# Patient Record
Sex: Female | Born: 1995 | Race: White | Hispanic: No | Marital: Married | Smoking: Former smoker
Health system: Southern US, Community
[De-identification: ages and names within clinical notes are randomized; demographics above are authoritative.]

## PROBLEM LIST (undated history)

## (undated) ENCOUNTER — Inpatient Hospital Stay (HOSPITAL_COMMUNITY): Payer: Self-pay

## (undated) DIAGNOSIS — D696 Thrombocytopenia, unspecified: Secondary | ICD-10-CM

## (undated) DIAGNOSIS — O99119 Other diseases of the blood and blood-forming organs and certain disorders involving the immune mechanism complicating pregnancy, unspecified trimester: Secondary | ICD-10-CM

## (undated) DIAGNOSIS — F419 Anxiety disorder, unspecified: Secondary | ICD-10-CM

## (undated) HISTORY — DX: Other diseases of the blood and blood-forming organs and certain disorders involving the immune mechanism complicating pregnancy, unspecified trimester: D69.6

## (undated) HISTORY — PX: NO PAST SURGERIES: SHX2092

## (undated) HISTORY — DX: Thrombocytopenia, unspecified: O99.119

## (undated) HISTORY — DX: Anxiety disorder, unspecified: F41.9

---
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
## 2013-05-25 ENCOUNTER — Encounter (HOSPITAL_COMMUNITY): Payer: Self-pay | Admitting: Emergency Medicine

## 2013-05-25 ENCOUNTER — Emergency Department (HOSPITAL_COMMUNITY)
Admission: EM | Admit: 2013-05-25 | Discharge: 2013-05-25 | Disposition: A | Discharge status: Home / Self Care | Payer: Medicaid Other | Attending: Emergency Medicine | Admitting: Emergency Medicine

## 2013-05-25 DIAGNOSIS — Z3202 Encounter for pregnancy test, result negative: Secondary | ICD-10-CM | POA: Insufficient documentation

## 2013-05-25 DIAGNOSIS — Z880 Allergy status to penicillin: Secondary | ICD-10-CM | POA: Insufficient documentation

## 2013-05-25 DIAGNOSIS — Z8659 Personal history of other mental and behavioral disorders: Secondary | ICD-10-CM | POA: Insufficient documentation

## 2013-05-25 DIAGNOSIS — F17200 Nicotine dependence, unspecified, uncomplicated: Secondary | ICD-10-CM | POA: Insufficient documentation

## 2013-05-25 DIAGNOSIS — R300 Dysuria: Secondary | ICD-10-CM | POA: Insufficient documentation

## 2013-05-25 DIAGNOSIS — N898 Other specified noninflammatory disorders of vagina: Secondary | ICD-10-CM | POA: Insufficient documentation

## 2013-05-25 DIAGNOSIS — T384X5A Adverse effect of oral contraceptives, initial encounter: Secondary | ICD-10-CM | POA: Insufficient documentation

## 2013-05-25 LAB — POCT PREGNANCY, URINE: Preg Test, Ur: NEGATIVE

## 2013-05-25 LAB — URINALYSIS, ROUTINE W REFLEX MICROSCOPIC
Bilirubin Urine: NEGATIVE
Glucose, UA: NEGATIVE mg/dL
Hgb urine dipstick: NEGATIVE
Ketones, ur: 15 mg/dL — AB
Leukocytes, UA: NEGATIVE
Nitrite: NEGATIVE
Protein, ur: NEGATIVE mg/dL
pH: 5.5 (ref 5.0–8.0)

## 2013-05-25 MED ORDER — IBUPROFEN 800 MG PO TABS
800.0000 mg | ORAL_TABLET | Freq: Once | ORAL | Status: AC
  Administered 2013-05-25: 800 mg via ORAL
  Filled 2013-05-25: qty 1

## 2013-05-25 MED ORDER — CIPROFLOXACIN HCL 500 MG PO TABS: 500.0000 mg | ORAL_TABLET | Freq: Two times a day (BID) | ORAL | Status: DC

## 2013-05-25 NOTE — ED Notes (Addendum)
abd pain, dysuria,vomiting., no fever or chills,  LMP now.  4 mos post partum.  Vag delivery.  Has had vag bleeding since received  Depo shot in Oct.

## 2013-05-25 NOTE — ED Provider Notes (Signed)
CSN: [id number]     Arrival date & time 05/25/13  1050 History  This chart was scribed for Benny Lennert, MD by Bennett Scrape, ED Scribe. This patient was seen in room APA01/APA01 and the patient's care was started at 12:04 PM.   Chief Complaint  Patient presents with  . Abdominal Pain    Patient is a 17 y.o. female presenting with abdominal pain. The history is provided by the patient. No language interpreter was used.  Abdominal Pain Pain location:  Suprapubic Pain radiates to:  Does not radiate Onset quality:  Gradual Duration: this morning. Timing:  Constant Chronicity:  New Associated symptoms: dysuria and vaginal bleeding   Associated symptoms: no chest pain, no chills, no cough, no diarrhea, no fatigue, no fever and no hematuria     HPI Comments: Marissa Friedman is a 17 y.o. female who presents to the Emergency Department complaining of constant suprapubic abdominal pain with associated dysuria and difficulty urinating that started this morning. She reports prior episodes of the same diagnosed as UTIs. She denies any fevers or chills. She recently had a vaginal birth 4 months ago. She denies any post delivery complications. She is currently on the depo injections but is having vaginal bleeding from it since last month.   Past Medical History  Diagnosis Date  . Anxiety    History reviewed. No pertinent past surgical history. Family History  Problem Relation Age of Onset  . Diabetes Other   . Hypertension Other   . Mental illness Other   . Stroke Other   . Coronary artery disease Other    History  Substance Use Topics  . Smoking status: Current Every Day Smoker -- 0.50 packs/day    Types: Cigarettes  . Smokeless tobacco: Not on file     Comment: Decreased to 3 cigarettes a day.  . Alcohol Use: No     Comment: occasional. None since +preg test.   OB History   Grav Para Term Preterm Abortions TAB SAB Ect Mult Living   1              Review of Systems   Constitutional: Negative for fever, chills, appetite change and fatigue.  HENT: Negative for congestion, ear discharge and sinus pressure.   Eyes: Negative for discharge.  Respiratory: Negative for cough.   Cardiovascular: Negative for chest pain.  Gastrointestinal: Positive for abdominal pain. Negative for diarrhea.  Genitourinary: Positive for dysuria, vaginal bleeding and difficulty urinating. Negative for frequency and hematuria.  Musculoskeletal: Negative for back pain.  Skin: Negative for rash.  Neurological: Negative for seizures and headaches.  Psychiatric/Behavioral: Negative for hallucinations.    Allergies  Penicillins and Wellbutrin  Home Medications   Current Outpatient Rx  Name  Route  Sig  Dispense  Refill  . Prenatal Vit-Fe Fumarate-FA (PRENATAL VITAMINS) 28-0.8 MG TABS   Oral   Take 1 tablet by mouth daily.   30 tablet   3   . EXPIRED: promethazine (PHENERGAN) 25 MG tablet   Oral   Take 1 tablet (25 mg total) by mouth every 6 (six) hours as needed for nausea.   12 tablet   0    Triage Vitals: BP 101/58  Pulse 73  Temp(Src) 98 F (36.7 C) (Oral)  Resp 20  Ht 5\' 4"  (1.626 m)  Wt 130 lb (58.968 kg)  BMI 22.30 kg/m2  SpO2 93%  LMP 04/24/2013  Breastfeeding? Unknown  Physical Exam  Nursing note and vitals reviewed. Constitutional: She  is oriented to person, place, and time. She appears well-developed and well-nourished.  HENT:  Head: Normocephalic and atraumatic.  Eyes: Conjunctivae and EOM are normal. No scleral icterus.  Neck: Neck supple. No thyromegaly present.  Cardiovascular: Normal rate and regular rhythm.  Exam reveals no gallop and no friction rub.   No murmur heard. Pulmonary/Chest: Effort normal and breath sounds normal. No stridor. She has no wheezes. She has no rales. She exhibits no tenderness.  Abdominal: She exhibits no distension. There is tenderness (mild suprapubic tenderness ). There is no rebound.  Musculoskeletal: Normal  range of motion. She exhibits no edema.  Lymphadenopathy:    She has no cervical adenopathy.  Neurological: She is alert and oriented to person, place, and time. She exhibits normal muscle tone. Coordination normal.  Skin: Skin is warm and dry. No rash noted. No erythema.  Psychiatric: She has a normal mood and affect. Her behavior is normal.    ED Course  Procedures (including critical care time)  DIAGNOSTIC STUDIES: Oxygen Saturation is 93% on RA, adequate by my interpretation.    COORDINATION OF CARE: 12:05 PM-Discussed treatment plan which includes UA with pt at bedside and pt agreed to plan.   Labs Review Labs Reviewed - No data to display Imaging Review No results found.  EKG Interpretation   None       MDM  Dysuria,   Will tx with cipro and have her follow up with gyn      The chart was scribed for me under my direct supervision.  I personally performed the history, physical, and medical decision making and all procedures in the evaluation of this patient.Benny Lennert, MD 05/25/13 [phone]

## 2013-05-25 NOTE — ED Notes (Signed)
Reports onset of suprapubic pain, burning with urination, onset this morning; also reports n/v onset this morning - states, "I was throwing up acid".  States suprapubic pain is constant, and rates 9/10 presently. Also reports vaginal bleeding since October of this year after receiving Depo shot.

## 2013-05-27 LAB — URINE CULTURE: Colony Count: 10000

## 2013-05-28 ENCOUNTER — Telehealth (HOSPITAL_COMMUNITY): Payer: Self-pay | Admitting: Emergency Medicine

## 2013-05-28 NOTE — ED Notes (Signed)
Post ED Visit - Positive Culture Follow-up  Culture report reviewed by antimicrobial stewardship pharmacist: []  Wes Dulaney, Pharm.D., BCPS []  Celedonio Miyamoto, Pharm.D., BCPS [x]  Georgina Pillion, Pharm.D., BCPS []  [place], [place].D., BCPS, AAHIVP []  Estella Husk, Pharm.D., BCPS, AAHIVP  Positive urine culture Treated with Cipro, organism sensitive to the same and no further patient follow-up is required at this time.  Kylie A Holland 05/28/2013, 11:39 AM

## 2013-09-14 ENCOUNTER — Emergency Department (HOSPITAL_COMMUNITY)
Admission: EM | Admit: 2013-09-14 | Discharge: 2013-09-14 | Disposition: A | Discharge status: Home / Self Care | Payer: Medicaid Other | Attending: Emergency Medicine | Admitting: Emergency Medicine

## 2013-09-14 ENCOUNTER — Encounter (HOSPITAL_COMMUNITY): Payer: Self-pay | Admitting: Emergency Medicine

## 2013-09-14 DIAGNOSIS — Z8659 Personal history of other mental and behavioral disorders: Secondary | ICD-10-CM | POA: Insufficient documentation

## 2013-09-14 DIAGNOSIS — Z3202 Encounter for pregnancy test, result negative: Secondary | ICD-10-CM | POA: Insufficient documentation

## 2013-09-14 DIAGNOSIS — Z880 Allergy status to penicillin: Secondary | ICD-10-CM | POA: Insufficient documentation

## 2013-09-14 DIAGNOSIS — F17200 Nicotine dependence, unspecified, uncomplicated: Secondary | ICD-10-CM | POA: Insufficient documentation

## 2013-09-14 DIAGNOSIS — N390 Urinary tract infection, site not specified: Secondary | ICD-10-CM | POA: Insufficient documentation

## 2013-09-14 LAB — URINE MICROSCOPIC-ADD ON

## 2013-09-14 LAB — URINALYSIS, ROUTINE W REFLEX MICROSCOPIC
Bilirubin Urine: NEGATIVE
Glucose, UA: NEGATIVE mg/dL
Hgb urine dipstick: NEGATIVE
Ketones, ur: 15 mg/dL — AB
Nitrite: POSITIVE — AB
Specific Gravity, Urine: >1.03 — ABNORMAL HIGH (ref 1.005–1.030)
Urobilinogen, UA: 2 mg/dL — ABNORMAL HIGH (ref 0.0–1.0)
pH: 5.5 (ref 5.0–8.0)

## 2013-09-14 LAB — PREGNANCY, URINE: Preg Test, Ur: NEGATIVE

## 2013-09-14 MED ORDER — IBUPROFEN 800 MG PO TABS
ORAL_TABLET | ORAL | Status: AC
  Filled 2013-09-14: qty 1

## 2013-09-14 MED ORDER — CIPROFLOXACIN HCL 250 MG PO TABS
ORAL_TABLET | ORAL | Status: AC
  Administered 2013-09-14: 500 mg
  Filled 2013-09-14: qty 2

## 2013-09-14 MED ORDER — CIPROFLOXACIN HCL 500 MG PO TABS: 500.0000 mg | ORAL_TABLET | Freq: Two times a day (BID) | ORAL | Status: DC

## 2013-09-14 MED ORDER — IBUPROFEN 800 MG PO TABS
800.0000 mg | ORAL_TABLET | Freq: Once | ORAL | Status: AC
  Administered 2013-09-14: 800 mg via ORAL

## 2013-09-14 NOTE — ED Provider Notes (Signed)
CSN: [id number]     Arrival date & time 09/14/13  2044 History   First MD Initiated Contact with Patient 09/14/13 2114     Chief Complaint  Patient presents with  . Generalized Body Aches     (Consider location/radiation/quality/duration/timing/severity/associated sxs/prior Treatment) Patient is a 18 y.o. female presenting with dysuria. The history is provided by the patient. No language interpreter was used.  Dysuria Pain quality:  Aching Pain severity:  Moderate Progression:  Waxing and waning Associated symptoms: no fever, no nausea and no vomiting   Associated symptoms comment:  Suprapubic pain Risk factors: recurrent urinary tract infections    Patient is a 18 year old female who presents with lower abdominal pain. She reports that she hurts all across her lower abdomen but mostly over the area of her bladder. She reports that she has had recurrent urinary tract infections before and she is having some pain into her left back area. She denies fever, chills, nausea or vomiting. She reports that she had similar symptoms before and whatever they gave her the last time worked.   Past Medical History  Diagnosis Date  . Anxiety    History reviewed. No pertinent past surgical history. Family History  Problem Relation Age of Onset  . Diabetes Other   . Hypertension Other   . Mental illness Other   . Stroke Other   . Coronary artery disease Other    History  Substance Use Topics  . Smoking status: Current Every Day Smoker -- 0.50 packs/day    Types: Cigarettes  . Smokeless tobacco: Not on file     Comment: Decreased to 3 cigarettes a day.  . Alcohol Use: No     Comment: occasional. None since +preg test.   OB History   Grav Para Term Preterm Abortions TAB SAB Ect Mult Living   1              Review of Systems  Constitutional: Negative for fever.  Gastrointestinal: Negative for nausea and vomiting.  Genitourinary: Positive for dysuria.  Musculoskeletal: Positive for  back pain.  Skin: Negative for rash.  All other systems reviewed and are negative.      Allergies  Penicillins and Wellbutrin  Home Medications   Current Outpatient Rx  Name  Route  Sig  Dispense  Refill  . ciprofloxacin (CIPRO) 500 MG tablet   Oral   Take 1 tablet (500 mg total) by mouth 2 (two) times daily.   14 tablet   0    BP 117/64  Temp(Src) 99.8 F (37.7 C) (Oral)  Resp 20  Ht 5\' 4"  (1.626 m)  Wt 140 lb (63.504 kg)  BMI 24.02 kg/m2  SpO2 97%  LMP 08/24/2013 Physical Exam  Nursing note and vitals reviewed. Constitutional: She is oriented to person, place, and time. She appears well-developed and well-nourished. No distress.  HENT:  Head: Normocephalic and atraumatic.  Eyes: Conjunctivae and EOM are normal.  Neck: Normal range of motion. Neck supple. No JVD present. No tracheal deviation present. No thyromegaly present.  Cardiovascular: Normal rate, regular rhythm and normal heart sounds.   Pulmonary/Chest: Effort normal and breath sounds normal. No respiratory distress. She has no wheezes.  Abdominal: Soft. Bowel sounds are normal. There is tenderness in the suprapubic area. There is no rebound, no guarding and no CVA tenderness.  Musculoskeletal: Normal range of motion.  Lymphadenopathy:    She has no cervical adenopathy.  Neurological: She is alert and oriented to person, place, and time. No  cranial nerve deficit. Coordination normal.  Skin: Skin is warm and dry. No rash noted. No erythema.  Psychiatric: She has a normal mood and affect. Her behavior is normal. Judgment and thought content normal.    ED Course  Procedures (including critical care time) Labs Review Labs Reviewed  URINALYSIS, ROUTINE W REFLEX MICROSCOPIC - Abnormal; Notable for the following:    Specific Gravity, Urine >1.030 (*)    Ketones, ur 15 (*)    Protein, ur TRACE (*)    Urobilinogen, UA 2.0 (*)    Nitrite POSITIVE (*)    Leukocytes, UA TRACE (*)    All other components  within normal limits  URINE MICROSCOPIC-ADD ON - Abnormal; Notable for the following:    Squamous Epithelial / LPF FEW (*)    Bacteria, UA FEW (*)    All other components within normal limits  PREGNANCY, URINE   Imaging Review No results found.   EKG Interpretation None      MDM   Final diagnoses:  UTI (lower urinary tract infection)    Denies fever, chills, nausea or vomiting. Feeling some relief with ibuprofen. Reports that this feels like a UTI. Urinalysis; Nitrite + with leukocytes. Will treat for UTI. Discussed plan of care with pt and she agrees. Prescription for Ciprofloxacin given. Return precautions given.      Irish EldersKelly Cortni Tays, NP 09/18/13 [phone]

## 2013-09-14 NOTE — Discharge Instructions (Signed)
Urinary Tract Infection Urinary tract infections (UTIs) can develop anywhere along your urinary tract. Your urinary tract is your body's drainage system for removing wastes and extra water. Your urinary tract includes two kidneys, two ureters, a bladder, and a urethra. Your kidneys are a pair of bean-shaped organs. Each kidney is about the size of your fist. They are located below your ribs, one on each side of your spine. CAUSES Infections are caused by microbes, which are microscopic organisms, including fungi, viruses, and bacteria. These organisms are so small that they can only be seen through a microscope. Bacteria are the microbes that most commonly cause UTIs. SYMPTOMS  Symptoms of UTIs may vary by age and gender of the patient and by the location of the infection. Symptoms in young women typically include a frequent and intense urge to urinate and a painful, burning feeling in the bladder or urethra during urination. Older women and men are more likely to be tired, shaky, and weak and have muscle aches and abdominal pain. A fever may mean the infection is in your kidneys. Other symptoms of a kidney infection include pain in your back or sides below the ribs, nausea, and vomiting. DIAGNOSIS To diagnose a UTI, your caregiver will ask you about your symptoms. Your caregiver also will ask to provide a urine sample. The urine sample will be tested for bacteria and white blood cells. White blood cells are made by your body to help fight infection. TREATMENT  Typically, UTIs can be treated with medication. Because most UTIs are caused by a bacterial infection, they usually can be treated with the use of antibiotics. The choice of antibiotic and length of treatment depend on your symptoms and the type of bacteria causing your infection. HOME CARE INSTRUCTIONS  If you were prescribed antibiotics, take them exactly as your caregiver instructs you. Finish the medication even if you feel better after you  have only taken some of the medication.  Drink enough water and fluids to keep your urine clear or pale yellow.  Avoid caffeine, tea, and carbonated beverages. They tend to irritate your bladder.  Empty your bladder often. Avoid holding urine for long periods of time.  Empty your bladder before and after sexual intercourse.  After a bowel movement, women should cleanse from front to back. Use each tissue only once. SEEK MEDICAL CARE IF:   You have back pain.  You develop a fever.  Your symptoms do not begin to resolve within 3 days. SEEK IMMEDIATE MEDICAL CARE IF:   You have severe back pain or lower abdominal pain.  You develop chills.  You have nausea or vomiting.  You have continued burning or discomfort with urination. MAKE SURE YOU:   Understand these instructions.  Will watch your condition.  Will get help right away if you are not doing well or get worse. Document Released: 03/28/2005 Document Revised: 12/18/2011 Document Reviewed: 07/27/2011 [facility] Patient Information 2014 [place], [place].   Increase oral fluid intake Drink cranberry juice Take antibiotic as prescribed May take ibuprofen or Tylenol for discomfort Return if fever, nausea, vomiting or worsening back pain

## 2013-09-14 NOTE — ED Notes (Signed)
Getting light headed. Hurting in my waist and in my left side, hurting all over, head hurts too. It comes and goes per pt. Thought it may be a uti but I am hurting all over now.

## 2013-09-19 NOTE — ED Provider Notes (Signed)
Medical screening examination/treatment/procedure(s) were performed by non-physician practitioner and as supervising physician I was immediately available for consultation/collaboration.   EKG Interpretation None       Donnetta HutchingBrian Jorey Dollard, MD 09/19/13 [phone]

## 2014-05-03 ENCOUNTER — Encounter (HOSPITAL_COMMUNITY): Payer: Self-pay | Admitting: Emergency Medicine

## 2014-05-15 ENCOUNTER — Emergency Department (HOSPITAL_COMMUNITY)
Admission: EM | Admit: 2014-05-15 | Discharge: 2014-05-15 | Disposition: A | Discharge status: Home / Self Care | Payer: Medicaid Other | Attending: Emergency Medicine | Admitting: Emergency Medicine

## 2014-05-15 ENCOUNTER — Emergency Department (HOSPITAL_COMMUNITY): Payer: Medicaid Other

## 2014-05-15 DIAGNOSIS — Y9289 Other specified places as the place of occurrence of the external cause: Secondary | ICD-10-CM | POA: Insufficient documentation

## 2014-05-15 DIAGNOSIS — Y9389 Activity, other specified: Secondary | ICD-10-CM | POA: Insufficient documentation

## 2014-05-15 DIAGNOSIS — Y998 Other external cause status: Secondary | ICD-10-CM | POA: Diagnosis not present

## 2014-05-15 DIAGNOSIS — Z720 Tobacco use: Secondary | ICD-10-CM | POA: Insufficient documentation

## 2014-05-15 DIAGNOSIS — F419 Anxiety disorder, unspecified: Secondary | ICD-10-CM | POA: Diagnosis not present

## 2014-05-15 DIAGNOSIS — W458XXA Other foreign body or object entering through skin, initial encounter: Secondary | ICD-10-CM | POA: Insufficient documentation

## 2014-05-15 DIAGNOSIS — Z880 Allergy status to penicillin: Secondary | ICD-10-CM | POA: Diagnosis not present

## 2014-05-15 DIAGNOSIS — S90851S Superficial foreign body, right foot, sequela: Secondary | ICD-10-CM

## 2014-05-15 DIAGNOSIS — Z792 Long term (current) use of antibiotics: Secondary | ICD-10-CM | POA: Insufficient documentation

## 2014-05-15 DIAGNOSIS — S90851A Superficial foreign body, right foot, initial encounter: Secondary | ICD-10-CM | POA: Diagnosis present

## 2014-05-15 DIAGNOSIS — M795 Residual foreign body in soft tissue: Secondary | ICD-10-CM

## 2014-05-15 DIAGNOSIS — Z23 Encounter for immunization: Secondary | ICD-10-CM | POA: Diagnosis not present

## 2014-05-15 MED ORDER — LIDOCAINE HCL (PF) 1 % IJ SOLN: 10.0000 mL | Freq: Once | INTRAMUSCULAR | Status: DC

## 2014-05-15 MED ORDER — SULFAMETHOXAZOLE-TRIMETHOPRIM 800-160 MG PO TABS: 1.0000 | ORAL_TABLET | Freq: Two times a day (BID) | ORAL | Status: AC

## 2014-05-15 MED ORDER — DOXYCYCLINE HYCLATE 100 MG PO TABS
100.0000 mg | ORAL_TABLET | Freq: Once | ORAL | Status: AC
  Administered 2014-05-15: 100 mg via ORAL
  Filled 2014-05-15: qty 1

## 2014-05-15 MED ORDER — TETANUS-DIPHTH-ACELL PERTUSSIS 5-2.5-18.5 LF-MCG/0.5 IM SUSP
0.5000 mL | Freq: Once | INTRAMUSCULAR | Status: AC
  Administered 2014-05-15: 0.5 mL via INTRAMUSCULAR
  Filled 2014-05-15: qty 0.5

## 2014-05-15 MED ORDER — HYDROCODONE-ACETAMINOPHEN 5-325 MG PO TABS
2.0000 | ORAL_TABLET | Freq: Once | ORAL | Status: AC
  Administered 2014-05-15: 2 via ORAL
  Filled 2014-05-15: qty 2

## 2014-05-15 MED ORDER — PROMETHAZINE HCL 12.5 MG PO TABS
12.5000 mg | ORAL_TABLET | Freq: Once | ORAL | Status: AC
  Administered 2014-05-15: 12.5 mg via ORAL
  Filled 2014-05-15: qty 1

## 2014-05-15 MED ORDER — LIDOCAINE HCL (PF) 2 % IJ SOLN
INTRAMUSCULAR | Status: AC
  Administered 2014-05-15: 22:00:00
  Filled 2014-05-15: qty 10

## 2014-05-15 NOTE — ED Provider Notes (Signed)
CSN: [id number]     Arrival date & time 05/15/14  2051 History   First MD Initiated Contact with Patient 05/15/14 2059     Chief Complaint  Patient presents with  . Foreign Body     (Consider location/radiation/quality/duration/timing/severity/associated sxs/prior Treatment) HPI Comments: Patient is a 18 year old female who presents to the emergency department with a foreign body in the right foot.   Patient is a 18 y.o. female presenting with foreign body. The history is provided by the patient.  Foreign Body Location:  Skin (bottom of the right foot) Suspected object:  Metal Pain quality:  Sharp, shooting and throbbing Pain severity:  Severe Duration:  1 hour Timing:  Constant Progression:  Worsening Chronicity:  New Worsened by:  Nothing tried Ineffective treatments:  None tried Associated symptoms: no nausea   Risk factors: no prior similar events and no prior surgery to area     Past Medical History  Diagnosis Date  . Anxiety    No past surgical history on file. Family History  Problem Relation Age of Onset  . Diabetes Other   . Hypertension Other   . Mental illness Other   . Stroke Other   . Coronary artery disease Other    History  Substance Use Topics  . Smoking status: Current Every Day Smoker -- 0.50 packs/day    Types: Cigarettes  . Smokeless tobacco: Not on file     Comment: Decreased to 3 cigarettes a day.  . Alcohol Use: No     Comment: occasional. None since +preg test.   OB History    Gravida Para Term Preterm AB TAB SAB Ectopic Multiple Living   1              Review of Systems  Constitutional: Negative for activity change.       All ROS Neg except as noted in HPI  Eyes: Negative for photophobia and discharge.  Respiratory: Negative for shortness of breath and wheezing.   Cardiovascular: Negative for chest pain and palpitations.  Gastrointestinal: Negative for nausea and blood in stool.  Genitourinary: Negative for dysuria, frequency  and hematuria.  Musculoskeletal: Negative for back pain, arthralgias and neck pain.  Skin: Negative.   Neurological: Negative for dizziness, seizures, weakness and numbness.  Psychiatric/Behavioral: Negative for hallucinations and confusion. The patient is nervous/anxious.   All other systems reviewed and are negative.     Allergies  Penicillins and Wellbutrin  Home Medications   Prior to Admission medications   Medication Sig Start Date End Date Taking? Authorizing Provider  ciprofloxacin (CIPRO) 500 MG tablet Take 1 tablet (500 mg total) by mouth 2 (two) times daily. 09/14/13   Irish EldersKelly Walker, NP   BP 118/73 mmHg  Pulse 101  Temp(Src) 98.1 F (36.7 C) (Oral)  Resp 16  Ht 5\' 4"  (1.626 m)  Wt 155 lb (70.308 kg)  BMI 26.59 kg/m2  SpO2 99%  LMP 05/14/2014 Physical Exam  Constitutional: She is oriented to person, place, and time. She appears well-developed and well-nourished.  Non-toxic appearance.  HENT:  Head: Normocephalic.  Right Ear: Tympanic membrane and external ear normal.  Left Ear: Tympanic membrane and external ear normal.  Eyes: EOM and lids are normal. Pupils are equal, round, and reactive to light.  Neck: Normal range of motion. Neck supple. Carotid bruit is not present.  Cardiovascular: Normal rate, regular rhythm, normal heart sounds, intact distal pulses and normal pulses.   Pulmonary/Chest: Breath sounds normal. No respiratory distress.  Abdominal: Soft.  Bowel sounds are normal. There is no tenderness. There is no guarding.  Musculoskeletal: Normal range of motion.       Right foot: There is tenderness. There is normal range of motion, no bony tenderness, no swelling, normal capillary refill, no crepitus and no deformity.  Foreign body (nail) between the 2nd and 3rd MP joint at the plantar surface.  Lymphadenopathy:       Head (right side): No submandibular adenopathy present.       Head (left side): No submandibular adenopathy present.    She has no cervical  adenopathy.  Neurological: She is alert and oriented to person, place, and time. She has normal strength. No cranial nerve deficit or sensory deficit.  Skin: Skin is warm and dry.  Psychiatric: She has a normal mood and affect. Her speech is normal.  Nursing note and vitals reviewed.   ED Course  FOREIGN BODY REMOVAL Date/Time: 05/15/2014 9:48 PM Performed by: Kathie DikeBRYANT, Vola Beneke M Authorized by: Kathie DikeBRYANT, Maryem Shuffler M Consent: Verbal consent obtained. Risks and benefits: risks, benefits and alternatives were discussed Consent given by: patient Patient understanding: patient states understanding of the procedure being performed Required items: required blood products, implants, devices, and special equipment available Patient identity confirmed: arm band Time out: Immediately prior to procedure a "time out" was called to verify the correct patient, procedure, equipment, support staff and site/side marked as required. Body area: skin General location: lower extremity Location details: right foot Anesthesia: local infiltration Local anesthetic: lidocaine 2% without epinephrine Patient sedated: no Patient restrained: no Patient cooperative: yes Localization method: visualized Removal mechanism: forceps and hemostat Dressing: antibiotic ointment and dressing applied Tendon involvement: none Depth: subcutaneous Complexity: simple 1 objects recovered. Objects recovered: nail Post-procedure assessment: foreign body removed Patient tolerance: Patient tolerated the procedure well with no immediate complications Comments: Sterile dressing applied by me.   (including critical care time) Labs Review Labs Reviewed - No data to display  Imaging Review No results found.   EKG Interpretation None      MDM  Pt acquired a foreign body  Of the dorsum of the right foot. FB removed without problem. No bone or tendon involvement. Tetanus updated, and pt started on antibiotics. Pt to see PCP  or return to the ED if any changes or problem.   Final diagnoses:  Foreign body in foot, right, sequela    *I have reviewed nursing notes, vital signs, and all appropriate lab and imaging results for this patient.Kathie Dike*    Emmani Lesueur M Keland Peyton, PA-C 05/17/14 2203  Doug SouSam Jacubowitz, MD 05/20/14 [phone]

## 2014-05-15 NOTE — Discharge Instructions (Signed)
Please soak your foot in warm Salt Water for the Next 5 Days. Please use Bactrim 2 times daily after a meal. Please change Band-Aid daily to the puncture site. Please see your primary physician or return to the emergency department if any pus like drainage, red streaks going up the foot, or high fever.

## 2014-05-15 NOTE — ED Notes (Signed)
Patient stepped on "a tack" while in process of moving. Patient does not know how long the tack is .

## 2014-07-07 ENCOUNTER — Emergency Department (HOSPITAL_COMMUNITY)
Admission: EM | Admit: 2014-07-07 | Discharge: 2014-07-07 | Disposition: A | Discharge status: Home / Self Care | Payer: Medicaid Other | Attending: Emergency Medicine | Admitting: Emergency Medicine

## 2014-07-07 ENCOUNTER — Encounter (HOSPITAL_COMMUNITY): Payer: Self-pay

## 2014-07-07 DIAGNOSIS — Z720 Tobacco use: Secondary | ICD-10-CM | POA: Diagnosis not present

## 2014-07-07 DIAGNOSIS — Z880 Allergy status to penicillin: Secondary | ICD-10-CM | POA: Diagnosis not present

## 2014-07-07 DIAGNOSIS — Z8659 Personal history of other mental and behavioral disorders: Secondary | ICD-10-CM | POA: Diagnosis not present

## 2014-07-07 DIAGNOSIS — R112 Nausea with vomiting, unspecified: Secondary | ICD-10-CM | POA: Insufficient documentation

## 2014-07-07 DIAGNOSIS — Z331 Pregnant state, incidental: Secondary | ICD-10-CM | POA: Insufficient documentation

## 2014-07-07 DIAGNOSIS — R1084 Generalized abdominal pain: Secondary | ICD-10-CM | POA: Insufficient documentation

## 2014-07-07 DIAGNOSIS — Z3490 Encounter for supervision of normal pregnancy, unspecified, unspecified trimester: Secondary | ICD-10-CM

## 2014-07-07 LAB — URINALYSIS, ROUTINE W REFLEX MICROSCOPIC
Bilirubin Urine: NEGATIVE
GLUCOSE, UA: NEGATIVE mg/dL
HGB URINE DIPSTICK: NEGATIVE
KETONES UR: NEGATIVE mg/dL
Nitrite: NEGATIVE
PROTEIN: NEGATIVE mg/dL
SPECIFIC GRAVITY, URINE: 1.025 (ref 1.005–1.030)
Urobilinogen, UA: 1 mg/dL (ref 0.0–1.0)
pH: 6 (ref 5.0–8.0)

## 2014-07-07 LAB — URINE MICROSCOPIC-ADD ON

## 2014-07-07 LAB — COMPREHENSIVE METABOLIC PANEL
ALK PHOS: 78 U/L (ref 39–117)
ALT: 16 U/L (ref 0–35)
AST: 20 U/L (ref 0–37)
Albumin: 4 g/dL (ref 3.5–5.2)
Anion gap: 6 (ref 5–15)
BUN: 7 mg/dL (ref 6–23)
CALCIUM: 8.9 mg/dL (ref 8.4–10.5)
CO2: 24 mmol/L (ref 19–32)
CREATININE: 0.55 mg/dL (ref 0.50–1.10)
Chloride: 106 mEq/L (ref 96–112)
GFR calc Af Amer: >90 mL/min (ref >90)
GFR calc non Af Amer: >90 mL/min (ref >90)
Glucose, Bld: 105 mg/dL — ABNORMAL HIGH (ref 70–99)
POTASSIUM: 3.3 mmol/L — AB (ref 3.5–5.1)
Sodium: 136 mmol/L (ref 135–145)
TOTAL PROTEIN: 6.7 g/dL (ref 6.0–8.3)
Total Bilirubin: 0.4 mg/dL (ref 0.3–1.2)

## 2014-07-07 LAB — CBC WITH DIFFERENTIAL/PLATELET
BASOS ABS: 0 10*3/uL (ref 0.0–0.1)
BASOS PCT: 0 % (ref 0–1)
Eosinophils Absolute: 0.2 10*3/uL (ref 0.0–0.7)
Eosinophils Relative: 4 % (ref 0–5)
HCT: 39.6 % (ref 36.0–46.0)
Hemoglobin: 13.7 g/dL (ref 12.0–15.0)
Lymphocytes Relative: 28 % (ref 12–46)
Lymphs Abs: 1.6 10*3/uL (ref 0.7–4.0)
MCH: 30.4 pg (ref 26.0–34.0)
MCHC: 34.6 g/dL (ref 30.0–36.0)
MCV: 88 fL (ref 78.0–100.0)
MONO ABS: 0.5 10*3/uL (ref 0.1–1.0)
Monocytes Relative: 9 % (ref 3–12)
Neutro Abs: 3.3 10*3/uL (ref 1.7–7.7)
Neutrophils Relative %: 59 % (ref 43–77)
Platelets: 154 10*3/uL (ref 150–400)
RBC: 4.5 MIL/uL (ref 3.87–5.11)
RDW: 13.3 % (ref 11.5–15.5)
WBC: 5.6 10*3/uL (ref 4.0–10.5)

## 2014-07-07 LAB — PREGNANCY, URINE: Preg Test, Ur: POSITIVE — AB

## 2014-07-07 MED ORDER — ONDANSETRON HCL 8 MG PO TABS: 8.0000 mg | ORAL_TABLET | Freq: Three times a day (TID) | ORAL | Status: DC | PRN

## 2014-07-07 MED ORDER — ONDANSETRON HCL 4 MG/2ML IJ SOLN
4.0000 mg | Freq: Once | INTRAMUSCULAR | Status: AC
  Administered 2014-07-07: 4 mg via INTRAVENOUS
  Filled 2014-07-07: qty 2

## 2014-07-07 MED ORDER — SODIUM CHLORIDE 0.9 % IV BOLUS (SEPSIS)
1000.0000 mL | Freq: Once | INTRAVENOUS | Status: AC
  Administered 2014-07-07: 1000 mL via INTRAVENOUS

## 2014-07-07 NOTE — Discharge Instructions (Signed)
First Trimester of Pregnancy °The first trimester of pregnancy is from week 1 until the end of week 12 (months 1 through 3). A week after a sperm fertilizes an egg, the egg will implant on the wall of the uterus. This embryo will begin to develop into a baby. Genes from you and your partner are forming the baby. The female genes determine whether the baby is a boy or a girl. At 6-8 weeks, the eyes and face are formed, and the heartbeat can be seen on ultrasound. At the end of 12 weeks, all the baby's organs are formed.  °Now that you are pregnant, you will want to do everything you can to have a healthy baby. Two of the most important things are to get good prenatal care and to follow your health care provider's instructions. Prenatal care is all the medical care you receive before the baby's birth. This care will help prevent, find, and treat any problems during the pregnancy and childbirth. °BODY CHANGES °Your body goes through many changes during pregnancy. The changes vary from woman to woman.  °· You may gain or lose a couple of pounds at first. °· You may feel sick to your stomach (nauseous) and throw up (vomit). If the vomiting is uncontrollable, call your health care provider. °· You may tire easily. °· You may develop headaches that can be relieved by medicines approved by your health care provider. °· You may urinate more often. Painful urination may mean you have a bladder infection. °· You may develop heartburn as a result of your pregnancy. °· You may develop constipation because certain hormones are causing the muscles that push waste through your intestines to slow down. °· You may develop hemorrhoids or swollen, bulging veins (varicose veins). °· Your breasts may begin to grow larger and become tender. Your nipples may stick out more, and the tissue that surrounds them (areola) may become darker. °· Your gums may bleed and may be sensitive to brushing and flossing. °· Dark spots or blotches (chloasma,  mask of pregnancy) may develop on your face. This will likely fade after the baby is born. °· Your menstrual periods will stop. °· You may have a loss of appetite. °· You may develop cravings for certain kinds of food. °· You may have changes in your emotions from day to day, such as being excited to be pregnant or being concerned that something may go wrong with the pregnancy and baby. °· You may have more vivid and strange dreams. °· You may have changes in your hair. These can include thickening of your hair, rapid growth, and changes in texture. Some women also have hair loss during or after pregnancy, or hair that feels dry or thin. Your hair will most likely return to normal after your baby is born. °WHAT TO EXPECT AT YOUR PRENATAL VISITS °During a routine prenatal visit: °· You will be weighed to make sure you and the baby are growing normally. °· Your blood pressure will be taken. °· Your abdomen will be measured to track your baby's growth. °· The fetal heartbeat will be listened to starting around week 10 or 12 of your pregnancy. °· Test results from any previous visits will be discussed. °Your health care provider may ask you: °· How you are feeling. °· If you are feeling the baby move. °· If you have had any abnormal symptoms, such as leaking fluid, bleeding, severe headaches, or abdominal cramping. °· If you have any questions. °Other tests   that may be performed during your first trimester include: °· Blood tests to find your blood type and to check for the presence of any previous infections. They will also be used to check for low iron levels (anemia) and Rh antibodies. Later in the pregnancy, blood tests for diabetes will be done along with other tests if problems develop. °· Urine tests to check for infections, diabetes, or protein in the urine. °· An ultrasound to confirm the proper growth and development of the baby. °· An amniocentesis to check for possible genetic problems. °· Fetal screens for  spina bifida and Down syndrome. °· You may need other tests to make sure you and the baby are doing well. °HOME CARE INSTRUCTIONS  °Medicines °· Follow your health care provider's instructions regarding medicine use. Specific medicines may be either safe or unsafe to take during pregnancy. °· Take your prenatal vitamins as directed. °· If you develop constipation, try taking a stool softener if your health care provider approves. °Diet °· Eat regular, well-balanced meals. Choose a variety of foods, such as meat or vegetable-based protein, fish, milk and low-fat dairy products, vegetables, fruits, and whole grain breads and cereals. Your health care provider will help you determine the amount of weight gain that is right for you. °· Avoid raw meat and uncooked cheese. These carry germs that can cause birth defects in the baby. °· Eating four or five small meals rather than three large meals a day may help relieve nausea and vomiting. If you start to feel nauseous, eating a few soda crackers can be helpful. Drinking liquids between meals instead of during meals also seems to help nausea and vomiting. °· If you develop constipation, eat more high-fiber foods, such as fresh vegetables or fruit and whole grains. Drink enough fluids to keep your urine clear or pale yellow. °Activity and Exercise °· Exercise only as directed by your health care provider. Exercising will help you: °¨ Control your weight. °¨ Stay in shape. °¨ Be prepared for labor and delivery. °· Experiencing pain or cramping in the lower abdomen or low back is a good sign that you should stop exercising. Check with your health care provider before continuing normal exercises. °· Try to avoid standing for long periods of time. Move your legs often if you must stand in one place for a long time. °· Avoid heavy lifting. °· Wear low-heeled shoes, and practice good posture. °· You may continue to have sex unless your health care provider directs you  otherwise. °Relief of Pain or Discomfort °· Wear a good support bra for breast tenderness.   °· Take warm sitz baths to soothe any pain or discomfort caused by hemorrhoids. Use hemorrhoid cream if your health care provider approves.   °· Rest with your legs elevated if you have leg cramps or low back pain. °· If you develop varicose veins in your legs, wear support hose. Elevate your feet for 15 minutes, 3-4 times a day. Limit salt in your diet. °Prenatal Care °· Schedule your prenatal visits by the twelfth week of pregnancy. They are usually scheduled monthly at first, then more often in the last 2 months before delivery. °· Write down your questions. Take them to your prenatal visits. °· Keep all your prenatal visits as directed by your health care provider. °Safety °· Wear your seat belt at all times when driving. °· Make a list of emergency phone numbers, including numbers for family, friends, the hospital, and police and fire departments. °General Tips °·   Ask your health care provider for a referral to a local prenatal education class. Begin classes no later than at the beginning of month 6 of your pregnancy.  Ask for help if you have counseling or nutritional needs during pregnancy. Your health care provider can offer advice or refer you to specialists for help with various needs.  Do not use hot tubs, steam rooms, or saunas.  Do not douche or use tampons or scented sanitary pads.  Do not cross your legs for long periods of time.  Avoid cat litter boxes and soil used by cats. These carry germs that can cause birth defects in the baby and possibly loss of the fetus by miscarriage or stillbirth.  Avoid all smoking, herbs, alcohol, and medicines not prescribed by your health care provider. Chemicals in these affect the formation and growth of the baby.  Schedule a dentist appointment. At home, brush your teeth with a soft toothbrush and be gentle when you floss. SEEK MEDICAL CARE IF:   You have  dizziness.  You have mild pelvic cramps, pelvic pressure, or nagging pain in the abdominal area.  You have persistent nausea, vomiting, or diarrhea.  You have a bad smelling vaginal discharge.  You have pain with urination.  You notice increased swelling in your face, hands, legs, or ankles. SEEK IMMEDIATE MEDICAL CARE IF:   You have a fever.  You are leaking fluid from your vagina.  You have spotting or bleeding from your vagina.  You have severe abdominal cramping or pain.  You have rapid weight gain or loss.  You vomit blood or material that looks like coffee grounds.  You are exposed to [place] measles and have never had them.  You are exposed to fifth disease or chickenpox.  You develop a severe headache.  You have shortness of breath.  You have any kind of trauma, such as from a fall or a car accident. Document Released: 06/12/2001 Document Revised: 11/02/2013 Document Reviewed: 04/28/2013 [facility] Patient Information 2015 [place], [place]. This information is not intended to replace advice given to you by your health care provider. Make sure you discuss any questions you have with your health care provider.  Prenatal Care  WHAT IS PRENATAL CARE?  Prenatal care means health care during your pregnancy, before your baby is born. It is very important to take care of yourself and your baby during your pregnancy by:   Getting early prenatal care. If you know you are pregnant, or think you might be pregnant, call your health care provider as soon as possible. Schedule a visit for a prenatal exam.  Getting regular prenatal care. Follow your health care provider's schedule for blood and other necessary tests. Do not miss appointments.  Doing everything you can to keep yourself and your baby healthy during your pregnancy.  Getting complete care. Prenatal care should include evaluation of the medical, dietary, educational, psychological, and social needs of you and your  significant other. The medical and genetic history of your family and the family of your baby's father should be discussed with your health care provider.  Discussing with your health care provider:  Prescription, over-the-counter, and herbal medicines that you take.  Any history of substance abuse, alcohol use, smoking, and illegal drug use.  Any history of domestic abuse and violence.  Immunizations you have received.  Your nutrition and diet.  The amount of exercise you do.  Any environmental and occupational hazards to which you are exposed.  History of sexually transmitted  infections for both you and your partner.  Previous pregnancies you have had. WHY IS PRENATAL CARE SO IMPORTANT?  By regularly seeing your health care provider, you help ensure that problems can be identified early so that they can be treated as soon as possible. Other problems might be prevented. Many studies have shown that early and regular prenatal care is important for the health of mothers and their babies.  HOW CAN I TAKE CARE OF MYSELF WHILE I AM PREGNANT?  Here are ways to take care of yourself and your baby:   Start or continue taking your multivitamin with 400 micrograms (mcg) of folic acid every day.  Get early and regular prenatal care. It is very important to see a health care provider during your pregnancy. Your health care provider will check at each visit to make sure that you and your baby are healthy. If there are any problems, action can be taken right away to help you and your baby.  Eat a healthy diet that includes:  Fruits.  Vegetables.  Foods low in saturated fat.  Whole grains.  Calcium-rich foods, such as milk, yogurt, and hard cheeses.  Drink 6-8 glasses of liquids a day.  Unless your health care provider tells you not to, try to be physically active for 30 minutes, most days of the week. If you are pressed for time, you can get your activity in through 10-minute segments,  three times a day.  Do not smoke, drink alcohol, or use drugs. These can cause long-term damage to your baby. Talk with your health care provider about steps to take to stop smoking. Talk with a member of your faith community, a counselor, a trusted friend, or your health care provider if you are concerned about your alcohol or drug use.  Ask your health care provider before taking any medicine, even over-the-counter medicines. Some medicines are not safe to take during pregnancy.  Get plenty of rest and sleep.  Avoid hot tubs and saunas during pregnancy.  Do not have X-rays taken unless absolutely necessary and with the recommendation of your health care provider. A lead shield can be placed on your abdomen to protect your baby when X-rays are taken in other parts of your body.  Do not empty the cat litter when you are pregnant. It may contain a parasite that causes an infection called toxoplasmosis, which can cause birth defects. Also, use gloves when working in garden areas used by cats.  Do not eat uncooked or undercooked meats or fish.  Do not eat soft, mold-ripened cheeses (Brie, Camembert, and chevre) or soft, blue-veined cheese (Danish blue and Roquefort).  Stay away from toxic chemicals like:  Insecticides.  Solvents (some cleaners or paint thinners).  Lead.  Mercury.  Sexual intercourse may continue until the end of the pregnancy, unless you have a medical problem or there is a problem with the pregnancy and your health care provider tells you not to.  Do not wear high-heel shoes, especially during the second half of the pregnancy. You can lose your balance and fall.  Do not take long trips, unless absolutely necessary. Be sure to see your health care provider before going on the trip.  Do not sit in one position for more than 2 hours when on a trip.  Take a copy of your medical records when going on a trip. Know where a hospital is located in the city you are visiting,  in case of an emergency.  Most dangerous household  products will have pregnancy warnings on their labels. Ask your health care provider about products if you are unsure.  Limit or eliminate your caffeine intake from coffee, tea, sodas, medicines, and chocolate.  Many women continue working through pregnancy. Staying active might help you stay healthier. If you have a question about the safety or the hours you work at your particular job, talk with your health care provider.  Get informed:  Read books.  Watch videos.  Go to childbirth classes for you and your significant other.  Talk with experienced moms.  Ask your health care provider about childbirth education classes for you and your partner. Classes can help you and your partner prepare for the birth of your baby.  Ask about a baby doctor (pediatrician) and methods and pain medicine for labor, delivery, and possible cesarean delivery. HOW OFTEN SHOULD I SEE MY HEALTH CARE PROVIDER DURING PREGNANCY?  Your health care provider will give you a schedule for your prenatal visits. You will have visits more often as you get closer to the end of your pregnancy. An average pregnancy lasts about 40 weeks.  A typical schedule includes visiting your health care provider:   About once each month during your first 6 months of pregnancy.  Every 2 weeks during the next 2 months.  Weekly in the last month, until the delivery date. Your health care provider will probably want to see you more often if:  You are older than 35 years.  Your pregnancy is high risk because you have certain health problems or problems with the pregnancy, such as:  Diabetes.  High blood pressure.  The baby is not growing on schedule, according to the dates of the pregnancy. Your health care provider will do special tests to make sure you and your baby are not having any serious problems. WHAT HAPPENS DURING PRENATAL VISITS?   At your first prenatal visit, your  health care provider will do a physical exam and talk to you about your health history and the health history of your partner and your family. Your health care provider will be able to tell you what date to expect your baby to be born on.  Your first physical exam will include checks of your blood pressure, measurements of your height and weight, and an exam of your pelvic organs. Your health care provider will do a Pap test if you have not had one recently and will do cultures of your cervix to make sure there is no infection.  At each prenatal visit, there will be tests of your blood, urine, blood pressure, weight, and the progress of the baby will be checked.  At your later prenatal visits, your health care provider will check how you are doing and how your baby is developing. You may have a number of tests done as your pregnancy progresses.  Ultrasound exams are often used to check on your baby's growth and health.  You may have more urine and blood tests, as well as special tests, if needed. These may include amniocentesis to examine fluid in the pregnancy sac, stress tests to check how the baby responds to contractions, or a biophysical profile to measure your baby's well-being. Your health care provider will explain the tests and why they are necessary.  You should be tested for high blood sugar (gestational diabetes) between the 24th and 28th weeks of your pregnancy.  You should discuss with your health care provider your plans to breastfeed or bottle-feed your baby.  Each  visit is also a chance for you to learn about staying healthy during pregnancy and to ask questions. Document Released: 06/21/2003 Document Revised: 06/23/2013 Document Reviewed: 09/02/2013 [facility] Patient Information 2015 [place]. This information is not intended to replace advice given to you by your health care provider. Make sure you discuss any questions you have with your health care provider.

## 2014-07-07 NOTE — ED Notes (Signed)
Patient states abdominal pain with nausea and vomiting. Emesis X2 in 24hours. A&OX4.

## 2014-07-07 NOTE — ED Provider Notes (Signed)
CSN: [id number]     Arrival date & time 07/07/14  2100 History  This chart was scribed for Flint Melter, MD by Bronson Curb, ED Scribe. This patient was seen in room APA08/APA08 and the patient's care was started at 10:00 PM.  Chief Complaint  Patient presents with  . Abdominal Pain    The history is provided by the patient. No language interpreter was used.     HPI Comments: Marissa Friedman is a 19 y.o. female who presents to the Emergency Department complaining of constant generalized abdominal pain that began yesterday. Patient notes associated nausea and 2 episodes of vomiting in the last 24 hours. Patient has taken ibuprofen for pain relief. She denies diarrhea, fever, or urinary symptoms. LNMP 3 weeks ago.  Past Medical History  Diagnosis Date  . Anxiety    History reviewed. No pertinent past surgical history. Family History  Problem Relation Age of Onset  . Diabetes Other   . Hypertension Other   . Mental illness Other   . Stroke Other   . Coronary artery disease Other    History  Substance Use Topics  . Smoking status: Current Every Day Smoker -- 0.50 packs/day    Types: Cigarettes  . Smokeless tobacco: Not on file     Comment: Decreased to 3 cigarettes a day.  . Alcohol Use: No     Comment: occasional. None since +preg test.   OB History    Gravida Para Term Preterm AB TAB SAB Ectopic Multiple Living   1              Review of Systems  Constitutional: Negative for fever.  Gastrointestinal: Positive for nausea, vomiting and abdominal pain. Negative for diarrhea.  Genitourinary: Negative for difficulty urinating.  All other systems reviewed and are negative.     Allergies  Penicillins and Wellbutrin  Home Medications   Prior to Admission medications   Medication Sig Start Date End Date Taking? Authorizing Provider  ibuprofen (ADVIL,MOTRIN) 200 MG tablet Take 400 mg by mouth every 6 (six) hours as needed for headache, mild pain or moderate pain.    Yes Historical Provider, MD  ciprofloxacin (CIPRO) 500 MG tablet Take 1 tablet (500 mg total) by mouth 2 (two) times daily. Patient not taking: Reported on 07/07/2014 09/14/13   Irish Elders, NP  ondansetron (ZOFRAN) 8 MG tablet Take 1 tablet (8 mg total) by mouth every 8 (eight) hours as needed for nausea or vomiting. 07/07/14   Flint Melter, MD   Triage Vitals: BP 129/79 mmHg  Pulse 107  Temp(Src) 98.2 F (36.8 C) (Oral)  Resp 24  Ht  (1.626 m)  Wt 145 lb (65.772 kg)  BMI 24.88 kg/m2  SpO2 100%  LMP 06/06/2014  Physical Exam  Constitutional: She is oriented to person, place, and time. She appears well-developed and well-nourished.  HENT:  Head: Normocephalic and atraumatic.  Eyes: Conjunctivae and EOM are normal. Pupils are equal, round, and reactive to light.  Neck: Normal range of motion and phonation normal. Neck supple.  Cardiovascular: Normal rate and regular rhythm.   Pulmonary/Chest: Effort normal and breath sounds normal. She exhibits no tenderness.  Abdominal: Soft. She exhibits no distension. There is no tenderness. There is no guarding.  Musculoskeletal: Normal range of motion.  Neurological: She is alert and oriented to person, place, and time. She exhibits normal muscle tone.  Skin: Skin is warm and dry.  Psychiatric: She has a normal mood and affect. Her  behavior is normal. Judgment and thought content normal.  Nursing note and vitals reviewed.   ED Course  Procedures (including critical care time)  DIAGNOSTIC STUDIES: Oxygen Saturation is 100% on room air, normal by my interpretation.    COORDINATION OF CARE: At 2203 Discussed treatment plan with patient which includes IV fluids and antiemetic. Patient agrees.   At 2242 Informed patient of positive pregnancy test. She denies any vaginal bleeding, but is established with an OB/GYN. Will prescribe medication for nausea  Labs Review Labs Reviewed  COMPREHENSIVE METABOLIC PANEL - Abnormal; Notable for  the following:    Potassium 3.3 (*)    Glucose, Bld 105 (*)    All other components within normal limits  PREGNANCY, URINE - Abnormal; Notable for the following:    Preg Test, Ur POSITIVE (*)    All other components within normal limits  URINALYSIS, ROUTINE W REFLEX MICROSCOPIC - Abnormal; Notable for the following:    Leukocytes, UA SMALL (*)    All other components within normal limits  URINE MICROSCOPIC-ADD ON - Abnormal; Notable for the following:    Squamous Epithelial / LPF MANY (*)    Bacteria, UA FEW (*)    All other components within normal limits  CBC WITH DIFFERENTIAL    Imaging Review No results found.   EKG Interpretation None      MDM   Final diagnoses:  Pregnancy    Uncomplicated 1st trimester pregnancy. No abdominal pain. Doubt ectopic, STD or SBI. Likely morning sickness.  Nursing Notes Reviewed/ Care Coordinated Applicable Imaging Reviewed Interpretation of Laboratory Data incorporated into ED treatment  The patient appears reasonably screened and/or stabilized for discharge and I doubt any other medical condition or other [facility] requiring further screening, evaluation, or treatment in the ED at this time prior to discharge.  Plan: Home Medications- none; Home Treatments- rest, fluids; return here if the recommended treatment, does not improve the symptoms; Recommended follow up- OB f/u asap   I personally performed the services described in this documentation, which was scribed in my presence. The recorded information has been reviewed and is accurate.      Flint MelterElliott L Caylea Foronda, MD 07/08/14 [phone]

## 2014-07-12 ENCOUNTER — Encounter (HOSPITAL_COMMUNITY): Payer: Self-pay | Admitting: *Deleted

## 2014-07-12 ENCOUNTER — Inpatient Hospital Stay (HOSPITAL_COMMUNITY)
Admission: AD | Admit: 2014-07-12 | Discharge: 2014-07-12 | Disposition: A | Discharge status: Home / Self Care | Payer: Medicaid Other | Source: Ambulatory Visit | Attending: Family Medicine | Admitting: Family Medicine

## 2014-07-12 ENCOUNTER — Inpatient Hospital Stay (HOSPITAL_COMMUNITY): Payer: Medicaid Other

## 2014-07-12 DIAGNOSIS — O99331 Smoking (tobacco) complicating pregnancy, first trimester: Secondary | ICD-10-CM | POA: Insufficient documentation

## 2014-07-12 DIAGNOSIS — R1030 Lower abdominal pain, unspecified: Secondary | ICD-10-CM | POA: Diagnosis not present

## 2014-07-12 DIAGNOSIS — F17210 Nicotine dependence, cigarettes, uncomplicated: Secondary | ICD-10-CM | POA: Insufficient documentation

## 2014-07-12 DIAGNOSIS — Z3A01 Less than 8 weeks gestation of pregnancy: Secondary | ICD-10-CM | POA: Diagnosis not present

## 2014-07-12 DIAGNOSIS — O26899 Other specified pregnancy related conditions, unspecified trimester: Secondary | ICD-10-CM

## 2014-07-12 DIAGNOSIS — R197 Diarrhea, unspecified: Secondary | ICD-10-CM | POA: Diagnosis not present

## 2014-07-12 DIAGNOSIS — O210 Mild hyperemesis gravidarum: Secondary | ICD-10-CM | POA: Diagnosis present

## 2014-07-12 DIAGNOSIS — O9989 Other specified diseases and conditions complicating pregnancy, childbirth and the puerperium: Secondary | ICD-10-CM | POA: Diagnosis not present

## 2014-07-12 DIAGNOSIS — R109 Unspecified abdominal pain: Secondary | ICD-10-CM

## 2014-07-12 LAB — TYPE AND SCREEN
ABO/RH(D): O POS
Antibody Screen: NEGATIVE

## 2014-07-12 LAB — CBC
HCT: 43 % (ref 36.0–46.0)
HEMOGLOBIN: 14.9 g/dL (ref 12.0–15.0)
MCH: 30.8 pg (ref 26.0–34.0)
MCHC: 34.7 g/dL (ref 30.0–36.0)
MCV: 88.8 fL (ref 78.0–100.0)
Platelets: 161 10*3/uL (ref 150–400)
RBC: 4.84 MIL/uL (ref 3.87–5.11)
RDW: 13.8 % (ref 11.5–15.5)
WBC: 6.9 10*3/uL (ref 4.0–10.5)

## 2014-07-12 LAB — URINALYSIS, ROUTINE W REFLEX MICROSCOPIC
Glucose, UA: NEGATIVE mg/dL
Hgb urine dipstick: NEGATIVE
Ketones, ur: 15 mg/dL — AB
Leukocytes, UA: NEGATIVE
NITRITE: NEGATIVE
PROTEIN: NEGATIVE mg/dL
UROBILINOGEN UA: 0.2 mg/dL (ref 0.0–1.0)
pH: 5.5 (ref 5.0–8.0)

## 2014-07-12 LAB — WET PREP, GENITAL
TRICH WET PREP: NONE SEEN
Yeast Wet Prep HPF POC: NONE SEEN

## 2014-07-12 LAB — COMPREHENSIVE METABOLIC PANEL
ALT: 18 U/L (ref 0–35)
AST: 21 U/L (ref 0–37)
Albumin: 4.4 g/dL (ref 3.5–5.2)
Alkaline Phosphatase: 78 U/L (ref 39–117)
Anion gap: 8 (ref 5–15)
BUN: 11 mg/dL (ref 6–23)
CHLORIDE: 107 meq/L (ref 96–112)
CO2: 24 mmol/L (ref 19–32)
Calcium: 9.1 mg/dL (ref 8.4–10.5)
Creatinine, Ser: 0.67 mg/dL (ref 0.50–1.10)
GFR calc Af Amer: >90 mL/min (ref >90)
GFR calc non Af Amer: >90 mL/min (ref >90)
GLUCOSE: 117 mg/dL — AB (ref 70–99)
POTASSIUM: 4 mmol/L (ref 3.5–5.1)
Sodium: 139 mmol/L (ref 135–145)
Total Bilirubin: 0.5 mg/dL (ref 0.3–1.2)
Total Protein: 7.6 g/dL (ref 6.0–8.3)

## 2014-07-12 LAB — ABO/RH: ABO/RH(D): O POS

## 2014-07-12 LAB — HCG, QUANTITATIVE, PREGNANCY: hCG, Beta Chain, Quant, S: 2280 m[IU]/mL — ABNORMAL HIGH (ref <5)

## 2014-07-12 MED ORDER — PROMETHAZINE HCL 25 MG PO TABS: 25.0000 mg | ORAL_TABLET | Freq: Four times a day (QID) | ORAL | Status: DC | PRN

## 2014-07-12 MED ORDER — PROMETHAZINE HCL 25 MG PO TABS
25.0000 mg | ORAL_TABLET | Freq: Once | ORAL | Status: AC
  Administered 2014-07-12: 25 mg via ORAL
  Filled 2014-07-12: qty 1

## 2014-07-12 NOTE — MAU Provider Note (Signed)
History     CSN: [id number]  Arrival date and time: 07/12/14 1109   First Provider Initiated Contact with Patient 07/12/14 1405      Chief Complaint  Patient presents with  . Nausea  . Emesis  . Diarrhea   HPI This is a 19 y.o. female at 77w3dwho presents with c/o nausea, vomiting, diarrhea and lower abdominal pain. Husband had stomach flu a few days ago.   Denies bleeding   Was seen on 1/6 in Ed and discharged. Has not received prenatal care yet.   RN Note: Pt presents to MAU with complaints of nausea, vomiting, diarrhea and lower abdominal pain    Seen 07/07/14 in ED:                                        Final diagnoses:  Pregnancy    Uncomplicated 1st trimester pregnancy. No abdominal pain. Doubt ectopic, STD or SBI. Likely morning sickness.  Nursing Notes Reviewed/ Care Coordinated Applicable Imaging Reviewed Interpretation of Laboratory Data incorporated into ED treatment  The patient appears reasonably screened and/or stabilized for discharge and I doubt any other medical condition or other [facility]iring further screening, evaluation, or treatment in the ED at this time prior to discharge.  Plan: Home Medications- none; Home Treatments- rest, fluids; return here if the recommended treatment, does not improve the symptoms; Recommended follow up- OB f/u asap   I personally performed the services described in this documentation, which was scribed in my presence. The recorded information has been reviewed and is accurate.     ERicharda Blade MD 07/08/14 1433           OB History    Gravida Para Term Preterm AB TAB SAB Ectopic Multiple Living   2               Past Medical History  Diagnosis Date  . Anxiety     History reviewed. No pertinent past surgical history.  Family History  Problem Relation Age of Onset  . Diabetes Other   . Hypertension Other   . Mental illness Other   . Stroke Other   . Coronary artery disease Other      History  Substance Use Topics  . Smoking status: Current Every Day Smoker -- 0.50 packs/day    Types: Cigarettes  . Smokeless tobacco: Not on file     Comment: Decreased to 3 cigarettes a day.  . Alcohol Use: No     Comment: occasional. None since +preg test.    Allergies:  Allergies  Allergen Reactions  . Penicillins     Childhood reaction  . Wellbutrin [Bupropion]     Pt unsure of reaction.    Prescriptions prior to admission  Medication Sig Dispense Refill Last Dose  . ciprofloxacin (CIPRO) 500 MG tablet Take 1 tablet (500 mg total) by mouth 2 (two) times daily. (Patient not taking: Reported on 07/07/2014) 14 tablet 0 Not Taking at Unknown time  . ibuprofen (ADVIL,MOTRIN) 200 MG tablet Take 400 mg by mouth every 6 (six) hours as needed for headache, mild pain or moderate pain.   Not Taking at Unknown time  . ondansetron (ZOFRAN) 8 MG tablet Take 1 tablet (8 mg total) by mouth every 8 (eight) hours as needed for nausea or vomiting. (Patient not taking: Reported on 07/12/2014) 20 tablet 0 Not Taking at Unknown time  Review of Systems  Constitutional: Positive for malaise/fatigue. Negative for fever and chills.  Gastrointestinal: Positive for nausea, vomiting, abdominal pain and diarrhea. Negative for constipation.  Genitourinary: Negative for dysuria.  Neurological: Negative for dizziness.   Physical Exam   Blood pressure 112/67, pulse 101, temperature 98.7 F (37.1 C), resp. rate 18, last menstrual period 06/06/2014, unknown if currently breastfeeding.  Physical Exam  Constitutional: She is oriented to person, place, and time. She appears well-developed. No distress.  HENT:  Head: Normocephalic.  Cardiovascular: Normal rate and regular rhythm.  Exam reveals no gallop and no friction rub.   No murmur heard. Respiratory: Effort normal and breath sounds normal. No respiratory distress.  GI: Soft. She exhibits no distension and no mass. There is tenderness (generalized  mild tenderness). There is no rebound and no guarding.  Genitourinary:  Uterus small and nontender Adnexae nontender Cultures obtained Cervix closed  Musculoskeletal: Normal range of motion.  Neurological: She is alert and oriented to person, place, and time.  Skin: Skin is warm and dry.  Psychiatric: She has a normal mood and affect.    MAU Course  Procedures  MDM Results for orders placed or performed during the hospital encounter of 07/12/14 (from the past 72 hour(s))  Urinalysis, Routine w reflex microscopic     Status: Abnormal   Collection Time: 07/12/14 11:16 AM  Result Value Ref Range   Color, Urine YELLOW YELLOW   APPearance HAZY (A) CLEAR   Specific Gravity, Urine >1.030 (H) 1.005 - 1.030   pH 5.5 5.0 - 8.0   Glucose, UA NEGATIVE NEGATIVE mg/dL   Hgb urine dipstick NEGATIVE NEGATIVE   Bilirubin Urine SMALL (A) NEGATIVE   Ketones, ur 15 (A) NEGATIVE mg/dL   Protein, ur NEGATIVE NEGATIVE mg/dL   Urobilinogen, UA 0.2 0.0 - 1.0 mg/dL   Nitrite NEGATIVE NEGATIVE   Leukocytes, UA NEGATIVE NEGATIVE    Comment: MICROSCOPIC NOT DONE ON URINES WITH NEGATIVE PROTEIN, BLOOD, LEUKOCYTES, NITRITE, OR GLUCOSE <1000 mg/dL.  hCG, quantitative, pregnancy     Status: Abnormal   Collection Time: 07/12/14 11:34 AM  Result Value Ref Range   hCG, Beta Chain, Quant, S 2280 (H) <5 mIU/mL    Comment:          GEST. AGE      CONC.  (mIU/mL)   <=1 WEEK        5 - 50     2 WEEKS       50 - 500     3 WEEKS       100 - 10,000     4 WEEKS     1,000 - 30,000     5 WEEKS     3,500 - 115,000   6-8 WEEKS     12,000 - 270,000    12 WEEKS     15,000 - 220,000        FEMALE AND NON-PREGNANT FEMALE:     LESS THAN 5 mIU/mL   CBC     Status: None   Collection Time: 07/12/14 11:34 AM  Result Value Ref Range   WBC 6.9 4.0 - 10.5 K/uL   RBC 4.84 3.87 - 5.11 MIL/uL   Hemoglobin 14.9 12.0 - 15.0 g/dL   HCT 43.0 36.0 - 46.0 %   MCV 88.8 78.0 - 100.0 fL   MCH 30.8 26.0 - 34.0 pg   MCHC 34.7 30.0 -  36.0 g/dL   RDW 13.8 11.5 - 15.5 %   Platelets 161  150 - 400 K/uL  Type and screen     Status: None   Collection Time: 07/12/14 11:34 AM  Result Value Ref Range   ABO/RH(D) O POS    Antibody Screen NEG    Sample Expiration 07/15/2014   Comprehensive metabolic panel     Status: Abnormal   Collection Time: 07/12/14 11:34 AM  Result Value Ref Range   Sodium 139 135 - 145 mmol/L    Comment: Please note change in reference range.   Potassium 4.0 3.5 - 5.1 mmol/L    Comment: Please note change in reference range.   Chloride 107 96 - 112 mEq/L   CO2 24 19 - 32 mmol/L   Glucose, Bld 117 (H) 70 - 99 mg/dL   BUN 11 6 - 23 mg/dL   Creatinine, Ser 0.67 0.50 - 1.10 mg/dL   Calcium 9.1 8.4 - 10.5 mg/dL   Total Protein 7.6 6.0 - 8.3 g/dL   Albumin 4.4 3.5 - 5.2 g/dL   AST 21 0 - 37 U/L   ALT 18 0 - 35 U/L   Alkaline Phosphatase 78 39 - 117 U/L   Total Bilirubin 0.5 0.3 - 1.2 mg/dL   GFR calc non Af Amer >90 >90 mL/min   GFR calc Af Amer >90 >90 mL/min    Comment: (NOTE) The eGFR has been calculated using the CKD EPI equation. This calculation has not been validated in all clinical situations. eGFR's persistently <90 mL/min signify possible Chronic Kidney Disease.    Anion gap 8 5 - 15  ABO/Rh     Status: None   Collection Time: 07/12/14 11:40 AM  Result Value Ref Range   ABO/RH(D) O POS   GC/Chlamydia Probe Amp (multiple spec sources)     Status: None   Collection Time: 07/12/14  2:28 PM  Result Value Ref Range   CT Probe RNA NEGATIVE NEGATIVE   GC Probe RNA NEGATIVE NEGATIVE    Comment: (NOTE)                                                                                       **Normal Reference Range: Negative**      Assay performed using the Gen-Probe APTIMA COMBO2 (R) Assay. Acceptable specimen types for this assay include APTIMA Swabs (Unisex, endocervical, urethral, or vaginal), first void urine, and ThinPrep liquid based cytology samples. Performed at [facility] prep, genital     Status: Abnormal   Collection Time: 07/12/14  2:28 PM  Result Value Ref Range   Yeast Wet Prep HPF POC NONE SEEN NONE SEEN   Trich, Wet Prep NONE SEEN NONE SEEN   Clue Cells Wet Prep HPF POC FEW (A) NONE SEEN   WBC, Wet Prep HPF POC FEW (A) NONE SEEN    Comment: FEW BACTERIA SEEN   US Ob Comp Less 14 Wks  07/12/2014   CLINICAL DATA:  Pain. Quantitative beta HCG is 2,208 knee. By LMP the patient is 5 weeks 1 day. EDC is 03/12/2014 by LMP.  EXAM: OBSTETRIC <14 WK [place] AND TRANSVAGINAL OB US  TECHNIQUE: Both transabdominal and transvaginal ultrasound examinations were performed  for complete evaluation of the gestation as well as the maternal uterus, adnexal regions, and pelvic cul-de-sac. Transvaginal technique was performed to assess early pregnancy.  COMPARISON:  None.  FINDINGS: Intrauterine gestational sac: Likely present  Yolk sac:  Not seen  Embryo:  Not seen  Cardiac Activity: Not seen  MSD:  3.5  mm   4 w   6  d  Maternal uterus/adnexae: Moderate subchorionic hemorrhage or fluid identified. A right corpus luteum cyst is present. Left ovary has a normal appearance.  IMPRESSION: Probable early intrauterine gestational sac, but no yolk sac, fetal pole, or cardiac activity yet visualized. Recommend follow-up quantitative B-HCG levels and follow-up US in 14 days to confirm and assess viability. This recommendation follows SRU consensus guidelines: Diagnostic Criteria for Nonviable Pregnancy Early in the First Trimester. [place] Med 2013; 606:0045-99.   Electronically Signed   By: Shon Hale M.D.   On: 07/12/2014 13:25   US Ob Transvaginal  07/12/2014   CLINICAL DATA:  Pain. Quantitative beta HCG is 2,208 knee. By LMP the patient is 5 weeks 1 day. EDC is 03/12/2014 by LMP.  EXAM: OBSTETRIC <14 WK [place] AND TRANSVAGINAL OB US  TECHNIQUE: Both transabdominal and transvaginal ultrasound examinations were performed for complete evaluation of the gestation as well as the  maternal uterus, adnexal regions, and pelvic cul-de-sac. Transvaginal technique was performed to assess early pregnancy.  COMPARISON:  None.  FINDINGS: Intrauterine gestational sac: Likely present  Yolk sac:  Not seen  Embryo:  Not seen  Cardiac Activity: Not seen  MSD:  3.5  mm   4 w   6  d  Maternal uterus/adnexae: Moderate subchorionic hemorrhage or fluid identified. A right corpus luteum cyst is present. Left ovary has a normal appearance.  IMPRESSION: Probable early intrauterine gestational sac, but no yolk sac, fetal pole, or cardiac activity yet visualized. Recommend follow-up quantitative B-HCG levels and follow-up US in 14 days to confirm and assess viability. This recommendation follows SRU consensus guidelines: Diagnostic Criteria for Nonviable Pregnancy Early in the First Trimester. [place] [employer] Med 2013; 774:1423-95.   Electronically Signed   By: Shon Hale M.D.   On: 07/12/2014 13:25      Assessment and Plan  A:  Pregnancy at [redacted]w[redacted]d       Probable gastroenteritis      Early pregnancy abdominal pain  P:  Discussed followup with repeat Quant HCG in 2 days      [facility]      FT called and made appointment for patient to come there for HCG level  [facility], 4:03 PM

## 2014-07-12 NOTE — Discharge Instructions (Signed)

## 2014-07-12 NOTE — MAU Note (Signed)
Not in lobby at 1229

## 2014-07-12 NOTE — MAU Note (Signed)
Pt presents to MAU with complaints of nausea, vomiting, diarrhea and lower abdominal pain

## 2014-07-13 LAB — GC/CHLAMYDIA PROBE AMP
CT Probe RNA: NEGATIVE
GC Probe RNA: NEGATIVE

## 2014-07-14 ENCOUNTER — Other Ambulatory Visit: Payer: Self-pay

## 2014-07-14 ENCOUNTER — Other Ambulatory Visit: Payer: Medicaid Other

## 2014-07-14 DIAGNOSIS — O200 Threatened abortion: Secondary | ICD-10-CM

## 2014-07-15 ENCOUNTER — Telehealth: Payer: Self-pay | Admitting: Adult Health

## 2014-07-15 LAB — HCG, QUANTITATIVE, PREGNANCY: hCG, Beta Chain, Quant, S: 3678.6 m[IU]/mL

## 2014-07-15 NOTE — Telephone Encounter (Signed)
Left message needs QHCG in am, as numbers are rising

## 2014-07-16 ENCOUNTER — Telehealth: Payer: Self-pay | Admitting: Adult Health

## 2014-07-16 NOTE — Telephone Encounter (Signed)
Pt aware of labs[facility] or pain, will schedule US after labs back

## 2014-07-19 ENCOUNTER — Telehealth: Payer: Self-pay | Admitting: Adult Health

## 2014-07-19 ENCOUNTER — Other Ambulatory Visit: Payer: Medicaid Other

## 2014-07-19 DIAGNOSIS — O200 Threatened abortion: Secondary | ICD-10-CM

## 2014-07-19 DIAGNOSIS — Z3490 Encounter for supervision of normal pregnancy, unspecified, unspecified trimester: Secondary | ICD-10-CM

## 2014-07-19 LAB — HCG, QUANTITATIVE, PREGNANCY: HCG, BETA CHAIN, QUANT, S: 16350 m[IU]/mL

## 2014-07-19 NOTE — Telephone Encounter (Signed)
Pt aware QHCG 16350 will come in, in am for dating US at 10:30

## 2014-07-19 NOTE — Addendum Note (Signed)
Addended by: Richardson ChiquitoRAVIS, ASHLEY M on: 07/19/2014 09:47 AM   Modules accepted: Orders

## 2014-07-20 ENCOUNTER — Other Ambulatory Visit: Payer: Self-pay | Admitting: Adult Health

## 2014-07-20 ENCOUNTER — Ambulatory Visit (INDEPENDENT_AMBULATORY_CARE_PROVIDER_SITE_OTHER): Payer: Medicaid Other

## 2014-07-20 DIAGNOSIS — O3680X1 Pregnancy with inconclusive fetal viability, fetus 1: Secondary | ICD-10-CM

## 2014-07-20 DIAGNOSIS — O26841 Uterine size-date discrepancy, first trimester: Secondary | ICD-10-CM

## 2014-07-20 DIAGNOSIS — Z3490 Encounter for supervision of normal pregnancy, unspecified, unspecified trimester: Secondary | ICD-10-CM

## 2014-07-20 NOTE — Progress Notes (Signed)
U/S-single IUP with +FCA noted, FHR-100 bpm, CRL c/w 5+4wks EDD 03/18/2015, cx appears closed, bilateral adnexa appears WNL

## 2014-07-23 ENCOUNTER — Other Ambulatory Visit: Payer: Self-pay

## 2014-07-27 ENCOUNTER — Other Ambulatory Visit (HOSPITAL_COMMUNITY)
Admission: RE | Admit: 2014-07-27 | Discharge: 2014-07-27 | Disposition: A | Discharge status: Home / Self Care | Payer: Medicaid Other | Source: Ambulatory Visit | Attending: Advanced Practice Midwife | Admitting: Advanced Practice Midwife

## 2014-07-27 ENCOUNTER — Encounter: Payer: Self-pay | Admitting: Advanced Practice Midwife

## 2014-07-27 ENCOUNTER — Ambulatory Visit (INDEPENDENT_AMBULATORY_CARE_PROVIDER_SITE_OTHER): Payer: Medicaid Other | Admitting: Advanced Practice Midwife

## 2014-07-27 VITALS — BP 112/70 | Wt 172.0 lb

## 2014-07-27 DIAGNOSIS — Z3481 Encounter for supervision of other normal pregnancy, first trimester: Secondary | ICD-10-CM

## 2014-07-27 DIAGNOSIS — Z113 Encounter for screening for infections with a predominantly sexual mode of transmission: Secondary | ICD-10-CM | POA: Insufficient documentation

## 2014-07-27 DIAGNOSIS — Z114 Encounter for screening for human immunodeficiency virus [HIV]: Secondary | ICD-10-CM

## 2014-07-27 DIAGNOSIS — Z3491 Encounter for supervision of normal pregnancy, unspecified, first trimester: Secondary | ICD-10-CM

## 2014-07-27 DIAGNOSIS — Z3490 Encounter for supervision of normal pregnancy, unspecified, unspecified trimester: Secondary | ICD-10-CM | POA: Insufficient documentation

## 2014-07-27 DIAGNOSIS — Z0283 Encounter for blood-alcohol and blood-drug test: Secondary | ICD-10-CM

## 2014-07-27 DIAGNOSIS — Z1389 Encounter for screening for other disorder: Secondary | ICD-10-CM | POA: Diagnosis not present

## 2014-07-27 DIAGNOSIS — Z331 Pregnant state, incidental: Secondary | ICD-10-CM

## 2014-07-27 DIAGNOSIS — Z01419 Encounter for gynecological examination (general) (routine) without abnormal findings: Secondary | ICD-10-CM | POA: Insufficient documentation

## 2014-07-27 DIAGNOSIS — Z3682 Encounter for antenatal screening for nuchal translucency: Secondary | ICD-10-CM

## 2014-07-27 DIAGNOSIS — Z0184 Encounter for antibody response examination: Secondary | ICD-10-CM

## 2014-07-27 MED ORDER — DOXYLAMINE-PYRIDOXINE 10-10 MG PO TBEC: DELAYED_RELEASE_TABLET | ORAL | Status: DC

## 2014-07-27 NOTE — Progress Notes (Signed)
  Subjective:    Marissa PatersonBrittany N Friedman is a G2P1001 1475w4d being seen today for her first obstetrical visit.  Her obstetrical history is significant for nothing. .  Pregnancy history fully reviewed.  Patient reports nausea and vomiting.  Filed Vitals:   07/27/14 0912  BP: 112/70  Weight: 172 lb (78.019 kg)    HISTORY: OB History  Gravida Para Term Preterm AB SAB TAB Ectopic Multiple Living  2 1 1       1     # Outcome Date GA Lbr Len/2nd Weight Sex Delivery Anes PTL Lv  2 Current           1 Term 02/06/13 5048w2d  7 lb 15 oz (3.6 kg) F Vag-Spont  N Y     Comments: System Generated. Please review and update pregnancy details.     Past Medical History  Diagnosis Date  . Anxiety    Past Surgical History  Procedure Laterality Date  . No past surgeries     Family History  Problem Relation Age of Onset  . Diabetes Other   . Hypertension Other   . Mental illness Other   . Stroke Other   . Coronary artery disease Other      Exam       Pelvic Exam:    Perineum: Normal Perineum   Vulva: normal   Vagina:  normal mucosa, normal discharge, no palpable nodules   Uterus Normal, Gravid, FH: ~ 6     Cervix: Normal.  Pap collected.     Adnexa: Not palpable   Urinary:  urethral meatus normal    System: Breast:  normal appearance, no masses or tenderness   Skin: normal coloration and turgor, no rashes    Neurologic: oriented, normal, normal mood   Extremities: normal strength, tone, and muscle mass   HEENT PERRLA   Mouth/Teeth mucous membranes moist, normal dentition   Neck supple and no masses   Cardiovascular: regular rate and rhythm   Respiratory:  appears well, vitals normal, no respiratory distress, acyanotic   Abdomen: soft, non-tender;  FHR: 150 us          Assessment:    Pregnancy: G2P1001 Patient Active Problem List   Diagnosis Date Noted  . Supervision of normal pregnancy 07/27/2014        Plan:     Initial labs drawn. Continue prenatal vitamins   Problem list reviewed and updated  Diclegis samples and pre auth sent Reviewed n/v relief measures and warning s/s to report  Reviewed recommended weight gain based on pre-gravid BMI.  Has already gained 17 lbs (really!)--weight warning given Encouraged well-balanced diet Genetic Screening discussed Integrated Screen: declined.  Ultrasound discussed; fetal survey: requested.  Follow up in 4 weeks for LROB.  Marissa Friedman,Marissa Friedman 07/27/2014

## 2014-07-27 NOTE — Progress Notes (Signed)
Pt given CCNC form and lab consent to read over and sign. Pt denies any problems or concerns at this time.

## 2014-07-28 LAB — VARICELLA ZOSTER ANTIBODY, IGG: Varicella IgG: 580.6 Index — ABNORMAL HIGH (ref <135.00)

## 2014-07-28 LAB — DRUG SCREEN, URINE, NO CONFIRMATION
AMPHETAMINE SCRN UR: NEGATIVE
BARBITURATE QUANT UR: NEGATIVE
Benzodiazepines.: NEGATIVE
Cocaine Metabolites: NEGATIVE
Creatinine,U: 388.6 mg/dL
MARIJUANA METABOLITE: NEGATIVE
Methadone: NEGATIVE
OPIATE SCREEN, URINE: NEGATIVE
PHENCYCLIDINE (PCP): NEGATIVE
PROPOXYPHENE: NEGATIVE

## 2014-07-28 LAB — RPR

## 2014-07-28 LAB — OXYCODONE SCREEN, UA, RFLX CONFIRM: Oxycodone Screen, Ur: NEGATIVE ng/mL

## 2014-07-28 LAB — HIV ANTIBODY (ROUTINE TESTING W REFLEX): HIV 1&2 Ab, 4th Generation: NONREACTIVE

## 2014-07-28 LAB — RUBELLA SCREEN: Rubella: 0.69 Index (ref <0.90)

## 2014-07-28 LAB — HEPATITIS B SURFACE ANTIGEN: Hepatitis B Surface Ag: NEGATIVE

## 2014-07-28 LAB — CYTOLOGY - PAP

## 2014-07-29 LAB — URINE CULTURE: Colony Count: >=100000

## 2014-08-06 ENCOUNTER — Inpatient Hospital Stay (HOSPITAL_COMMUNITY)
Admission: AD | Admit: 2014-08-06 | Discharge: 2014-08-06 | Disposition: A | Discharge status: Home / Self Care | Payer: Medicaid Other | Source: Ambulatory Visit | Attending: Obstetrics & Gynecology | Admitting: Obstetrics & Gynecology

## 2014-08-06 ENCOUNTER — Encounter (HOSPITAL_COMMUNITY): Payer: Self-pay | Admitting: *Deleted

## 2014-08-06 DIAGNOSIS — O210 Mild hyperemesis gravidarum: Secondary | ICD-10-CM | POA: Diagnosis present

## 2014-08-06 DIAGNOSIS — O219 Vomiting of pregnancy, unspecified: Secondary | ICD-10-CM

## 2014-08-06 DIAGNOSIS — F17210 Nicotine dependence, cigarettes, uncomplicated: Secondary | ICD-10-CM | POA: Insufficient documentation

## 2014-08-06 DIAGNOSIS — Z3A08 8 weeks gestation of pregnancy: Secondary | ICD-10-CM | POA: Insufficient documentation

## 2014-08-06 DIAGNOSIS — F17200 Nicotine dependence, unspecified, uncomplicated: Secondary | ICD-10-CM

## 2014-08-06 DIAGNOSIS — O99331 Smoking (tobacco) complicating pregnancy, first trimester: Secondary | ICD-10-CM | POA: Insufficient documentation

## 2014-08-06 LAB — URINALYSIS, ROUTINE W REFLEX MICROSCOPIC
Bilirubin Urine: NEGATIVE
Glucose, UA: NEGATIVE mg/dL
Hgb urine dipstick: NEGATIVE
Ketones, ur: NEGATIVE mg/dL
Nitrite: NEGATIVE
PH: 6 (ref 5.0–8.0)
Protein, ur: NEGATIVE mg/dL
UROBILINOGEN UA: 0.2 mg/dL (ref 0.0–1.0)

## 2014-08-06 LAB — URINE MICROSCOPIC-ADD ON

## 2014-08-06 MED ORDER — PROMETHAZINE HCL 25 MG PO TABS: 25.0000 mg | ORAL_TABLET | Freq: Four times a day (QID) | ORAL | Status: DC | PRN

## 2014-08-06 MED ORDER — PROMETHAZINE HCL 25 MG PO TABS
25.0000 mg | ORAL_TABLET | Freq: Once | ORAL | Status: AC
  Administered 2014-08-06: 25 mg via ORAL
  Filled 2014-08-06: qty 1

## 2014-08-06 NOTE — MAU Note (Signed)
Was seen a month ago with stomach virus. Got better and then for 2wks i have had n/v/d. Diarrhea once daily and vomiting 3-4times daily. [facility] gave me med but can't remember the name. When i take them i throw them up so they are not helping. No bleeding or d/c. Some chest pain and my R side hurts all the time. Some back pain sometimes and occ abd pain

## 2014-08-06 NOTE — Discharge Instructions (Signed)
Morning Sickness Morning sickness is when you feel sick to your stomach (nauseous) during pregnancy. This nauseous feeling may or may not come with vomiting. It often occurs in the morning but can be a problem any time of day. Morning sickness is most common during the first trimester, but it may continue throughout pregnancy. While morning sickness is unpleasant, it is usually harmless unless you develop severe and continual vomiting (hyperemesis gravidarum). This condition requires more intense treatment.  CAUSES  The cause of morning sickness is not completely known but seems to be related to normal hormonal changes that occur in pregnancy. RISK FACTORS You are at greater risk if you:  Experienced nausea or vomiting before your pregnancy.  Had morning sickness during a previous pregnancy.  Are pregnant with more than one baby, such as twins. TREATMENT  Do not use any medicines (prescription, over-the-counter, or herbal) for morning sickness without first talking to your health care provider. Your health care provider may prescribe or recommend:  Vitamin B6 supplements.  Anti-nausea medicines.  The herbal medicine ginger. HOME CARE INSTRUCTIONS   Only take over-the-counter or prescription medicines as directed by your health care provider.  Taking multivitamins before getting pregnant can prevent or decrease the severity of morning sickness in most women.  Eat a piece of dry toast or unsalted crackers before getting out of bed in the morning.  Eat five or six small meals a day.  Eat dry and bland foods (rice, baked potato). Foods high in carbohydrates are often helpful.  Do not drink liquids with your meals. Drink liquids between meals.  Avoid greasy, fatty, and spicy foods.  Get someone to cook for you if the smell of any food causes nausea and vomiting.  If you feel nauseous after taking prenatal vitamins, take the vitamins at night or with a snack.  Snack on protein  foods (nuts, yogurt, cheese) between meals if you are hungry.  Eat unsweetened gelatins for desserts.  Wearing an acupressure wristband (worn for sea sickness) may be helpful.  Acupuncture may be helpful.  Do not smoke.  Get a humidifier to keep the air in your house free of odors.  Get plenty of fresh air. SEEK MEDICAL CARE IF:   Your home remedies are not working, and you need medicine.  You feel dizzy or lightheaded.  You are losing weight. SEEK IMMEDIATE MEDICAL CARE IF:   You have persistent and uncontrolled nausea and vomiting.  You pass out (faint). MAKE SURE YOU:  Understand these instructions.  Will watch your condition.  Will get help right away if you are not doing well or get worse. Document Released: 08/09/2006 Document Revised: 06/23/2013 Document Reviewed: 12/03/2012 [facility] Patient Information 2015 [place], [place]. This information is not intended to replace advice given to you by your health care provider. Make sure you discuss any questions you have with your health care provider. Food Choices to Help Relieve Diarrhea When you have diarrhea, the foods you eat and your eating habits are very important. Choosing the right foods and drinks can help relieve diarrhea. Also, because diarrhea can last up to 7 days, you need to replace lost fluids and electrolytes (such as sodium, potassium, and chloride) in order to help prevent dehydration.  WHAT GENERAL GUIDELINES DO I NEED TO FOLLOW?  Slowly drink 1 cup (8 oz) of fluid for each episode of diarrhea. If you are getting enough fluid, your urine will be clear or pale yellow.  Eat starchy foods. Some good choices include  white rice, white toast, pasta, low-fiber cereal, baked potatoes (without the skin), saltine crackers, and bagels.  Avoid large servings of any cooked vegetables.  Limit fruit to two servings per day. A serving is  cup or 1 small piece.  Choose foods with less than 2 g of fiber per  serving.  Limit fats to less than 8 tsp (38 g) per day.  Avoid fried foods.  Eat foods that have probiotics in them. Probiotics can be found in certain dairy products.  Avoid foods and beverages that may increase the speed at which food moves through the stomach and intestines (gastrointestinal tract). Things to avoid include:  High-fiber foods, such as dried fruit, raw fruits and vegetables, nuts, seeds, and whole grain foods.  Spicy foods and high-fat foods.  Foods and beverages sweetened with high-fructose corn syrup, honey, or sugar alcohols such as xylitol, sorbitol, and mannitol. WHAT FOODS ARE RECOMMENDED? Grains White rice. White, [place], or pita breads (fresh or toasted), including plain rolls, buns, or bagels. White pasta. Saltine, soda, or graham crackers. Pretzels. Low-fiber cereal. Cooked cereals made with water (such as cornmeal, farina, or cream cereals). Plain muffins. Matzo. Melba toast. Zwieback.  Vegetables Potatoes (without the skin). Strained tomato and vegetable juices. Most well-cooked and canned vegetables without seeds. Tender lettuce. Fruits Cooked or canned applesauce, apricots, cherries, fruit cocktail, grapefruit, peaches, pears, or plums. Fresh bananas, apples without skin, cherries, grapes, cantaloupe, grapefruit, peaches, oranges, or plums.  Meat and Other Protein Products Baked or boiled chicken. Eggs. Tofu. Fish. Seafood. Smooth peanut butter. Ground or well-cooked tender beef, ham, veal, lamb, pork, or poultry.  Dairy Plain yogurt, kefir, and unsweetened liquid yogurt. Lactose-free milk, buttermilk, or soy milk. Plain hard cheese. Beverages Sport drinks. Clear broths. Diluted fruit juices (except prune). Regular, caffeine-free sodas such as ginger ale. Water. Decaffeinated teas. Oral rehydration solutions. Sugar-free beverages not sweetened with sugar alcohols. Other Bouillon, broth, or soups made from recommended foods.  The items listed above may not  be a complete list of recommended foods or beverages. Contact your dietitian for more options. WHAT FOODS ARE NOT RECOMMENDED? Grains Whole grain, whole wheat, bran, or rye breads, rolls, pastas, crackers, and cereals. Wild or brown rice. Cereals that contain more than 2 g of fiber per serving. Corn tortillas or taco shells. Cooked or dry oatmeal. Granola. Popcorn. Vegetables Raw vegetables. Cabbage, broccoli, Brussels sprouts, artichokes, baked beans, beet greens, corn, kale, legumes, peas, sweet potatoes, and yams. Potato skins. Cooked spinach and cabbage. Fruits Dried fruit, including raisins and dates. Raw fruits. Stewed or dried prunes. Fresh apples with skin, apricots, mangoes, pears, raspberries, and strawberries.  Meat and Other Protein Products Chunky peanut butter. Nuts and seeds. Beans and lentils. Tomasa BlaseBacon.  Dairy High-fat cheeses. Milk, chocolate milk, and beverages made with milk, such as milk shakes. Cream. Ice cream. Sweets and Desserts Sweet rolls, doughnuts, and sweet breads. Pancakes and waffles. Fats and Oils Butter. Cream sauces. Margarine. Salad oils. Plain salad dressings. Olives. Avocados.  Beverages Caffeinated beverages (such as coffee, tea, soda, or energy drinks). Alcoholic beverages. Fruit juices with pulp. Prune juice. Soft drinks sweetened with high-fructose corn syrup or sugar alcohols. Other Coconut. Hot sauce. Chili powder. Mayonnaise. Gravy. Cream-based or milk-based soups.  The items listed above may not be a complete list of foods and beverages to avoid. Contact your dietitian for more information. WHAT SHOULD I DO IF I BECOME DEHYDRATED? Diarrhea can sometimes lead to dehydration. Signs of dehydration include dark urine and dry mouth  and skin. If you think you are dehydrated, you should rehydrate with an oral rehydration solution. These solutions can be purchased at pharmacies, retail stores, or online.  Drink -1 cup (120-240 mL) of oral rehydration solution  each time you have an episode of diarrhea. If drinking this amount makes your diarrhea worse, try drinking smaller amounts more often. For example, drink 1-3 tsp (5-15 mL) every 5-10 minutes.  A general rule for staying hydrated is to drink 1-2 L of fluid per day. Talk to your health care provider about the specific amount you should be drinking each day. Drink enough fluids to keep your urine clear or pale yellow. Document Released: 09/08/2003 Document Revised: 06/23/2013 Document Reviewed: 05/11/2013 [facility]Exit[facility] Patient Information 2015 [place]Exit[place], [place]LL[place]. This information is not intended to replace advice given to you by your health care provider. Make sure you discuss any questions you have with your health care provider.

## 2014-08-06 NOTE — MAU Provider Note (Signed)
History     CSN: [id number]  Arrival date and time: 08/06/14 [id number]   First Provider Initiated Contact with Patient 08/06/14 2059      Chief Complaint  Patient presents with  . Emesis During Pregnancy  . Diarrhea  . Abdominal Pain   HPI Comments: Marissa Friedman 19 y.o. G2P1001 1559w0d presents to MAU with nausea and vomiting and abdominal discomfort ongoing since pregnancy. She was given Diclegis in office and  is taking 3 per day with no benefit. She has had an early ultrasound and has IUG. Her next appointment is 08/24/14.  Diarrhea  Associated symptoms include abdominal pain and vomiting.  Abdominal Pain Associated symptoms include diarrhea, nausea and vomiting.      Past Medical History  Diagnosis Date  . Anxiety     Past Surgical History  Procedure Laterality Date  . No past surgeries      Family History  Problem Relation Age of Onset  . Diabetes Other   . Hypertension Other   . Mental illness Other   . Stroke Other   . Coronary artery disease Other     History  Substance Use Topics  . Smoking status: Current Every Day Smoker -- 0.25 packs/day    Types: Cigarettes  . Smokeless tobacco: Never Used     Comment: Decreased to 3 cigarettes a day.  . Alcohol Use: No     Comment: occasional. None since +preg test.    Allergies:  Allergies  Allergen Reactions  . Penicillins     Childhood reaction  . Wellbutrin [Bupropion]     Pt unsure of reaction.    Prescriptions prior to admission  Medication Sig Dispense Refill Last Dose  . Doxylamine-Pyridoxine 10-10 MG TBEC 2 PO qhs; may take 1po in am and 1po in afternoon prn nausea (Patient taking differently: Take 1-2 tablets by mouth 2 (two) times daily. Take 1 tablet in the morning and 2 tablets at bedtime.) 120 tablet 3 08/05/2014 at Unknown time    Review of Systems  Constitutional: Negative.   HENT: Negative.   Respiratory: Negative.   Cardiovascular: Negative.   Gastrointestinal: Positive for nausea,  vomiting, abdominal pain and diarrhea.  Genitourinary: Negative.   Musculoskeletal: Negative.   Skin: Negative.   Neurological: Negative.   Psychiatric/Behavioral: Negative.    Physical Exam   Blood pressure 108/66, pulse 94, temperature 98.2 F (36.8 C), resp. rate 18, height 5\' 4"  (1.626 m), weight 77.928 kg (171 lb 12.8 oz), last menstrual period 06/06/2014, SpO2 99 %, unknown if currently breastfeeding.  Physical Exam  Constitutional: She is oriented to person, place, and time. She appears well-developed and well-nourished. No distress.  HENT:  Head: Normocephalic and atraumatic.  Eyes: Pupils are equal, round, and reactive to light.  Cardiovascular: Normal rate, regular rhythm and normal heart sounds.   Respiratory: Effort normal and breath sounds normal.  GI: Soft. Bowel sounds are normal. She exhibits no distension. There is no tenderness. There is no rebound.  Musculoskeletal: Normal range of motion.  Neurological: She is alert and oriented to person, place, and time.  Skin: Skin is warm and dry.  Psychiatric: She has a normal mood and affect. Her behavior is normal. Judgment and thought content normal.   Results for orders placed or performed during the hospital encounter of 08/06/14 (from the past 24 hour(s))  Urinalysis, Routine w reflex microscopic     Status: Abnormal   Collection Time: 08/06/14  8:05 PM  Result Value Ref Range  Color, Urine YELLOW YELLOW   APPearance CLEAR CLEAR   Specific Gravity, Urine >1.030 (H) 1.005 - 1.030   pH 6.0 5.0 - 8.0   Glucose, UA NEGATIVE NEGATIVE mg/dL   Hgb urine dipstick NEGATIVE NEGATIVE   Bilirubin Urine NEGATIVE NEGATIVE   Ketones, ur NEGATIVE NEGATIVE mg/dL   Protein, ur NEGATIVE NEGATIVE mg/dL   Urobilinogen, UA 0.2 0.0 - 1.0 mg/dL   Nitrite NEGATIVE NEGATIVE   Leukocytes, UA MODERATE (A) NEGATIVE  Urine microscopic-add on     Status: Abnormal   Collection Time: 08/06/14  8:05 PM  Result Value Ref Range   Squamous  Epithelial / LPF RARE RARE   WBC, UA 11-20 <3 WBC/hpf   RBC / HPF 3-6 <3 RBC/hpf   Bacteria, UA FEW (A) RARE    MAU Course  Procedures  MDM  Urine culture Phenergan 25 mg po now  Assessment and Plan   A: Morning sickness  P: Phenergan 25 mg po q8 hours prn nausea Advised Bland diet/ increase fluids Keep appointment with [facility] Return to MAU as needed  Carolynn Serve 08/06/2014, 9:21 PM

## 2014-08-06 NOTE — Progress Notes (Signed)
Written and verbal d/c instructions given and understanding voiced. 

## 2014-08-09 LAB — CULTURE, OB URINE: Colony Count: 50000

## 2014-08-24 ENCOUNTER — Encounter: Payer: Self-pay | Admitting: Women's Health

## 2014-08-24 ENCOUNTER — Ambulatory Visit (INDEPENDENT_AMBULATORY_CARE_PROVIDER_SITE_OTHER): Payer: Medicaid Other | Admitting: Women's Health

## 2014-08-24 VITALS — BP 106/62 | Wt 165.0 lb

## 2014-08-24 DIAGNOSIS — O219 Vomiting of pregnancy, unspecified: Secondary | ICD-10-CM

## 2014-08-24 DIAGNOSIS — Z3491 Encounter for supervision of normal pregnancy, unspecified, first trimester: Secondary | ICD-10-CM | POA: Diagnosis not present

## 2014-08-24 DIAGNOSIS — Z23 Encounter for immunization: Secondary | ICD-10-CM

## 2014-08-24 DIAGNOSIS — Z1389 Encounter for screening for other disorder: Secondary | ICD-10-CM | POA: Diagnosis not present

## 2014-08-24 DIAGNOSIS — Z331 Pregnant state, incidental: Secondary | ICD-10-CM | POA: Diagnosis not present

## 2014-08-24 LAB — POCT URINALYSIS DIPSTICK
GLUCOSE UA: NEGATIVE
Ketones, UA: NEGATIVE
NITRITE UA: NEGATIVE
RBC UA: NEGATIVE

## 2014-08-24 MED ORDER — PROMETHAZINE HCL 12.5 MG RE SUPP: 12.5000 mg | Freq: Four times a day (QID) | RECTAL | Status: DC | PRN

## 2014-08-24 NOTE — Patient Instructions (Signed)

## 2014-08-24 NOTE — Progress Notes (Signed)
Low-risk OB appointment G2P1001 7039w4d Estimated Date of Delivery: 03/18/15 BP 106/62 mmHg  Wt 165 lb (74.844 kg)  LMP 06/06/2014  BP, weight, and urine reviewed.  Refer to obstetrical flow sheet for FH & FHR.  No fm yet. Denies cramping, lof, vb, or uti s/s. N/V, diclegis/phenergan don't stay down. Some food/fluids stay down.  Moist mucous membranes, no ketonuria. Rx phenergan suppositories. If no foods/fluids staying down, not making spit or voiding like normal, to go to hosp for fluids. Call us back if suppositories not helping.  Reviewed warning s/s to report. Plan:  Continue routine obstetrical care  F/U in 4wks for OB appointment

## 2014-09-21 ENCOUNTER — Encounter: Payer: Medicaid Other | Admitting: Advanced Practice Midwife

## 2014-09-21 ENCOUNTER — Encounter: Payer: Self-pay | Admitting: *Deleted

## 2014-09-22 ENCOUNTER — Ambulatory Visit (INDEPENDENT_AMBULATORY_CARE_PROVIDER_SITE_OTHER): Payer: Medicaid Other | Admitting: Advanced Practice Midwife

## 2014-09-22 VITALS — BP 100/50 | Wt 161.5 lb

## 2014-09-22 DIAGNOSIS — Z1389 Encounter for screening for other disorder: Secondary | ICD-10-CM | POA: Diagnosis not present

## 2014-09-22 DIAGNOSIS — Z331 Pregnant state, incidental: Secondary | ICD-10-CM

## 2014-09-22 DIAGNOSIS — Z3492 Encounter for supervision of normal pregnancy, unspecified, second trimester: Secondary | ICD-10-CM

## 2014-09-22 DIAGNOSIS — Z363 Encounter for antenatal screening for malformations: Secondary | ICD-10-CM

## 2014-09-22 LAB — POCT URINALYSIS DIPSTICK
Blood, UA: NEGATIVE
GLUCOSE UA: NEGATIVE
Ketones, UA: NEGATIVE
Leukocytes, UA: NEGATIVE
Nitrite, UA: NEGATIVE

## 2014-09-22 NOTE — Progress Notes (Signed)
G2P1001 6031w5d Estimated Date of Delivery: 03/18/15  Blood pressure 100/50, weight 161 lb 8 oz (73.256 kg), last menstrual period 06/06/2014, unknown if currently breastfeeding.   BP weight and urine results all reviewed and noted.  Please refer to the obstetrical flow sheet for the fundal height and fetal heart rate documentation:  Patient denies any bleeding and no rupture of membranes symptoms or regular contractions. Patient is without complaints. All questions were answered.  Plan:  Continued routine obstetrical care,   Follow up in 4.5 weeks for OB appointment, anatomy scan

## 2014-10-22 ENCOUNTER — Other Ambulatory Visit: Payer: Self-pay | Admitting: Advanced Practice Midwife

## 2014-10-22 DIAGNOSIS — F17210 Nicotine dependence, cigarettes, uncomplicated: Secondary | ICD-10-CM

## 2014-10-22 DIAGNOSIS — Z363 Encounter for antenatal screening for malformations: Secondary | ICD-10-CM

## 2014-10-27 ENCOUNTER — Ambulatory Visit (INDEPENDENT_AMBULATORY_CARE_PROVIDER_SITE_OTHER): Payer: Medicaid Other | Admitting: Women's Health

## 2014-10-27 ENCOUNTER — Encounter: Payer: Self-pay | Admitting: Women's Health

## 2014-10-27 ENCOUNTER — Ambulatory Visit (INDEPENDENT_AMBULATORY_CARE_PROVIDER_SITE_OTHER): Payer: Medicaid Other

## 2014-10-27 VITALS — BP 104/62 | HR 76 | Wt 165.0 lb

## 2014-10-27 DIAGNOSIS — Z331 Pregnant state, incidental: Secondary | ICD-10-CM | POA: Diagnosis not present

## 2014-10-27 DIAGNOSIS — F17210 Nicotine dependence, cigarettes, uncomplicated: Secondary | ICD-10-CM

## 2014-10-27 DIAGNOSIS — Z720 Tobacco use: Secondary | ICD-10-CM

## 2014-10-27 DIAGNOSIS — Z363 Encounter for antenatal screening for malformations: Secondary | ICD-10-CM

## 2014-10-27 DIAGNOSIS — Z3492 Encounter for supervision of normal pregnancy, unspecified, second trimester: Secondary | ICD-10-CM

## 2014-10-27 DIAGNOSIS — Z36 Encounter for antenatal screening of mother: Secondary | ICD-10-CM

## 2014-10-27 DIAGNOSIS — Z1389 Encounter for screening for other disorder: Secondary | ICD-10-CM

## 2014-10-27 LAB — POCT URINALYSIS DIPSTICK
Blood, UA: NEGATIVE
Glucose, UA: NEGATIVE
Ketones, UA: NEGATIVE
Nitrite, UA: NEGATIVE
Protein, UA: NEGATIVE

## 2014-10-27 NOTE — Progress Notes (Signed)
Low-risk OB appointment G2P1001 7047w5d Estimated Date of Delivery: 03/18/15 BP 104/62 mmHg  Pulse 76  Wt 165 lb (74.844 kg)  LMP 06/06/2014  BP, weight, and urine reviewed.  Refer to obstetrical flow sheet for FH & FHR.  Reports no fm yet.  Denies regular uc's, lof, vb, or uti s/s. No complaints. Reviewed today's normal anatomy u/s, ptl s/s, fm. Plan:  Continue routine obstetrical care  F/U in 4wks for OB appointment

## 2014-10-27 NOTE — Progress Notes (Signed)
US 6740w5d measurements c/w dates,cephalic,ant pl, rt simple corpus luteal cyst 2.5 x 2.4 x 2.1cm,normal lt ov, sdp of fluid 6.2cm,cx appears closed 3.6cm,anatomy complete w/ no obvious abnormalities seen.

## 2014-10-27 NOTE — Patient Instructions (Signed)
Circumcision: $507 at hospital, $244 at [facility], has to be paid up front before it is done. If you want the circumcision done at [facility] you can make payments during pregnancy. If you are interested in this, see receptionist at check-out.  If your baby is older than 28 days when you have the circumcision done at [facility], the fee will go up to $325.50.    Second Trimester of Pregnancy The second trimester is from week 13 through week 28, months 4 through 6. The second trimester is often a time when you feel your best. Your body has also adjusted to being pregnant, and you begin to feel better physically. Usually, morning sickness has lessened or quit completely, you may have more energy, and you may have an increase in appetite. The second trimester is also a time when the fetus is growing rapidly. At the end of the sixth month, the fetus is about 9 inches long and weighs about 1 pounds. You will likely begin to feel the baby move (quickening) between 18 and 20 weeks of the pregnancy. BODY CHANGES Your body goes through many changes during pregnancy. The changes vary from woman to woman.   Your weight will continue to increase. You will notice your lower abdomen bulging out.  You may begin to get stretch marks on your hips, abdomen, and breasts.  You may develop headaches that can be relieved by medicines approved by your health care provider.  You may urinate more often because the fetus is pressing on your bladder.  You may develop or continue to have heartburn as a result of your pregnancy.  You may develop constipation because certain hormones are causing the muscles that push waste through your intestines to slow down.  You may develop hemorrhoids or swollen, bulging veins (varicose veins).  You may have back pain because of the weight gain and pregnancy hormones relaxing your joints between the bones in your pelvis and as a result of a shift in weight and the muscles that  support your balance.  Your breasts will continue to grow and be tender.  Your gums may bleed and may be sensitive to brushing and flossing.  Dark spots or blotches (chloasma, mask of pregnancy) may develop on your face. This will likely fade after the baby is born.  A dark line from your belly button to the pubic area (linea nigra) may appear. This will likely fade after the baby is born.  You may have changes in your hair. These can include thickening of your hair, rapid growth, and changes in texture. Some women also have hair loss during or after pregnancy, or hair that feels dry or thin. Your hair will most likely return to normal after your baby is born. WHAT TO EXPECT AT YOUR PRENATAL VISITS During a routine prenatal visit:  You will be weighed to make sure you and the fetus are growing normally.  Your blood pressure will be taken.  Your abdomen will be measured to track your baby's growth.  The fetal heartbeat will be listened to.  Any test results from the previous visit will be discussed. Your health care provider may ask you:  How you are feeling.  If you are feeling the baby move.  If you have had any abnormal symptoms, such as leaking fluid, bleeding, severe headaches, or abdominal cramping.  If you have any questions. Other tests that may be performed during your second trimester include:  Blood tests that check for:  Low iron levels (anemia).  Gestational diabetes (between 24 and 28 weeks).  Rh antibodies.  Urine tests to check for infections, diabetes, or protein in the urine.  An ultrasound to confirm the proper growth and development of the baby.  An amniocentesis to check for possible genetic problems.  Fetal screens for spina bifida and Down syndrome. HOME CARE INSTRUCTIONS   Avoid all smoking, herbs, alcohol, and unprescribed drugs. These chemicals affect the formation and growth of the baby.  Follow your health care provider's instructions  regarding medicine use. There are medicines that are either safe or unsafe to take during pregnancy.  Exercise only as directed by your health care provider. Experiencing uterine cramps is a good sign to stop exercising.  Continue to eat regular, healthy meals.  Wear a good support bra for breast tenderness.  Do not use hot tubs, steam rooms, or saunas.  Wear your seat belt at all times when driving.  Avoid raw meat, uncooked cheese, cat litter boxes, and soil used by cats. These carry germs that can cause birth defects in the baby.  Take your prenatal vitamins.  Try taking a stool softener (if your health care provider approves) if you develop constipation. Eat more high-fiber foods, such as fresh vegetables or fruit and whole grains. Drink plenty of fluids to keep your urine clear or pale yellow.  Take warm sitz baths to soothe any pain or discomfort caused by hemorrhoids. Use hemorrhoid cream if your health care provider approves.  If you develop varicose veins, wear support hose. Elevate your feet for 15 minutes, 3-4 times a day. Limit salt in your diet.  Avoid heavy lifting, wear low heel shoes, and practice good posture.  Rest with your legs elevated if you have leg cramps or low back pain.  Visit your dentist if you have not gone yet during your pregnancy. Use a soft toothbrush to brush your teeth and be gentle when you floss.  A sexual relationship may be continued unless your health care provider directs you otherwise.  Continue to go to all your prenatal visits as directed by your health care provider. SEEK MEDICAL CARE IF:   You have dizziness.  You have mild pelvic cramps, pelvic pressure, or nagging pain in the abdominal area.  You have persistent nausea, vomiting, or diarrhea.  You have a bad smelling vaginal discharge.  You have pain with urination. SEEK IMMEDIATE MEDICAL CARE IF:   You have a fever.  You are leaking fluid from your vagina.  You have  spotting or bleeding from your vagina.  You have severe abdominal cramping or pain.  You have rapid weight gain or loss.  You have shortness of breath with chest pain.  You notice sudden or extreme swelling of your face, hands, ankles, feet, or legs.  You have not felt your baby move in over an hour.  You have severe headaches that do not go away with medicine.  You have vision changes. Document Released: 06/12/2001 Document Revised: 06/23/2013 Document Reviewed: 08/19/2012 [facility] Patient Information 2015 [place], [place]. This information is not intended to replace advice given to you by your health care provider. Make sure you discuss any questions you have with your health care provider.

## 2014-11-24 ENCOUNTER — Encounter: Payer: Medicaid Other | Admitting: Advanced Practice Midwife

## 2015-05-17 ENCOUNTER — Encounter (HOSPITAL_COMMUNITY): Payer: Self-pay | Admitting: *Deleted

## 2016-03-13 ENCOUNTER — Emergency Department (HOSPITAL_COMMUNITY)
Admission: EM | Admit: 2016-03-13 | Discharge: 2016-03-13 | Disposition: A | Discharge status: Home / Self Care | Payer: Medicaid Other | Attending: Emergency Medicine | Admitting: Emergency Medicine

## 2016-03-13 ENCOUNTER — Encounter (HOSPITAL_COMMUNITY): Payer: Self-pay | Admitting: Emergency Medicine

## 2016-03-13 DIAGNOSIS — F17210 Nicotine dependence, cigarettes, uncomplicated: Secondary | ICD-10-CM | POA: Diagnosis not present

## 2016-03-13 DIAGNOSIS — J020 Streptococcal pharyngitis: Secondary | ICD-10-CM | POA: Diagnosis not present

## 2016-03-13 DIAGNOSIS — Z79899 Other long term (current) drug therapy: Secondary | ICD-10-CM | POA: Insufficient documentation

## 2016-03-13 DIAGNOSIS — R509 Fever, unspecified: Secondary | ICD-10-CM | POA: Diagnosis present

## 2016-03-13 DIAGNOSIS — M545 Low back pain: Secondary | ICD-10-CM | POA: Diagnosis not present

## 2016-03-13 LAB — URINE MICROSCOPIC-ADD ON

## 2016-03-13 LAB — URINALYSIS, ROUTINE W REFLEX MICROSCOPIC
Bilirubin Urine: NEGATIVE
Glucose, UA: NEGATIVE mg/dL
LEUKOCYTES UA: NEGATIVE
NITRITE: NEGATIVE
PROTEIN: NEGATIVE mg/dL
Specific Gravity, Urine: 1.025 (ref 1.005–1.030)
pH: 6 (ref 5.0–8.0)

## 2016-03-13 LAB — RAPID STREP SCREEN (MED CTR MEBANE ONLY): STREPTOCOCCUS, GROUP A SCREEN (DIRECT): POSITIVE — AB

## 2016-03-13 MED ORDER — IBUPROFEN 800 MG PO TABS
800.0000 mg | ORAL_TABLET | Freq: Once | ORAL | Status: AC
  Administered 2016-03-13: 800 mg via ORAL
  Filled 2016-03-13: qty 1

## 2016-03-13 MED ORDER — CEPHALEXIN 500 MG PO CAPS
500.0000 mg | ORAL_CAPSULE | Freq: Once | ORAL | Status: AC
  Administered 2016-03-13: 500 mg via ORAL
  Filled 2016-03-13: qty 1

## 2016-03-13 MED ORDER — IBUPROFEN 400 MG PO TABS: 400.0000 mg | ORAL_TABLET | Freq: Four times a day (QID) | ORAL | 0 refills | Status: DC | PRN

## 2016-03-13 MED ORDER — CEPHALEXIN 500 MG PO CAPS: ORAL_CAPSULE | ORAL | 0 refills | Status: DC

## 2016-03-13 NOTE — ED Provider Notes (Signed)
[facility]-EMERGENCY DEPT Provider Note   CSN: [id number] Arrival date & time: 03/13/16  1923  By signing my name below, I, Marissa Marissa Friedman, attest that this documentation has been prepared under the direction and in the presence of Marissa Memos, MD . Electronically Signed: Christy Marissa Friedman, Scribe. 03/13/2016. 9:04 PM.  History   Chief Complaint Chief Complaint  Patient presents with  . Fever    HPI DAMILOLA Marissa Friedman is a 20 y.o. female.  The history is provided by the patient and medical records. No language interpreter was used.    HPI Comments:  Marissa Marissa Friedman is a 20 y.o. female who presents to the Emergency Department complaining of a headache and diffuse back pain beginning 2 days ago.  She states her head feels like its on fire.  She reports associated diaphoresis, sore throat, ear pain, frequency of urination and nasal congestion.  She took dayquil and nyquil without relief.  She has had UTIs in the past and states this doesn't feel similar.  She denies any medical conditions, contact with sick individuals or recent travels.  She also denies cough, localized pain in her back, rash or abdominal pain.  She works at [employer].     Past Medical History:  Diagnosis Date  . Anxiety     Patient Active Problem List   Diagnosis Date Noted  . Smoker 08/06/2014  . Supervision of normal pregnancy 07/27/2014    Past Surgical History:  Procedure Laterality Date  . NO PAST SURGERIES      OB History    Gravida Para Term Preterm AB Living   2 1 1     1    SAB TAB Ectopic Multiple Live Births           1      Obstetric Comments   [facility]       Home Medications    Prior to Admission medications   Medication Sig Start Date End Date Taking? Authorizing Provider  acetaminophen (TYLENOL) 500 MG tablet Take 500 mg by mouth every 6 (six) hours as needed for mild pain or moderate pain.   Yes Historical Provider, MD  cephALEXin (KEFLEX) 500  MG capsule 2 caps po bid x 7 days 03/13/16   Marissa Memos, MD  ibuprofen (ADVIL,MOTRIN) 400 MG tablet Take 1 tablet (400 mg total) by mouth every 6 (six) hours as needed. 03/13/16   Marissa Memos, MD    Family History Family History  Problem Relation Age of Onset  . Diabetes Other   . Hypertension Other   . Mental illness Other   . Stroke Other   . Coronary artery disease Other     Social History Social History  Substance Use Topics  . Smoking status: Current Some Day Smoker    Packs/day: 0.25    Types: Cigarettes  . Smokeless tobacco: Never Used     Comment: Decreased to 3 cigarettes a day.  . Alcohol use No     Comment: occasional. None since +preg test.     Allergies   Penicillins and Wellbutrin [bupropion]   Review of Systems Review of Systems  Constitutional: Positive for chills and diaphoresis.  HENT: Positive for ear pain.   Respiratory: Negative for cough.   Gastrointestinal: Negative for abdominal pain.  Musculoskeletal: Positive for back pain.  Skin: Negative for rash.  Neurological: Positive for headaches.     Physical Exam Updated Vital Signs BP 103/61   Pulse 97   Temp 98.7  F (37.1 C)   Resp 17   Ht 5\' 4"  (1.626 m)   Wt 160 lb (72.6 kg)   LMP 03/13/2016   SpO2 98%   BMI 27.46 kg/m   Physical Exam  Constitutional: She is oriented to person, place, and time. She appears well-developed and well-nourished. No distress.  HENT:  Head: Normocephalic and atraumatic.  Right Ear: External ear normal.  Left Ear: External ear normal.  Tonsils 2+  Oropharyngeal  edema and erythema.  Tonsillar exudate.  Dried nasal mucous.    Eyes: Conjunctivae are normal.  Cardiovascular: Normal rate.   Pulmonary/Chest: Effort normal.  Neurological: She is alert and oriented to person, place, and time.  Skin: Skin is warm and dry.  Psychiatric: She has a normal mood and affect.  Nursing note and vitals reviewed.    ED Treatments / Results   DIAGNOSTIC  STUDIES:  Oxygen Saturation is 99% on RA, NML by my interpretation.    COORDINATION OF CARE:  9:06 PM Discussed treatment plan with pt at bedside and pt agreed to plan.  Labs (all labs ordered are listed, but only abnormal results are displayed) Labs Reviewed  RAPID STREP SCREEN (NOT AT [facility]) - Abnormal; Notable for the following:       Result Value   Streptococcus, Group A Screen (Direct) POSITIVE (*)    All other components within normal limits  URINALYSIS, ROUTINE W REFLEX MICROSCOPIC (NOT AT [facility]) - Abnormal; Notable for the following:    Hgb urine dipstick LARGE (*)    Ketones, ur >80 (*)    All other components within normal limits  URINE MICROSCOPIC-ADD ON - Abnormal; Notable for the following:    Squamous Epithelial / LPF 6-30 (*)    Bacteria, UA FEW (*)    All other components within normal limits    EKG  EKG Interpretation None       Radiology No results found.  Procedures Procedures (including critical care time)  Medications Ordered in ED Medications  ibuprofen (ADVIL,MOTRIN) tablet 800 mg (800 mg Oral Given 03/13/16 2120)  cephALEXin (KEFLEX) capsule 500 mg (500 mg Oral Given 03/13/16 2207)     Initial Impression / Assessment and Plan / ED Course  I have reviewed the triage vital signs and the nursing notes.  Pertinent labs & imaging results that were available during my care of the patient were reviewed by me and considered in my medical decision making (see chart for details).  Clinical Course    Strep pharyngitis as likely cause for symptoms. No UTI. No e/o complications. Plan for discharge on abx as she didn't want a shot.   Final Clinical Impressions(s) / ED Diagnoses   Final diagnoses:  Strep pharyngitis    New Prescriptions Discharge Medication List as of 03/13/2016 10:55 PM    START taking these medications   Details  cephALEXin (KEFLEX) 500 MG capsule 2 caps po bid x 7 days, Print    ibuprofen (ADVIL,MOTRIN) 400 MG tablet Take 1  tablet (400 mg total) by mouth every 6 (six) hours as needed., Starting Tue 03/13/2016, Print       I personally performed the services described in this documentation, which was scribed in my presence. The recorded information has been reviewed and is accurate.    Marissa MemosJason Jalayah Gutridge, MD 03/13/16 [phone]

## 2016-03-13 NOTE — ED Triage Notes (Signed)
Pt c/o lower back pain, ear pain, sore throat, dizziness and fever x 2 days.

## 2016-03-19 IMAGING — US US OB COMP LESS 14 WK
1 series · 13 of 28 positions shown · non-contrast
Comparison: None.

ADDENDUM:
CLINICAL DATA: Pain. Quantitative beta HCG is [DATE]. By LMP the
patient is 5 weeks 1 day. EDC is 03/12/2014 by LMP.
CLINICAL DATA: Pain. Quantitative beta HCG is 2,208 knee. By LMP
the patient is 5 weeks 1 day. EDC is 03/12/2014 by LMP.

EXAM:
OBSTETRIC <14 WK US AND TRANSVAGINAL OB US
TECHNIQUE: Both transabdominal and transvaginal ultrasound examinations were
performed for complete evaluation of the gestation as well as the
maternal uterus, adnexal regions, and pelvic cul-de-sac.
Transvaginal technique was performed to assess early pregnancy.

[Series 1: us ob transvaginal · 13 of 59 slices shown]
[im 3/59]
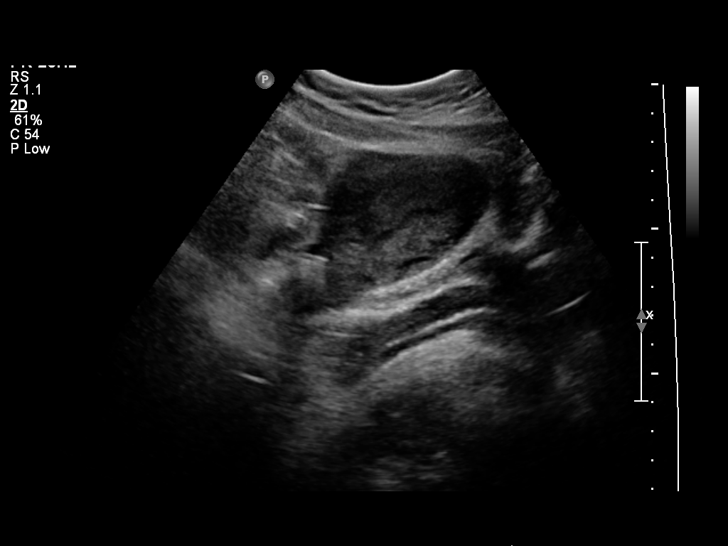
[im 7/59]
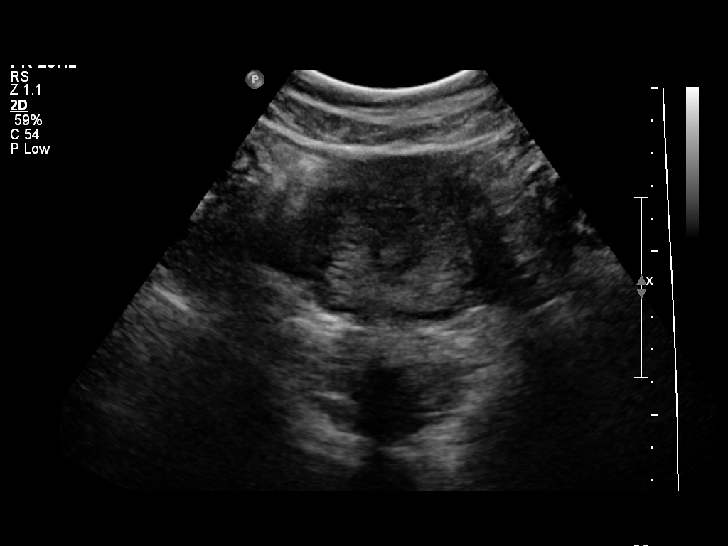
[im 11/59]
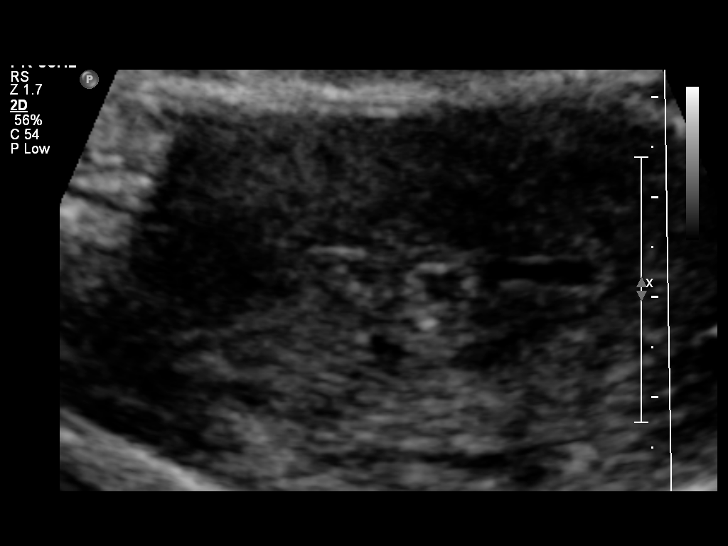
[im 16/59]
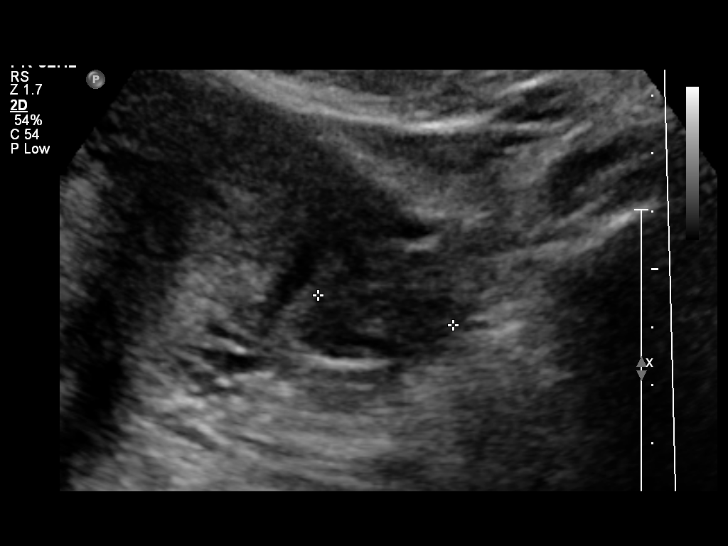
[im 20/59]
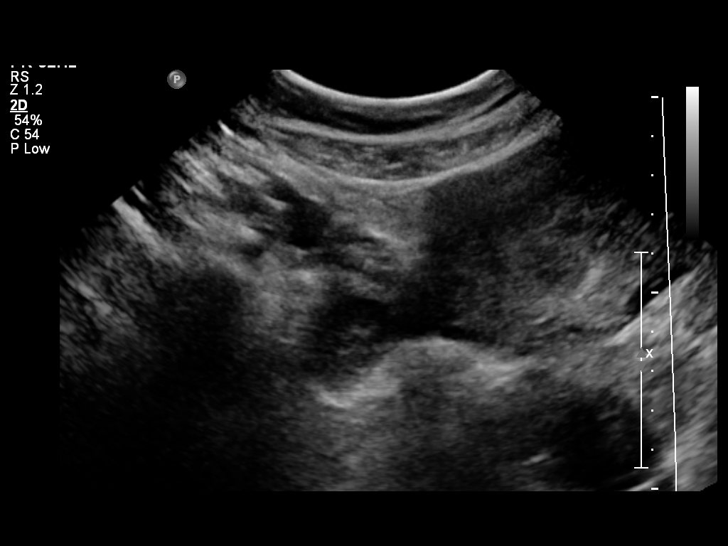
[im 24/59]
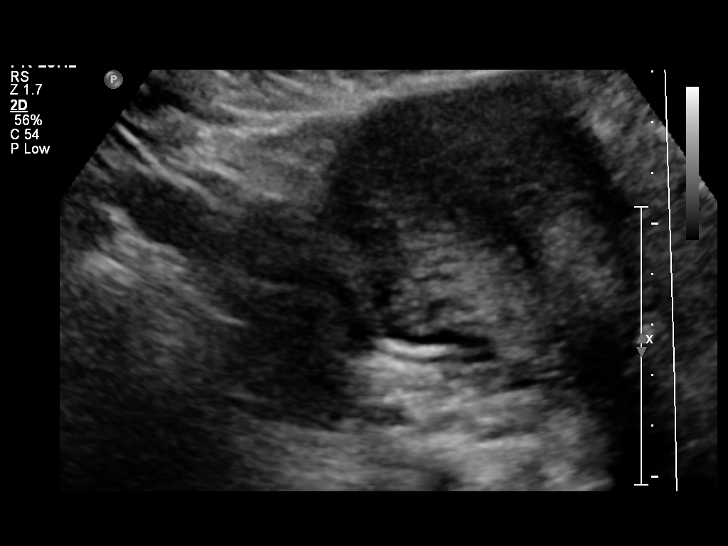
[im 31/59]
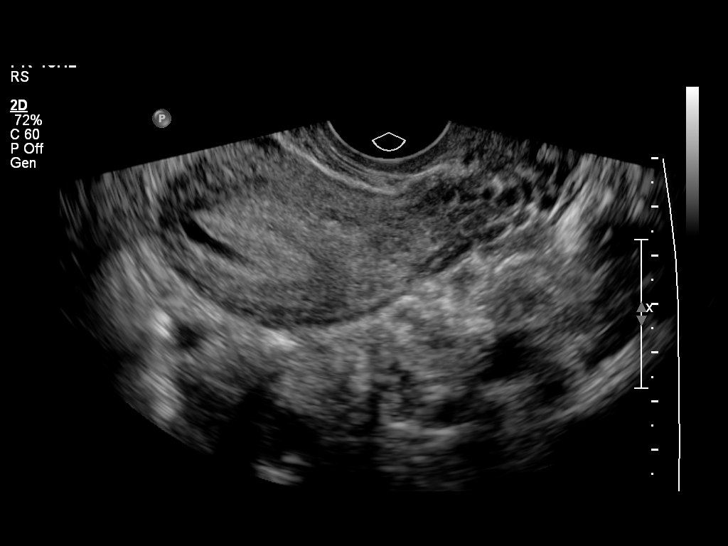
[im 35/59]
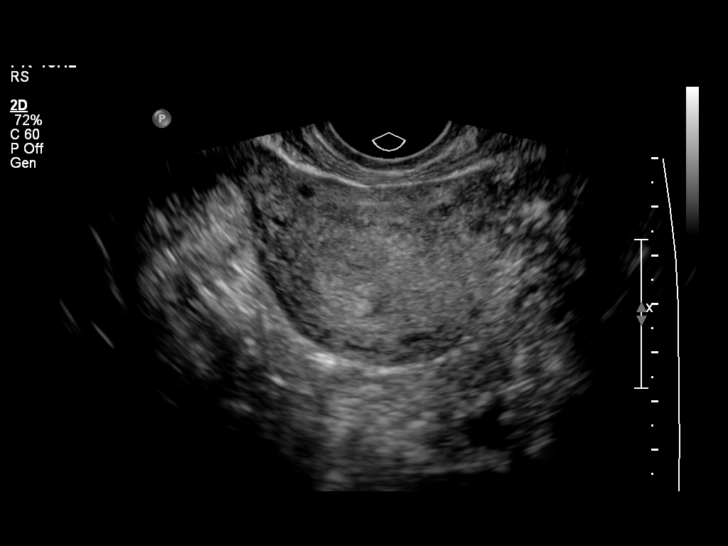
[im 39/59]
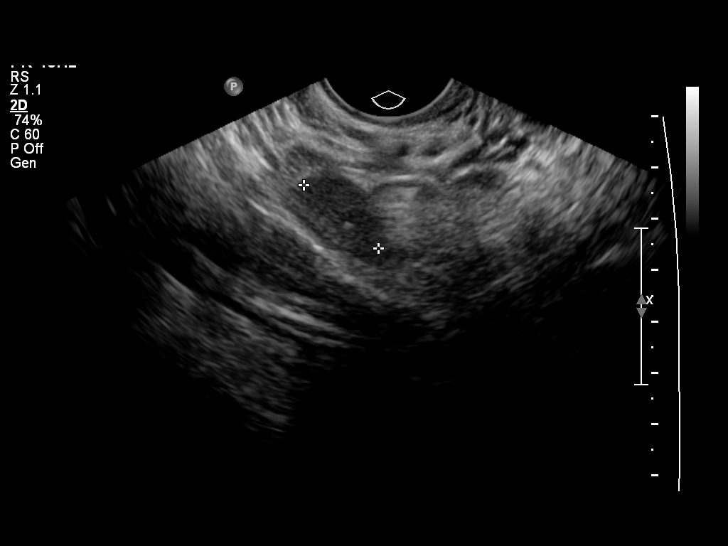
[im 43/59]
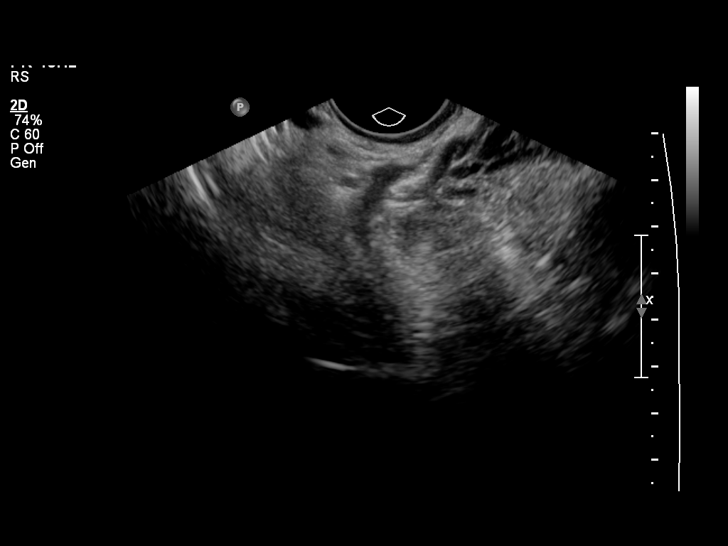
[im 48/59]
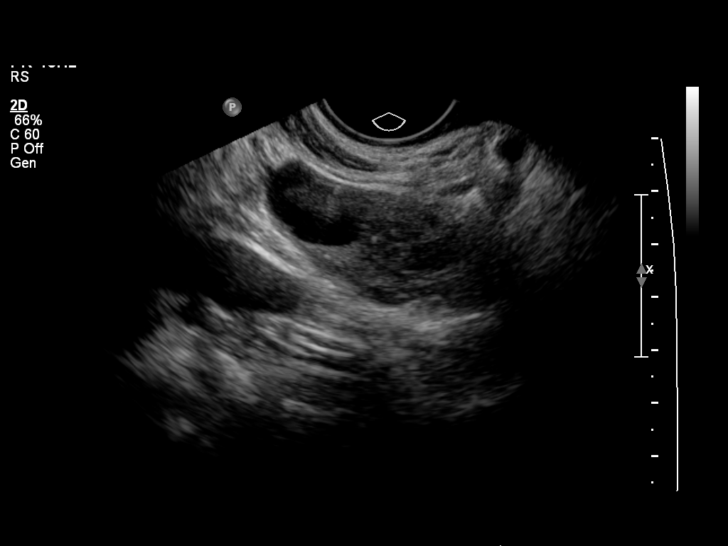
[im 52/59]
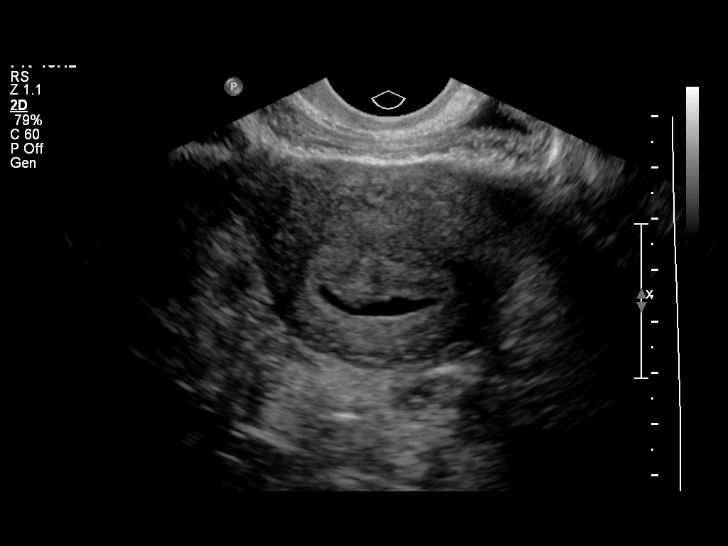
[im 56/59]
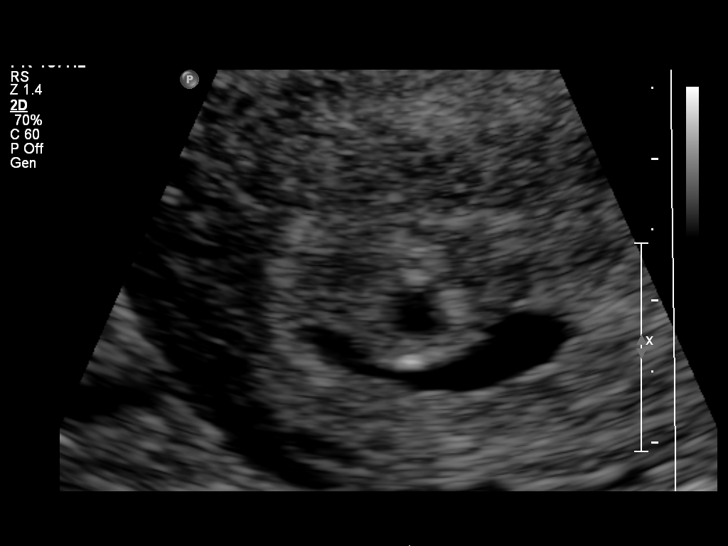

[13 of 28 positions shown; findings below may reference images not displayed]

FINDINGS: Intrauterine gestational sac: Likely present

Yolk sac:  Not seen

Embryo:  Not seen

Cardiac Activity: Not seen

MSD:  3.5  mm   4 w   6  d

Maternal uterus/adnexae: Moderate subchorionic hemorrhage or fluid
identified. A right corpus luteum cyst is present. Left ovary has a
normal appearance.
IMPRESSION: Probable early intrauterine gestational sac, but no yolk sac, fetal
pole, or cardiac activity yet visualized. Recommend follow-up
quantitative B-HCG levels and follow-up US in 14 days to confirm and
assess viability. This recommendation follows SRU consensus
guidelines: Diagnostic Criteria for Nonviable Pregnancy Early in the
First Trimester. N Engl J Med 1572; [DATE].

## 2016-08-09 ENCOUNTER — Encounter (HOSPITAL_COMMUNITY): Payer: Self-pay | Admitting: Emergency Medicine

## 2016-08-09 ENCOUNTER — Emergency Department (HOSPITAL_COMMUNITY)
Admission: EM | Admit: 2016-08-09 | Discharge: 2016-08-09 | Disposition: A | Discharge status: Home / Self Care | Payer: Medicaid Other | Attending: Emergency Medicine | Admitting: Emergency Medicine

## 2016-08-09 DIAGNOSIS — K029 Dental caries, unspecified: Secondary | ICD-10-CM | POA: Insufficient documentation

## 2016-08-09 DIAGNOSIS — K0889 Other specified disorders of teeth and supporting structures: Secondary | ICD-10-CM

## 2016-08-09 DIAGNOSIS — F17210 Nicotine dependence, cigarettes, uncomplicated: Secondary | ICD-10-CM | POA: Insufficient documentation

## 2016-08-09 MED ORDER — IBUPROFEN 800 MG PO TABS: 800.0000 mg | ORAL_TABLET | Freq: Three times a day (TID) | ORAL | 0 refills | Status: DC

## 2016-08-09 MED ORDER — CLINDAMYCIN HCL 300 MG PO CAPS: 300.0000 mg | ORAL_CAPSULE | Freq: Four times a day (QID) | ORAL | 0 refills | Status: DC

## 2016-08-09 NOTE — ED Provider Notes (Signed)
[facility] DEPT Provider Note   CSN: [id number] Arrival date & time: 08/09/16  1524     History   Chief Complaint Chief Complaint  Patient presents with  . Dental Problem    HPI Marissa Friedman is a 21 y.o. female.  HPI   Marissa Friedman is a 21 y.o. female who presents to the Emergency Department complaining of dental pain for 2 days.  She states that she noticed a left lower tooth "chipped off" a few days ago before the pain began.  She describes a sharp, stabbing pain to her left face that radiates to her ear.  Pain is worse with chewing.  She denies fever, neck pain, facial swelling, and difficulty opening or closing her mouth.     Past Medical History:  Diagnosis Date  . Anxiety     Patient Active Problem List   Diagnosis Date Noted  . Smoker 08/06/2014  . Supervision of normal pregnancy 07/27/2014    Past Surgical History:  Procedure Laterality Date  . NO PAST SURGERIES      OB History    Gravida Para Term Preterm AB Living   2 1 1     1    SAB TAB Ectopic Multiple Live Births           1      Obstetric Comments   [facility]       Home Medications    Prior to Admission medications   Medication Sig Start Date End Date Taking? Authorizing Provider  acetaminophen (TYLENOL) 500 MG tablet Take 500 mg by mouth every 6 (six) hours as needed for mild pain or moderate pain.    Historical Provider, MD  cephALEXin (KEFLEX) 500 MG capsule 2 caps po bid x 7 days 03/13/16   Marily MemosJason Mesner, MD  ibuprofen (ADVIL,MOTRIN) 400 MG tablet Take 1 tablet (400 mg total) by mouth every 6 (six) hours as needed. 03/13/16   Marily MemosJason Mesner, MD    Family History Family History  Problem Relation Age of Onset  . Diabetes Other   . Hypertension Other   . Mental illness Other   . Stroke Other   . Coronary artery disease Other     Social History Social History  Substance Use Topics  . Smoking status: Current Some Day Smoker    Packs/day: 0.25    Types:  Cigarettes  . Smokeless tobacco: Never Used     Comment: Decreased to 3 cigarettes a day.  . Alcohol use No     Comment: occasional. None since +preg test.     Allergies   Penicillins and Wellbutrin [bupropion]   Review of Systems Review of Systems  Constitutional: Negative for appetite change and fever.  HENT: Positive for dental problem. Negative for congestion, facial swelling, sore throat and trouble swallowing.   Eyes: Negative for pain and visual disturbance.  Musculoskeletal: Negative for neck pain and neck stiffness.  Neurological: Negative for dizziness, facial asymmetry and headaches.  Hematological: Negative for adenopathy.  All other systems reviewed and are negative.    Physical Exam Updated Vital Signs BP 128/85 (BP Location: Left Arm)   Pulse 99   Temp 98.4 F (36.9 C) (Oral)   Resp 16   Ht 5\' 4"  (1.626 m)   Wt 70.3 kg   LMP 07/19/2016 (Exact Date)   SpO2 100%   BMI 26.61 kg/m   Physical Exam  Constitutional: She is oriented to person, place, and time. She appears well-developed and well-nourished.  No distress.  HENT:  Head: Normocephalic and atraumatic.  Right Ear: Tympanic membrane and ear canal normal.  Left Ear: Tympanic membrane and ear canal normal.  Mouth/Throat: Uvula is midline, oropharynx is clear and moist and mucous membranes are normal. No trismus in the jaw. Dental caries present. No dental abscesses or uvula swelling.  Tenderness and dental caries of the left lower second premolar.  No facial swelling, obvious dental abscess, trismus, or sublingual abnml.    Neck: Normal range of motion. Neck supple.  Cardiovascular: Normal rate, regular rhythm and normal heart sounds.   No murmur heard. Pulmonary/Chest: Effort normal and breath sounds normal.  Musculoskeletal: Normal range of motion.  Lymphadenopathy:    She has no cervical adenopathy.  Neurological: She is alert and oriented to person, place, and time. She exhibits normal muscle  tone. Coordination normal.  Skin: Skin is warm and dry.  Nursing note and vitals reviewed.    ED Treatments / Results  Labs (all labs ordered are listed, but only abnormal results are displayed) Labs Reviewed - No data to display  EKG  EKG Interpretation None       Radiology No results found.  Procedures Procedures (including critical care time)  Medications Ordered in ED Medications - No data to display   Initial Impression / Assessment and Plan / ED Course  I have reviewed the triage vital signs and the nursing notes.  Pertinent labs & imaging results that were available during my care of the patient were reviewed by me and considered in my medical decision making (see chart for details).     Pt is well appearing.  No obvious dental abscess or Ludwig's angina.  Referral info given for dentist.  rx for clinda and ibuprofen.  Appears stable for d/c  Final Clinical Impressions(s) / ED Diagnoses   Final diagnoses:  Pain, dental    New Prescriptions New Prescriptions   No medications on file     Pauline Aus, Cordelia Poche 08/09/16 1744    Marily Memos, MD 08/09/16 2325

## 2016-08-09 NOTE — Discharge Instructions (Signed)
Follow-up with a dentist soon.   °

## 2016-08-09 NOTE — ED Triage Notes (Signed)
Dental pain to left lower tooth.  Rates pain 6/10.  C/o left ear pain (4/10). Swelling noted to left lower jaw.

## 2017-03-12 ENCOUNTER — Ambulatory Visit: Payer: Self-pay | Admitting: Adult Health

## 2017-03-20 ENCOUNTER — Ambulatory Visit: Payer: Self-pay | Admitting: Adult Health

## 2017-03-20 ENCOUNTER — Encounter (INDEPENDENT_AMBULATORY_CARE_PROVIDER_SITE_OTHER): Payer: Self-pay

## 2017-03-25 ENCOUNTER — Encounter: Payer: Self-pay | Admitting: Adult Health

## 2017-03-25 ENCOUNTER — Ambulatory Visit (INDEPENDENT_AMBULATORY_CARE_PROVIDER_SITE_OTHER): Payer: Medicaid Other | Admitting: Adult Health

## 2017-03-25 VITALS — BP 128/58 | HR 72 | Ht 64.0 in | Wt 185.0 lb

## 2017-03-25 DIAGNOSIS — Z3201 Encounter for pregnancy test, result positive: Secondary | ICD-10-CM | POA: Diagnosis not present

## 2017-03-25 DIAGNOSIS — Z3A01 Less than 8 weeks gestation of pregnancy: Secondary | ICD-10-CM

## 2017-03-25 DIAGNOSIS — O3680X0 Pregnancy with inconclusive fetal viability, not applicable or unspecified: Secondary | ICD-10-CM

## 2017-03-25 DIAGNOSIS — N926 Irregular menstruation, unspecified: Secondary | ICD-10-CM

## 2017-03-25 LAB — POCT URINE PREGNANCY: PREG TEST UR: POSITIVE — AB

## 2017-03-25 MED ORDER — PRENATAL GUMMIES/DHA & FA 0.4-32.5 MG PO CHEW: 1.0000 | CHEWABLE_TABLET | Freq: Two times a day (BID) | ORAL | Status: DC

## 2017-03-25 NOTE — Patient Instructions (Signed)
First Trimester of Pregnancy The first trimester of pregnancy is from week 1 until the end of week 13 (months 1 through 3). A week after a sperm fertilizes an egg, the egg will implant on the wall of the uterus. This embryo will begin to develop into a baby. Genes from you and your partner will form the baby. The female genes will determine whether the baby will be a boy or a girl. At 6-8 weeks, the eyes and face will be formed, and the heartbeat can be seen on ultrasound. At the end of 12 weeks, all the baby's organs will be formed. Now that you are pregnant, you will want to do everything you can to have a healthy baby. Two of the most important things are to get good prenatal care and to follow your health care provider's instructions. Prenatal care is all the medical care you receive before the baby's birth. This care will help prevent, find, and treat any problems during the pregnancy and childbirth. Body changes during your first trimester Your body goes through many changes during pregnancy. The changes vary from woman to woman.  You may gain or lose a couple of pounds at first.  You may feel sick to your stomach (nauseous) and you may throw up (vomit). If the vomiting is uncontrollable, call your health care provider.  You may tire easily.  You may develop headaches that can be relieved by medicines. All medicines should be approved by your health care provider.  You may urinate more often. Painful urination may mean you have a bladder infection.  You may develop heartburn as a result of your pregnancy.  You may develop constipation because certain hormones are causing the muscles that push stool through your intestines to slow down.  You may develop hemorrhoids or swollen veins (varicose veins).  Your breasts may begin to grow larger and become tender. Your nipples may stick out more, and the tissue that surrounds them (areola) may become darker.  Your gums may bleed and may be  sensitive to brushing and flossing.  Dark spots or blotches (chloasma, mask of pregnancy) may develop on your face. This will likely fade after the baby is born.  Your menstrual periods will stop.  You may have a loss of appetite.  You may develop cravings for certain kinds of food.  You may have changes in your emotions from day to day, such as being excited to be pregnant or being concerned that something may go wrong with the pregnancy and baby.  You may have more vivid and strange dreams.  You may have changes in your hair. These can include thickening of your hair, rapid growth, and changes in texture. Some women also have hair loss during or after pregnancy, or hair that feels dry or thin. Your hair will most likely return to normal after your baby is born.  What to expect at prenatal visits During a routine prenatal visit:  You will be weighed to make sure you and the baby are growing normally.  Your blood pressure will be taken.  Your abdomen will be measured to track your baby's growth.  The fetal heartbeat will be listened to between weeks 10 and 14 of your pregnancy.  Test results from any previous visits will be discussed.  Your health care provider may ask you:  How you are feeling.  If you are feeling the baby move.  If you have had any abnormal symptoms, such as leaking fluid, bleeding, severe headaches,   or abdominal cramping.  If you are using any tobacco products, including cigarettes, chewing tobacco, and electronic cigarettes.  If you have any questions.  Other tests that may be performed during your first trimester include:  Blood tests to find your blood type and to check for the presence of any previous infections. The tests will also be used to check for low iron levels (anemia) and protein on red blood cells (Rh antibodies). Depending on your risk factors, or if you previously had diabetes during pregnancy, you may have tests to check for high blood  sugar that affects pregnant women (gestational diabetes).  Urine tests to check for infections, diabetes, or protein in the urine.  An ultrasound to confirm the proper growth and development of the baby.  Fetal screens for spinal cord problems (spina bifida) and Down syndrome.  HIV (human immunodeficiency virus) testing. Routine prenatal testing includes screening for HIV, unless you choose not to have this test.  You may need other tests to make sure you and the baby are doing well.  Follow these instructions at home: Medicines  Follow your health care provider's instructions regarding medicine use. Specific medicines may be either safe or unsafe to take during pregnancy.  Take a prenatal vitamin that contains at least 600 micrograms (mcg) of folic acid.  If you develop constipation, try taking a stool softener if your health care provider approves. Eating and drinking  Eat a balanced diet that includes fresh fruits and vegetables, whole grains, good sources of protein such as meat, eggs, or tofu, and low-fat dairy. Your health care provider will help you determine the amount of weight gain that is right for you.  Avoid raw meat and uncooked cheese. These carry germs that can cause birth defects in the baby.  Eating four or five small meals rather than three large meals a day may help relieve nausea and vomiting. If you start to feel nauseous, eating a few soda crackers can be helpful. Drinking liquids between meals, instead of during meals, also seems to help ease nausea and vomiting.  Limit foods that are high in fat and processed sugars, such as fried and sweet foods.  To prevent constipation: ? Eat foods that are high in fiber, such as fresh fruits and vegetables, whole grains, and beans. ? Drink enough fluid to keep your urine clear or pale yellow. Activity  Exercise only as directed by your health care provider. Most women can continue their usual exercise routine during  pregnancy. Try to exercise for 30 minutes at least 5 days a week. Exercising will help you: ? Control your weight. ? Stay in shape. ? Be prepared for labor and delivery.  Experiencing pain or cramping in the lower abdomen or lower back is a good sign that you should stop exercising. Check with your health care provider before continuing with normal exercises.  Try to avoid standing for long periods of time. Move your legs often if you must stand in one place for a long time.  Avoid heavy lifting.  Wear low-heeled shoes and practice good posture.  You may continue to have sex unless your health care provider tells you not to. Relieving pain and discomfort  Wear a good support bra to relieve breast tenderness.  Take warm sitz baths to soothe any pain or discomfort caused by hemorrhoids. Use hemorrhoid cream if your health care provider approves.  Rest with your legs elevated if you have leg cramps or low back pain.  If you develop   varicose veins in your legs, wear support hose. Elevate your feet for 15 minutes, 3-4 times a day. Limit salt in your diet. Prenatal care  Schedule your prenatal visits by the twelfth week of pregnancy. They are usually scheduled monthly at first, then more often in the last 2 months before delivery.  Write down your questions. Take them to your prenatal visits.  Keep all your prenatal visits as told by your health care provider. This is important. Safety  Wear your seat belt at all times when driving.  Make a list of emergency phone numbers, including numbers for family, friends, the hospital, and police and fire departments. General instructions  Ask your health care provider for a referral to a local prenatal education class. Begin classes no later than the beginning of month 6 of your pregnancy.  Ask for help if you have counseling or nutritional needs during pregnancy. Your health care provider can offer advice or refer you to specialists for help  with various needs.  Do not use hot tubs, steam rooms, or saunas.  Do not douche or use tampons or scented sanitary pads.  Do not cross your legs for long periods of time.  Avoid cat litter boxes and soil used by cats. These carry germs that can cause birth defects in the baby and possibly loss of the fetus by miscarriage or stillbirth.  Avoid all smoking, herbs, alcohol, and medicines not prescribed by your health care provider. Chemicals in these products affect the formation and growth of the baby.  Do not use any products that contain nicotine or tobacco, such as cigarettes and e-cigarettes. If you need help quitting, ask your health care provider. You may receive counseling support and other resources to help you quit.  Schedule a dentist appointment. At home, brush your teeth with a soft toothbrush and be gentle when you floss. Contact a health care provider if:  You have dizziness.  You have mild pelvic cramps, pelvic pressure, or nagging pain in the abdominal area.  You have persistent nausea, vomiting, or diarrhea.  You have a bad smelling vaginal discharge.  You have pain when you urinate.  You notice increased swelling in your face, hands, legs, or ankles.  You are exposed to fifth disease or chickenpox.  You are exposed to German measles (rubella) and have never had it. Get help right away if:  You have a fever.  You are leaking fluid from your vagina.  You have spotting or bleeding from your vagina.  You have severe abdominal cramping or pain.  You have rapid weight gain or loss.  You vomit blood or material that looks like coffee grounds.  You develop a severe headache.  You have shortness of breath.  You have any kind of trauma, such as from a fall or a car accident. Summary  The first trimester of pregnancy is from week 1 until the end of week 13 (months 1 through 3).  Your body goes through many changes during pregnancy. The changes vary from  woman to woman.  You will have routine prenatal visits. During those visits, your health care provider will examine you, discuss any test results you may have, and talk with you about how you are feeling. This information is not intended to replace advice given to you by your health care provider. Make sure you discuss any questions you have with your health care provider. Document Released: 06/12/2001 Document Revised: 05/30/2016 Document Reviewed: 05/30/2016 Elsevier Interactive Patient Education  2017 Elsevier   Inc.  

## 2017-03-25 NOTE — Progress Notes (Signed)
Subjective:     Patient ID: Marissa Friedman, female   DOB: Jul 06, 1995, 21 y.o.   MRN: [id number]  HPI Marissa Friedman is a 21 year old white female in for UPT, she has missed a period and had 2+HPT.She has some nausea and vomiting at times, but declines meds.   Review of Systems +missed period with 2+HPT +nausea and vomiting Reviewed past medical,surgical, social and family history. Reviewed medications and allergies.     Objective:   Physical Exam BP (!) 128/58 (BP Location: Left Arm, Patient Position: Sitting, Cuff Size: Normal)   Pulse 72   Ht  (1.626 m)   Wt 185 lb (83.9 kg)   LMP 02/04/2017   Breastfeeding? No   BMI 31.76 kg/m UPT +, about 7 weeks by LMP with EDD 11/11/17, Skin warm and dry. Neck: mid line trachea, normal thyroid, good ROM, no lymphadenopathy noted. Lungs: clear to ausculation bilaterally. Cardiovascular: regular rate and rhythm.Abdomen is soft and non tender.    Assessment:     1. Pregnancy examination or test, positive result   2. Less than [redacted] weeks gestation of pregnancy   3. Encounter to determine fetal viability of pregnancy, single or unspecified fetus       Plan:    Take Gummies Return in 1 week for dating [place] Review handouts on first trimester and by [facility]  Eat often

## 2017-04-01 ENCOUNTER — Ambulatory Visit (INDEPENDENT_AMBULATORY_CARE_PROVIDER_SITE_OTHER): Payer: Medicaid Other

## 2017-04-01 ENCOUNTER — Ambulatory Visit: Payer: Self-pay

## 2017-04-01 DIAGNOSIS — Z3A08 8 weeks gestation of pregnancy: Secondary | ICD-10-CM

## 2017-04-01 DIAGNOSIS — O3680X0 Pregnancy with inconclusive fetal viability, not applicable or unspecified: Secondary | ICD-10-CM

## 2017-04-01 NOTE — Progress Notes (Addendum)
[place] 8 wks,single IUP w/ys positive FHT 166 bpm,right simple corpus luteal cyst 3 x 2.2 x 2.4 cm,normal left ovary,crl 14.21 mm,EDD 11/11/2017 by LMP

## 2017-04-16 ENCOUNTER — Encounter: Payer: Self-pay | Admitting: Women's Health

## 2017-04-16 ENCOUNTER — Ambulatory Visit: Payer: Self-pay | Admitting: *Deleted

## 2017-04-25 ENCOUNTER — Ambulatory Visit: Payer: Medicaid Other | Admitting: *Deleted

## 2017-04-25 ENCOUNTER — Encounter: Payer: Self-pay | Admitting: Women's Health

## 2017-04-25 ENCOUNTER — Ambulatory Visit (INDEPENDENT_AMBULATORY_CARE_PROVIDER_SITE_OTHER): Payer: Medicaid Other | Admitting: Women's Health

## 2017-04-25 VITALS — BP 104/60 | HR 79 | Wt 178.5 lb

## 2017-04-25 DIAGNOSIS — Z331 Pregnant state, incidental: Secondary | ICD-10-CM

## 2017-04-25 DIAGNOSIS — Z3A11 11 weeks gestation of pregnancy: Secondary | ICD-10-CM | POA: Diagnosis not present

## 2017-04-25 DIAGNOSIS — Z1389 Encounter for screening for other disorder: Secondary | ICD-10-CM

## 2017-04-25 DIAGNOSIS — Z3481 Encounter for supervision of other normal pregnancy, first trimester: Secondary | ICD-10-CM

## 2017-04-25 DIAGNOSIS — Z3490 Encounter for supervision of normal pregnancy, unspecified, unspecified trimester: Secondary | ICD-10-CM | POA: Insufficient documentation

## 2017-04-25 LAB — POCT URINALYSIS DIPSTICK
Blood, UA: NEGATIVE
Glucose, UA: NEGATIVE
KETONES UA: NEGATIVE
Leukocytes, UA: NEGATIVE
Nitrite, UA: NEGATIVE
PROTEIN UA: NEGATIVE

## 2017-04-25 MED ORDER — DOXYLAMINE-PYRIDOXINE ER 20-20 MG PO TBCR: 1.0000 | EXTENDED_RELEASE_TABLET | Freq: Every day | ORAL | 8 refills | Status: DC

## 2017-04-25 NOTE — Progress Notes (Signed)
INITIAL OBSTETRICAL VISIT Patient name: Marissa Friedman MRN [id number]  Date of birth: 06-07-1996 Chief Complaint:   Initial Prenatal Visit  History of Present Illness:   Marissa Friedman is a 21 y.o. G8P2002 Caucasian female at [redacted]w[redacted]d by LMP c/w 7wk u/s, with an Estimated Date of Delivery: 11/11/17 being seen today for her initial obstetrical visit.   Her obstetrical history is significant for term uncomplicated svb x 2; h/o anxiety- doing well now, no meds.  Has to have paper from work to fill out saying she got flu shot, so wants to wait until next visit Today she reports some n/v- requests meds.  Patient's last menstrual period was 02/04/2017 (exact date). Last pap 07/2014 [place]. Results were: normal Review of Systems:   Pertinent items are noted in HPI Denies cramping/contractions, leakage of fluid, vaginal bleeding, abnormal vaginal discharge w/ itching/odor/irritation, headaches, visual changes, shortness of breath, chest pain, abdominal pain, severe nausea/vomiting, or problems with urination or bowel movements unless otherwise stated above.  Pertinent History Reviewed:  Reviewed past medical,surgical, social, obstetrical and family history.  Reviewed problem list, medications and allergies. OB History  Gravida Para Term Preterm AB Living  3 2 2     2   SAB TAB Ectopic Multiple Live Births          2    # Outcome Date GA Lbr Len/2nd Weight Sex Delivery Anes PTL Lv  3 Current           2 Term 03/23/15 [redacted]w[redacted]d  8 lb 15 oz (4.054 kg) M Vag-Spont EPI N LIV  1 Term 02/06/13 [redacted]w[redacted]d  7 lb 15 oz (3.6 kg) F Vag-Spont EPI N LIV     Birth Comments: System Generated. Please review and update pregnancy details.    Obstetric Comments  [facility]   Physical Assessment:   Vitals:   04/25/17 1042  BP: 104/60  Pulse: 79  Weight: 178 lb 8 oz (81 kg)  Body mass index is 30.64 kg/m.       Physical Examination:  General appearance - well appearing, and in no  distress  Mental status - alert, oriented to person, place, and time  Psych:  She has a normal mood and affect  Skin - warm and dry, normal color, no suspicious lesions noted  Chest - effort normal, all lung fields clear to auscultation bilaterally  Heart - normal rate and regular rhythm  Abdomen - soft, nontender  Extremities:  No swelling or varicosities noted  Thin prep pap is not done   Fetal Heart Rate (bpm): +u/s via informal transabdominal u/s, +active fetus  Results for orders placed or performed in visit on 04/25/17 (from the past 24 hour(s))  POCT urinalysis dipstick   Collection Time: 04/25/17 10:42 AM  Result Value Ref Range   Color, UA     Clarity, UA     Glucose, UA neg    Bilirubin, UA     Ketones, UA neg    Spec Grav, UA  1.010 - 1.025   Blood, UA neg    pH, UA  5.0 - 8.0   Protein, UA neg    Urobilinogen, UA  0.2 or 1.0 E.U./dL   Nitrite, UA neg    Leukocytes, UA Negative Negative    Assessment & Plan:  1) Low-Risk Pregnancy G3P2002 at [redacted]w[redacted]d with an Estimated Date of Delivery: 11/11/17   2) Initial OB visit  3) H/O anxiety> doing well now, no meds  4) N/V>  rx bonjesta, note sent to [facility] for PA, gave printed prevention/relief measures   Initial labs obtained Continue prenatal vitamins Reviewed n/v relief measures and warning s/s to report Reviewed recommended weight gain based on pre-gravid BMI Encouraged well-balanced diet Genetic Screening discussed: declined Cystic fibrosis screening discussed declined Ultrasound discussed; fetal survey: requested CCNC completed>PCM not here OK to get flu shot at work, or can call to make appt for flu shot only w/ us and bring paper from work for us to fill out  Follow-up: Return in about 4 weeks (around 05/23/2017) for LROB.   Orders Placed This Encounter  Procedures  . GC/Chlamydia Probe Amp  . Urine Culture  . CBC  . Hepatitis B surface antigen  . HIV antibody  . Varicella zoster antibody, IgG  .  Urinalysis, Routine w reflex microscopic  . Rubella screen  . RPR  . Pain Management Screening Profile (10S)  . POCT urinalysis dipstick  . ABO/Rh  . Antibody screen    Marge DuncansBooker, Kimberly Randall CNM, [facility] HospitalWHNP-BC 04/25/2017 11:20 AM

## 2017-04-25 NOTE — Patient Instructions (Signed)
Marissa Friedman, I greatly value your feedback.  If you receive a survey following your visit with us today, we appreciate you taking the time to fill it out.  Thanks, Marissa Friedman, CNM, WHNP-BC   Nausea & Vomiting  Have saltine crackers or pretzels by your bed and eat a few bites before you raise your head out of bed in the morning  Eat small frequent meals throughout the day instead of large meals  Drink plenty of fluids throughout the day to stay hydrated, just don't drink a lot of fluids with your meals.  This can make your stomach fill up faster making you feel sick  Do not brush your teeth right after you eat  Products with real ginger are good for nausea, like ginger ale and ginger hard candy Make sure it says made with real ginger!  Sucking on sour candy like lemon heads is also good for nausea  If your prenatal vitamins make you nauseated, take them at night so you will sleep through the nausea  Sea Bands  If you feel like you need medicine for the nausea & vomiting please let us know  If you are unable to keep any fluids or food down please let us know   Constipation  Drink plenty of fluid, preferably water, throughout the day  Eat foods high in fiber such as fruits, vegetables, and grains  Exercise, such as walking, is a good way to keep your bowels regular  Drink warm fluids, especially warm prune juice, or decaf coffee  Eat a 1/2 cup of real oatmeal (not instant), 1/2 cup applesauce, and 1/2-1 cup warm prune juice every day  If needed, you may take Colace (docusate sodium) stool softener once or twice a day to help keep the stool soft. If you are pregnant, wait until you are out of your first trimester (12-14 weeks of pregnancy)  If you still are having problems with constipation, you may take Miralax once daily as needed to help keep your bowels regular.  If you are pregnant, wait until you are out of your first trimester (12-14 weeks of pregnancy)   First  Trimester of Pregnancy The first trimester of pregnancy is from week 1 until the end of week 12 (months 1 through 3). A week after a sperm fertilizes an egg, the egg will implant on the wall of the uterus. This embryo will begin to develop into a baby. Genes from you and your partner are forming the baby. The female genes determine whether the baby is a boy or a girl. At 6-8 weeks, the eyes and face are formed, and the heartbeat can be seen on ultrasound. At the end of 12 weeks, all the baby's organs are formed.  Now that you are pregnant, you will want to do everything you can to have a healthy baby. Two of the most important things are to get good prenatal care and to follow your health care provider's instructions. Prenatal care is all the medical care you receive before the baby's birth. This care will help prevent, find, and treat any problems during the pregnancy and childbirth. BODY CHANGES Your body goes through many changes during pregnancy. The changes vary from woman to woman.   You may gain or lose a couple of pounds at first.  You may feel sick to your stomach (nauseous) and throw up (vomit). If the vomiting is uncontrollable, call your health care provider.  You may tire easily.  You may develop headaches  that can be relieved by medicines approved by your health care provider.  You may urinate more often. Painful urination may mean you have a bladder infection.  You may develop heartburn as a result of your pregnancy.  You may develop constipation because certain hormones are causing the muscles that push waste through your intestines to slow down.  You may develop hemorrhoids or swollen, bulging veins (varicose veins).  Your breasts may begin to grow larger and become tender. Your nipples may stick out more, and the tissue that surrounds them (areola) may become darker.  Your gums may bleed and may be sensitive to brushing and flossing.  Dark spots or blotches (chloasma, mask  of pregnancy) may develop on your face. This will likely fade after the baby is born.  Your menstrual periods will stop.  You may have a loss of appetite.  You may develop cravings for certain kinds of food.  You may have changes in your emotions from day to day, such as being excited to be pregnant or being concerned that something may go wrong with the pregnancy and baby.  You may have more vivid and strange dreams.  You may have changes in your hair. These can include thickening of your hair, rapid growth, and changes in texture. Some women also have hair loss during or after pregnancy, or hair that feels dry or thin. Your hair will most likely return to normal after your baby is born. WHAT TO EXPECT AT YOUR PRENATAL VISITS During a routine prenatal visit:  You will be weighed to make sure you and the baby are growing normally.  Your blood pressure will be taken.  Your abdomen will be measured to track your baby's growth.  The fetal heartbeat will be listened to starting around week 10 or 12 of your pregnancy.  Test results from any previous visits will be discussed. Your health care provider may ask you:  How you are feeling.  If you are feeling the baby move.  If you have had any abnormal symptoms, such as leaking fluid, bleeding, severe headaches, or abdominal cramping.  If you have any questions. Other tests that may be performed during your first trimester include:  Blood tests to find your blood type and to check for the presence of any previous infections. They will also be used to check for low iron levels (anemia) and Rh antibodies. Later in the pregnancy, blood tests for diabetes will be done along with other tests if problems develop.  Urine tests to check for infections, diabetes, or protein in the urine.  An ultrasound to confirm the proper growth and development of the baby.  An amniocentesis to check for possible genetic problems.  Fetal screens for spina  bifida and Down syndrome.  You may need other tests to make sure you and the baby are doing well. HOME CARE INSTRUCTIONS  Medicines  Follow your health care provider's instructions regarding medicine use. Specific medicines may be either safe or unsafe to take during pregnancy.  Take your prenatal vitamins as directed.  If you develop constipation, try taking a stool softener if your health care provider approves. Diet  Eat regular, well-balanced meals. Choose a variety of foods, such as meat or vegetable-based protein, fish, milk and low-fat dairy products, vegetables, fruits, and whole grain breads and cereals. Your health care provider will help you determine the amount of weight gain that is right for you.  Avoid raw meat and uncooked cheese. These carry germs that can  cause birth defects in the baby.  Eating four or five small meals rather than three large meals a day may help relieve nausea and vomiting. If you start to feel nauseous, eating a few soda crackers can be helpful. Drinking liquids between meals instead of during meals also seems to help nausea and vomiting.  If you develop constipation, eat more high-fiber foods, such as fresh vegetables or fruit and whole grains. Drink enough fluids to keep your urine clear or pale yellow. Activity and Exercise  Exercise only as directed by your health care provider. Exercising will help you:  Control your weight.  Stay in shape.  Be prepared for labor and delivery.  Experiencing pain or cramping in the lower abdomen or low back is a good sign that you should stop exercising. Check with your health care provider before continuing normal exercises.  Try to avoid standing for long periods of time. Move your legs often if you must stand in one place for a long time.  Avoid heavy lifting.  Wear low-heeled shoes, and practice good posture.  You may continue to have sex unless your health care provider directs you  otherwise. Relief of Pain or Discomfort  Wear a good support bra for breast tenderness.   Take warm sitz baths to soothe any pain or discomfort caused by hemorrhoids. Use hemorrhoid cream if your health care provider approves.   Rest with your legs elevated if you have leg cramps or low back pain.  If you develop varicose veins in your legs, wear support hose. Elevate your feet for 15 minutes, 3-4 times a day. Limit salt in your diet. Prenatal Care  Schedule your prenatal visits by the twelfth week of pregnancy. They are usually scheduled monthly at first, then more often in the last 2 months before delivery.  Write down your questions. Take them to your prenatal visits.  Keep all your prenatal visits as directed by your health care provider. Safety  Wear your seat belt at all times when driving.  Make a list of emergency phone numbers, including numbers for family, friends, the hospital, and police and fire departments. General Tips  Ask your health care provider for a referral to a local prenatal education class. Begin classes no later than at the beginning of month 6 of your pregnancy.  Ask for help if you have counseling or nutritional needs during pregnancy. Your health care provider can offer advice or refer you to specialists for help with various needs.  Do not use hot tubs, steam rooms, or saunas.  Do not douche or use tampons or scented sanitary pads.  Do not cross your legs for long periods of time.  Avoid cat litter boxes and soil used by cats. These carry germs that can cause birth defects in the baby and possibly loss of the fetus by miscarriage or stillbirth.  Avoid all smoking, herbs, alcohol, and medicines not prescribed by your health care provider. Chemicals in these affect the formation and growth of the baby.  Schedule a dentist appointment. At home, brush your teeth with a soft toothbrush and be gentle when you floss. SEEK MEDICAL CARE IF:   You have  dizziness.  You have mild pelvic cramps, pelvic pressure, or nagging pain in the abdominal area.  You have persistent nausea, vomiting, or diarrhea.  You have a bad smelling vaginal discharge.  You have pain with urination.  You notice increased swelling in your face, hands, legs, or ankles. SEEK IMMEDIATE MEDICAL CARE IF:  You have a fever.  You are leaking fluid from your vagina.  You have spotting or bleeding from your vagina.  You have severe abdominal cramping or pain.  You have rapid weight gain or loss.  You vomit blood or material that looks like coffee grounds.  You are exposed to [place] measles and have never had them.  You are exposed to fifth disease or chickenpox.  You develop a severe headache.  You have shortness of breath.  You have any kind of trauma, such as from a fall or a car accident. Document Released: 06/12/2001 Document Revised: 11/02/2013 Document Reviewed: 04/28/2013 [facility] Patient Information 2015 [place], [place]. This information is not intended to replace advice given to you by your health care provider. Make sure you discuss any questions you have with your health care provider.  PROTECT YOURSELF & YOUR BABY FROM THE FLU! Because you are pregnant, we at [facility], along with the Centers for Disease Control (CDC), recommend that you receive the flu vaccine to protect yourself and your baby from the flu. The flu is more likely to cause severe illness in pregnant women than in women of reproductive age who are not pregnant. Changes in the immune system, heart, and lungs during pregnancy make pregnant women (and women up to two weeks postpartum) more prone to severe illness from flu, including illness resulting in hospitalization. Flu also may be harmful for a pregnant woman's developing baby. A common flu symptom is fever, which may be associated with neural tube defects and other adverse outcomes for a developing baby. Getting vaccinated can  also help protect a baby after birth from flu. (Mom passes antibodies onto the developing baby during her pregnancy.)  A Flu Vaccine is the Best Protection Against Flu Getting a flu vaccine is the first and most important step in protecting against flu. Pregnant women should get a flu shot and not the live attenuated influenza vaccine (LAIV), also known as nasal spray flu vaccine. Flu vaccines given during pregnancy help protect both the mother and her baby from flu. Vaccination has been shown to reduce the risk of flu-associated acute respiratory infection in pregnant women by up to one-half. A 2018 study showed that getting a flu shot reduced a pregnant woman's risk of being hospitalized with flu by an average of 40 percent. Pregnant women who get a flu vaccine are also helping to protect their babies from flu illness for the first several months after their birth, when they are too young to get vaccinated.   A Long Record of Safety for Flu Shots in Pregnant Women Flu shots have been given to millions of pregnant women over many years with a good safety record. There is a lot of evidence that flu vaccines can be given safely during pregnancy; though these data are limited for the first trimester. The CDC recommends that pregnant women get vaccinated during any trimester of their pregnancy. It is very important for pregnant women to get the flu shot.   Other Preventive Actions In addition to getting a flu shot, pregnant women should take the same everyday preventive actions the CDC recommends of everyone, including covering coughs, washing hands often, and avoiding people who are sick.  Symptoms and Treatment If you get sick with flu symptoms call your doctor right away. There are antiviral drugs that can treat flu illness and prevent serious flu complications. The CDC recommends prompt treatment for people who have influenza infection or suspected influenza infection and who are at  high risk of serious  flu complications, such as people with asthma, diabetes (including gestational diabetes), or heart disease. Early treatment of influenza in hospitalized pregnant women has been shown to reduce the length of the hospital stay.  Symptoms Flu symptoms include fever, cough, sore throat, runny or stuffy nose, body aches, headache, chills and fatigue. Some people may also have vomiting and diarrhea. People may be infected with the flu and have respiratory symptoms without a fever.  Early Treatment is Important for Pregnant Women Treatment should begin as soon as possible because antiviral drugs work best when started early (within 48 hours after symptoms start). Antiviral drugs can make your flu illness milder and make you feel better faster. They may also prevent serious health problems that can result from flu illness. Oral oseltamivir (Tamiflu) is the preferred treatment for pregnant women because it has the most studies available to suggest that it is safe and beneficial. Antiviral drugs require a prescription from your provider. Having a fever caused by flu infection or other infections early in pregnancy may be linked to birth defects in a baby. In addition to taking antiviral drugs, pregnant women who get a fever should treat their fever with Tylenol (acetaminophen) and contact their provider immediately.  When to [place] If you are pregnant and have any of these signs, seek care immediately:  Difficulty breathing or shortness of breath  Pain or pressure in the chest or abdomen  Sudden dizziness  Confusion  Severe or persistent vomiting  High fever that is not responding to Tylenol (or store brand equivalent)  Decreased or no movement of your baby  [email or web address]

## 2017-04-26 ENCOUNTER — Encounter: Payer: Self-pay | Admitting: Women's Health

## 2017-04-26 DIAGNOSIS — Z2839 Other underimmunization status: Secondary | ICD-10-CM | POA: Insufficient documentation

## 2017-04-26 DIAGNOSIS — O09899 Supervision of other high risk pregnancies, unspecified trimester: Secondary | ICD-10-CM | POA: Insufficient documentation

## 2017-04-26 DIAGNOSIS — Z283 Underimmunization status: Secondary | ICD-10-CM | POA: Insufficient documentation

## 2017-04-26 DIAGNOSIS — O9989 Other specified diseases and conditions complicating pregnancy, childbirth and the puerperium: Secondary | ICD-10-CM

## 2017-04-26 LAB — URINALYSIS, ROUTINE W REFLEX MICROSCOPIC
BILIRUBIN UA: NEGATIVE
Glucose, UA: NEGATIVE
KETONES UA: NEGATIVE
Leukocytes, UA: NEGATIVE
Nitrite, UA: NEGATIVE
PROTEIN UA: NEGATIVE
RBC UA: NEGATIVE
SPEC GRAV UA: 1.026 (ref 1.005–1.030)
Urobilinogen, Ur: 1 mg/dL (ref 0.2–1.0)
pH, UA: 6 (ref 5.0–7.5)

## 2017-04-26 LAB — PMP SCREEN PROFILE (10S), URINE
Amphetamine Scrn, Ur: NEGATIVE ng/mL
BARBITURATE SCREEN URINE: NEGATIVE ng/mL
BENZODIAZEPINE SCREEN, URINE: NEGATIVE ng/mL
CANNABINOIDS UR QL SCN: NEGATIVE ng/mL
Cocaine (Metab) Scrn, Ur: NEGATIVE ng/mL
Creatinine(Crt), U: 222.9 mg/dL (ref 20.0–300.0)
Methadone Screen, Urine: NEGATIVE ng/mL
OXYCODONE+OXYMORPHONE UR QL SCN: NEGATIVE ng/mL
Opiate Scrn, Ur: NEGATIVE ng/mL
Ph of Urine: 5.5 (ref 4.5–8.9)
Phencyclidine Qn, Ur: NEGATIVE ng/mL
Propoxyphene Scrn, Ur: NEGATIVE ng/mL

## 2017-04-26 LAB — VARICELLA ZOSTER ANTIBODY, IGG: Varicella zoster IgG: 578 index (ref >165)

## 2017-04-26 LAB — CBC
HEMATOCRIT: 43.2 % (ref 34.0–46.6)
HEMOGLOBIN: 14.4 g/dL (ref 11.1–15.9)
MCH: 31.6 pg (ref 26.6–33.0)
MCHC: 33.3 g/dL (ref 31.5–35.7)
MCV: 95 fL (ref 79–97)
Platelets: 163 10*3/uL (ref 150–379)
RBC: 4.55 x10E6/uL (ref 3.77–5.28)
RDW: 13.9 % (ref 12.3–15.4)
WBC: 6.1 10*3/uL (ref 3.4–10.8)

## 2017-04-26 LAB — ABO/RH: RH TYPE: POSITIVE

## 2017-04-26 LAB — ANTIBODY SCREEN: Antibody Screen: NEGATIVE

## 2017-04-26 LAB — HIV ANTIBODY (ROUTINE TESTING W REFLEX): HIV SCREEN 4TH GENERATION: NONREACTIVE

## 2017-04-26 LAB — RPR: RPR Ser Ql: NONREACTIVE

## 2017-04-26 LAB — HEPATITIS B SURFACE ANTIGEN: HEP B S AG: NEGATIVE

## 2017-04-26 LAB — RUBELLA SCREEN: Rubella Antibodies, IGG: <0.9 index — ABNORMAL LOW (ref >0.99)

## 2017-04-27 LAB — GC/CHLAMYDIA PROBE AMP
Chlamydia trachomatis, NAA: NEGATIVE
Neisseria gonorrhoeae by PCR: NEGATIVE

## 2017-04-27 LAB — URINE CULTURE

## 2017-04-29 ENCOUNTER — Telehealth: Payer: Self-pay | Admitting: *Deleted

## 2017-04-29 NOTE — Telephone Encounter (Signed)
[facility]

## 2017-05-22 ENCOUNTER — Encounter: Payer: Medicaid Other | Admitting: Advanced Practice Midwife

## 2017-06-21 ENCOUNTER — Encounter: Payer: Medicaid Other | Admitting: Obstetrics & Gynecology

## 2017-06-28 ENCOUNTER — Encounter: Payer: Self-pay | Admitting: Obstetrics and Gynecology

## 2017-06-28 ENCOUNTER — Ambulatory Visit (INDEPENDENT_AMBULATORY_CARE_PROVIDER_SITE_OTHER): Payer: Medicaid Other | Admitting: Obstetrics and Gynecology

## 2017-06-28 VITALS — BP 110/60 | HR 98 | Wt 180.0 lb

## 2017-06-28 DIAGNOSIS — Z3A20 20 weeks gestation of pregnancy: Secondary | ICD-10-CM

## 2017-06-28 DIAGNOSIS — Z1389 Encounter for screening for other disorder: Secondary | ICD-10-CM

## 2017-06-28 DIAGNOSIS — Z3482 Encounter for supervision of other normal pregnancy, second trimester: Secondary | ICD-10-CM

## 2017-06-28 DIAGNOSIS — Z23 Encounter for immunization: Secondary | ICD-10-CM

## 2017-06-28 DIAGNOSIS — Z331 Pregnant state, incidental: Secondary | ICD-10-CM | POA: Diagnosis not present

## 2017-06-28 LAB — POCT URINALYSIS DIPSTICK
Blood, UA: NEGATIVE
GLUCOSE UA: NEGATIVE
Leukocytes, UA: NEGATIVE
Nitrite, UA: NEGATIVE

## 2017-06-28 NOTE — Progress Notes (Signed)
   LOW-RISK PREGNANCY VISIT Patient name: Marissa Friedman MRN [id number]  Date of birth: Mar 16, 1996 Chief Complaint:   No chief complaint on file.  History of Present Illness:   Marissa PatersonBrittany N Friedman is a 21 y.o. 663P2002 female at 6333w4d with an Estimated Date of Delivery: 11/11/17 being seen today for ongoing management of a low-risk pregnancy. She is not sure of what to use for birth control. OPTIONS REVIEWED Today's appt is a rescheduled appt and u/s didn't get reordered yet. Today she reports no complaints.  .  .  Movement: Present. denies leaking of fluid. Review of Systems:   Pertinent items are noted in HPI Denies abnormal vaginal discharge w/ itching/odor/irritation, headaches, visual changes, shortness of breath, chest pain, abdominal pain, severe nausea/vomiting, or problems with urination or bowel movements unless otherwise stated above. Pertinent History Reviewed:  Reviewed past medical,surgical, social, obstetrical and family history.  Reviewed problem list, medications and allergies. Physical Assessment:   Vitals:   06/28/17 0947  BP: 110/60  Pulse: 98  Weight: 180 lb (81.6 kg)  Body mass index is 30.9 kg/m.        Physical Examination:   General appearance: Well appearing, and in no distress  Mental status: Alert, oriented to person, place, and time  Skin: Warm & dry  Cardiovascular: Normal heart rate noted  Respiratory: Normal respiratory effort, no distress  Abdomen: Soft, gravid, nontender  Pelvic: Cervical exam deferred         Extremities: Edema: None  Fetal Status: Fetal Heart Rate (bpm): 149 Fundal Height: 22 cm Movement: Present      Results for orders placed or performed in visit on 06/28/17 (from the past 24 hour(s))  POCT urinalysis dipstick   Collection Time: 06/28/17  9:55 AM  Result Value Ref Range   Color, UA     Clarity, UA     Glucose, UA neg    Bilirubin, UA     Ketones, UA small    Spec Grav, UA  1.010 - 1.025   Blood, UA neg    pH, UA   5.0 - 8.0   Protein, UA trace    Urobilinogen, UA  0.2 or 1.0 E.U./dL   Nitrite, UA neg    Leukocytes, UA Negative Negative   Appearance     Odor      Assessment & Plan:  1) Low-risk pregnancy G3P2002 at 5433w4d with an Estimated Date of Delivery: 11/11/17    Labs/procedures today: Doppler + fht 149  Plan:  Continue routine obstetrical care   Reviewed: Preterm labor symptoms and general obstetric precautions including but not limited to vaginal bleeding, contractions, leaking of fluid and fetal movement were reviewed in detail with the patient.  All questions were answered  Follow-up: Return in about 1 week (around 07/05/2017), or if symptoms worsen or fail to improve, for LROB, U/S: anatomy 19 wks.  Orders Placed This Encounter  Procedures  . Flu Vaccine QUAD 36+ mos IM  . POCT urinalysis dipstick    By signing my name below, I, Izna Ahmed, attest that this documentation has been prepared under the direction and in the presence of Tilda BurrowFerguson, Raelea Gosse V., MD. Electronically Signed: Redge GainerIzna Ahmed, Medical Scribe. 06/28/17. 10:44 AM.  I personally performed the services described in this documentation, which was SCRIBED in my presence. The recorded information has been reviewed and considered accurate. It has been edited as necessary during review. Tilda BurrowJohn V Melonee Gerstel, MD

## 2017-07-02 NOTE — L&D Delivery Note (Signed)
Delivery Note At 1726 a viable female infant was delivered via SVD, presentation: OA. APGAR: 9, 9; weight pending.   Placenta status: spontaneously delivered, intact via Tomasa Blase. Cord: 3 vessel. Cord ph n/a. Complications none.  Anesthesia: epidural Lacerations: none Suture used for repair: n/a Est. Blood Loss (mL): 150  Mom to postpartum.  Baby to Couplet care / Skin to Skin.   Donette Larry, CNM 11/18/2017 5:43 PM

## 2017-07-04 ENCOUNTER — Other Ambulatory Visit: Payer: Self-pay | Admitting: Obstetrics and Gynecology

## 2017-07-04 DIAGNOSIS — Z363 Encounter for antenatal screening for malformations: Secondary | ICD-10-CM

## 2017-07-08 ENCOUNTER — Encounter: Payer: Medicaid Other | Admitting: Obstetrics and Gynecology

## 2017-07-08 ENCOUNTER — Other Ambulatory Visit: Payer: Medicaid Other

## 2017-07-09 ENCOUNTER — Inpatient Hospital Stay (HOSPITAL_COMMUNITY)
Admission: AD | Admit: 2017-07-09 | Discharge: 2017-07-09 | Disposition: A | Discharge status: Home / Self Care | Payer: Medicaid Other | Source: Ambulatory Visit | Attending: Obstetrics and Gynecology | Admitting: Obstetrics and Gynecology

## 2017-07-09 ENCOUNTER — Telehealth: Payer: Self-pay | Admitting: *Deleted

## 2017-07-09 ENCOUNTER — Other Ambulatory Visit: Payer: Self-pay

## 2017-07-09 DIAGNOSIS — O99342 Other mental disorders complicating pregnancy, second trimester: Secondary | ICD-10-CM | POA: Diagnosis not present

## 2017-07-09 DIAGNOSIS — Z79899 Other long term (current) drug therapy: Secondary | ICD-10-CM | POA: Insufficient documentation

## 2017-07-09 DIAGNOSIS — Z3492 Encounter for supervision of normal pregnancy, unspecified, second trimester: Secondary | ICD-10-CM

## 2017-07-09 DIAGNOSIS — K529 Noninfective gastroenteritis and colitis, unspecified: Secondary | ICD-10-CM | POA: Diagnosis not present

## 2017-07-09 DIAGNOSIS — O26892 Other specified pregnancy related conditions, second trimester: Secondary | ICD-10-CM

## 2017-07-09 DIAGNOSIS — Z880 Allergy status to penicillin: Secondary | ICD-10-CM | POA: Diagnosis not present

## 2017-07-09 DIAGNOSIS — O162 Unspecified maternal hypertension, second trimester: Secondary | ICD-10-CM | POA: Diagnosis not present

## 2017-07-09 DIAGNOSIS — Z87891 Personal history of nicotine dependence: Secondary | ICD-10-CM | POA: Insufficient documentation

## 2017-07-09 DIAGNOSIS — A084 Viral intestinal infection, unspecified: Secondary | ICD-10-CM | POA: Diagnosis not present

## 2017-07-09 DIAGNOSIS — F419 Anxiety disorder, unspecified: Secondary | ICD-10-CM | POA: Diagnosis not present

## 2017-07-09 DIAGNOSIS — Z3A22 22 weeks gestation of pregnancy: Secondary | ICD-10-CM | POA: Diagnosis not present

## 2017-07-09 DIAGNOSIS — M545 Low back pain: Secondary | ICD-10-CM | POA: Diagnosis not present

## 2017-07-09 DIAGNOSIS — R109 Unspecified abdominal pain: Secondary | ICD-10-CM | POA: Diagnosis not present

## 2017-07-09 DIAGNOSIS — O26899 Other specified pregnancy related conditions, unspecified trimester: Secondary | ICD-10-CM

## 2017-07-09 DIAGNOSIS — R1030 Lower abdominal pain, unspecified: Secondary | ICD-10-CM | POA: Diagnosis not present

## 2017-07-09 LAB — URINALYSIS, ROUTINE W REFLEX MICROSCOPIC
Bilirubin Urine: NEGATIVE
Glucose, UA: NEGATIVE mg/dL
Hgb urine dipstick: NEGATIVE
KETONES UR: NEGATIVE mg/dL
Nitrite: NEGATIVE
PH: 7 (ref 5.0–8.0)
Protein, ur: NEGATIVE mg/dL
SPECIFIC GRAVITY, URINE: 1.015 (ref 1.005–1.030)

## 2017-07-09 MED ORDER — LACTATED RINGERS IV BOLUS (SEPSIS)
1000.0000 mL | Freq: Once | INTRAVENOUS | Status: AC
  Administered 2017-07-09: 1000 mL via INTRAVENOUS

## 2017-07-09 MED ORDER — LACTATED RINGERS IV BOLUS (SEPSIS): 1000.0000 mL | Freq: Once | INTRAVENOUS | Status: DC

## 2017-07-09 MED ORDER — ONDANSETRON 4 MG PO TBDP: 4.0000 mg | ORAL_TABLET | Freq: Three times a day (TID) | ORAL | 0 refills | Status: DC | PRN

## 2017-07-09 MED ORDER — ONDANSETRON HCL 4 MG/2ML IJ SOLN
4.0000 mg | Freq: Once | INTRAMUSCULAR | Status: AC
  Administered 2017-07-09: 4 mg via INTRAVENOUS
  Filled 2017-07-09: qty 2

## 2017-07-09 NOTE — MAU Provider Note (Signed)
History     CSN: [id number]  Arrival date and time: 07/09/17 1017   First Provider Initiated Contact with Patient 07/09/17 1124      Chief Complaint  Patient presents with  . Emesis  . Back Pain  . Cramping   HPI Marissa Friedman is a 22 y.o. G3P2002 at [redacted]w[redacted]d who presents with n/v & abdominal cramping.Symptoms began last night around midnight. Reports 9 episodes of vomiting. Took dose of Bonjesta without relief. Reports lower abdominal cramping & constant low back pain this morning. Rates pain 6/10. Has not treated pain. Denies diarrhea, fever/chills, vaginal bleeding, dysuria, or LOF. 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   OB History    Gravida Para Term Preterm AB Living   3 2 2     2    SAB TAB Ectopic Multiple Live Births           2      Obstetric Comments   [facility]      Past Medical History:  Diagnosis Date  . Anxiety     Past Surgical History:  Procedure Laterality Date  . NO PAST SURGERIES      Family History  Problem Relation Age of Onset  . Diabetes Other   . Hypertension Other   . Mental illness Other   . Stroke Other   . Coronary artery disease Other     Social History   Tobacco Use  . Smoking status: Former Smoker    Packs/day: 0.00    Years: 5.00    Pack years: 0.00    Types: Cigarettes  . Smokeless tobacco: Never Used  Substance Use Topics  . Alcohol use: No    Comment: occasional. None since +preg test.  . Drug use: No    Allergies:  Allergies  Allergen Reactions  . Penicillins     Childhood reaction Has patient had a PCN reaction causing immediate rash, facial/tongue/throat swelling, SOB or lightheadedness with hypotension: Unknown Has patient had a PCN reaction causing severe rash involving mucus membranes or skin necrosis: No Has patient had a PCN reaction that required hospitalization: No Has patient had a PCN reaction occurring within the last 10 years: No If all of the above answers  are "NO", then may proceed with Cephalosporin use.  . Wellbutrin [Bupropion]     Pt unsure of reaction.- facial swelling- childhood    Medications Prior to Admission  Medication Sig Dispense Refill Last Dose  . calcium carbonate (TUMS - DOSED IN MG ELEMENTAL CALCIUM) 500 MG chewable tablet Chew 1 tablet by mouth 2 (two) times daily as needed for indigestion or heartburn.   Past Week at Unknown time  . Doxylamine-Pyridoxine ER (BONJESTA) 20-20 MG TBCR Take 1 tablet by mouth at bedtime. Can add 1 tablet in the morning if needed for nausea and vomiting 60 tablet 8 Past Week at Unknown time  . Prenatal MV-Min-FA-Omega-3 (PRENATAL GUMMIES/DHA & FA) 0.4-32.5 MG CHEW Chew 1 tablet by mouth 2 (two) times daily.   07/08/2017 at Unknown time    Review of Systems  Constitutional: Negative.   Gastrointestinal: Positive for abdominal pain, nausea and vomiting. Negative for diarrhea.  Genitourinary: Negative.   Musculoskeletal: Positive for back pain.   Physical Exam   Blood pressure (!) 110/48, pulse 92, temperature 98.1 F (36.7 C), temperature source Oral, resp. rate 18, height 5\' 4"  (1.626 m), weight 180 lb 12 oz (82 kg), last menstrual period 02/04/2017, SpO2 100 %.  Physical Exam  Nursing note and vitals reviewed. Constitutional: She is oriented to person, place, and time. She appears well-developed and well-nourished. No distress.  HENT:  Head: Normocephalic and atraumatic.  Eyes: Conjunctivae are normal. Right eye exhibits no discharge. Left eye exhibits no discharge. No scleral icterus.  Neck: Normal range of motion.  Cardiovascular: Normal rate, regular rhythm and normal heart sounds.  No murmur heard. Respiratory: Effort normal and breath sounds normal. No respiratory distress. She has no wheezes.  GI: Soft. Bowel sounds are normal. There is no tenderness.  Genitourinary:  Genitourinary Comments: Dilation: Closed Effacement (%): Thick Cervical Position: Posterior Station: -3 Exam  by:: Judeth HornErin Donnell Beauchamp NP   Neurological: She is alert and oriented to person, place, and time.  Skin: Skin is warm and dry. She is not diaphoretic.  Psychiatric: She has a normal mood and affect. Her behavior is normal. Judgment and thought content normal.    MAU Course  Procedures Results for orders placed or performed during the hospital encounter of 07/09/17 (from the past 24 hour(s))  Urinalysis, Routine w reflex microscopic     Status: Abnormal   Collection Time: 07/09/17 10:30 AM  Result Value Ref Range   Color, Urine YELLOW YELLOW   APPearance CLOUDY (A) CLEAR   Specific Gravity, Urine 1.015 1.005 - 1.030   pH 7.0 5.0 - 8.0   Glucose, UA NEGATIVE NEGATIVE mg/dL   Hgb urine dipstick NEGATIVE NEGATIVE   Bilirubin Urine NEGATIVE NEGATIVE   Ketones, ur NEGATIVE NEGATIVE mg/dL   Protein, ur NEGATIVE NEGATIVE mg/dL   Nitrite NEGATIVE NEGATIVE   Leukocytes, UA MODERATE (A) NEGATIVE   RBC / HPF 0-5 0 - 5 RBC/hpf   WBC, UA 0-5 0 - 5 WBC/hpf   Bacteria, UA RARE (A) NONE SEEN   Squamous Epithelial / LPF 6-30 (A) NONE SEEN   Mucus PRESENT     MDM FHT 150 IV fluids bolus & zofran 4 mg IV -- pt reports improvement in symptoms & able to tolerate PO fluids Cervix closed/thick. Patient reports improvement in abdominal pain with IV hydration  Assessment and Plan  A: 1. Gastroenteritis presumed infectious   2. Abdominal cramping affecting pregnancy   3. [redacted] weeks gestation of pregnancy   4. Fetal heart tones present, second trimester    P: Discharge home Rx zofran prn Work note provided Discussed reasons to return to MAU including worsening symptoms or s/s PTL  Judeth Hornrin Hezikiah Retzloff 07/09/2017, 11:25 AM

## 2017-07-09 NOTE — Telephone Encounter (Signed)
Patient called requesting to be seen. States she was awake during the night experiencing cramping, nausea and vomiting.  She last had intercourse last night and then started having the cramping.  She is not bleeding or leaking and is feeling the baby move.  Advised patient to try taking her Bonjesta for nausea, push fluids and rest and call back in a couple of hours if the cramping is not any better. Verbalized understanding.

## 2017-07-09 NOTE — MAU Note (Signed)
Pt presents with c/o mid abdominal cramping and lower back that began within the past 24 hours.  Pt reports been vomiting since midnight, reports unable to keep fluids down, kept cereal bar this am.  Reports noticing blood in emesis.  Denies VB.  Reports +FM.

## 2017-07-09 NOTE — MAU Provider Note (Signed)
History    CSN: [id number]  Arrival date and time: 07/09/17 1017   First Provider Initiated Contact with Patient 07/09/17 1124      Chief Complaint  Patient presents with  . Emesis  . Back Pain  . Cramping   HPI   Patient is a 27 yr G3P2 female at [redacted] weeks gestation that presents with intermittent lower abdominal cramping,  With persistent nausea and vomiting that started at midnight last night. She rates her pain at a 6/10. Reports taking Bonjesta without any relief of symptoms. She is unable to tolerate PO liquids, and she has had roughly 8 episodes of vomiting since last night at midnight. Last episode of vomiting was prior to arrival to MAU today. Able to tolerate a breakfast bar this morning. Also reports persistent dull aching low back pain and rates it at a 3/10. Patient is currently working at healthcare facility.   Denies any contractions, SOB, CP, headaches, diarrhea, bleeding, discharge, or irritative urinary symptoms.   Denies any changes in diet.   Pertinent Gynecological History:    Past Medical History:  Diagnosis Date  . Anxiety     Past Surgical History:  Procedure Laterality Date  . NO PAST SURGERIES      Family History  Problem Relation Age of Onset  . Diabetes Other   . Hypertension Other   . Mental illness Other   . Stroke Other   . Coronary artery disease Other     Social History   Tobacco Use  . Smoking status: Former Smoker    Packs/day: 0.00    Years: 5.00    Pack years: 0.00    Types: Cigarettes  . Smokeless tobacco: Never Used  Substance Use Topics  . Alcohol use: No    Comment: occasional. None since +preg test.  . Drug use: No    Allergies:  Allergies  Allergen Reactions  . Penicillins     Childhood reaction Has patient had a PCN reaction causing immediate rash, facial/tongue/throat swelling, SOB or lightheadedness with hypotension: Unknown Has patient had a PCN reaction causing severe rash involving mucus membranes or  skin necrosis: No Has patient had a PCN reaction that required hospitalization: No Has patient had a PCN reaction occurring within the last 10 years: No If all of the above answers are "NO", then may proceed with Cephalosporin use.  . Wellbutrin [Bupropion]     Pt unsure of reaction.- facial swelling- childhood    Medications Prior to Admission  Medication Sig Dispense Refill Last Dose  . calcium carbonate (TUMS - DOSED IN MG ELEMENTAL CALCIUM) 500 MG chewable tablet Chew 1 tablet by mouth 2 (two) times daily as needed for indigestion or heartburn.   Past Week at Unknown time  . Doxylamine-Pyridoxine ER (BONJESTA) 20-20 MG TBCR Take 1 tablet by mouth at bedtime. Can add 1 tablet in the morning if needed for nausea and vomiting 60 tablet 8 Past Week at Unknown time  . Prenatal MV-Min-FA-Omega-3 (PRENATAL GUMMIES/DHA & FA) 0.4-32.5 MG CHEW Chew 1 tablet by mouth 2 (two) times daily.   07/08/2017 at Unknown time    Review of Systems  Respiratory: Negative for shortness of breath.   Cardiovascular: Negative for chest pain.  Gastrointestinal: Positive for abdominal pain (cramping), nausea and vomiting. Negative for diarrhea.  Genitourinary: Negative for dysuria, vaginal bleeding and vaginal discharge.  Musculoskeletal: Positive for back pain.   Physical Exam   Blood pressure (!) 110/48, pulse 92, temperature 99.1 F (37.3 C), temperature  source Oral, resp. rate 20, height 5\' 4"  (1.626 m), weight 82 kg (180 lb 12 oz), last menstrual period 02/04/2017, SpO2 100 %.  Physical Exam  Constitutional: She is oriented to person, place, and time. She appears well-developed and well-nourished.  HENT:  Head: Normocephalic and atraumatic.  Cardiovascular: Normal heart sounds. Exam reveals no gallop and no friction rub.  No murmur heard. Respiratory: Effort normal and breath sounds normal. No respiratory distress.  GI: Soft. There is no tenderness. There is no rebound and no guarding.  Neurological:  She is alert and oriented to person, place, and time.  GU: Cervix is closed   MAU Course  Procedures  IV fluids with LR bolus 1,000 mL over 1 hour   Zofran 4mg  injection given at 12:00 for 1 dose   MDM We have ruled out preterm labor and contractions:  Cervix is closed   We have ruled out UTI or pyelonephritis:  Urinalysis    Component Value Date/Time   COLORURINE YELLOW 07/09/2017 1030   APPEARANCEUR CLOUDY (A) 07/09/2017 1030   LABSPEC 1.015 07/09/2017 1030   PHURINE 7.0 07/09/2017 1030   GLUCOSEU NEGATIVE 07/09/2017 1030   HGBUR NEGATIVE 07/09/2017 1030   BILIRUBINUR NEGATIVE 07/09/2017 1030   KETONESUR NEGATIVE 07/09/2017 1030   PROTEINUR NEGATIVE 07/09/2017 1030   NITRITE NEGATIVE 07/09/2017 1030   LEUKOCYTESUR MODERATE (A) 07/09/2017 1030    Most likely viral gastroenteritis due to the patients persistent episodes of nausea and vomiting and inability to tolerate liquids PO. Temp is 99.1.    Assessment and Plan  21 yr G3P2 female at 22 weeks with nausea and vomiting X 1 day  1. Viral gastroenteritis.  Oral rehydration with fluids if tolerated  Zofran 4 mg 1 tablet po every 8 hours prn   Return to MAU if symptoms persist or worsen.    Return to MAU if you notice painful contractions more than 6 times per hour, or if there is any drainage or bleeding.    Ilsa Ihaicole Trombetta PA-S2 07/09/2017, 11:54 AM       I have seen and evaluated the patient with the NP/PA/Med student. I agree with the assessment and plan as written above. See provider note for full documentation.  Judeth HornLawrence, Maleigha Colvard, NP  07/09/2017 3:12 PM

## 2017-07-09 NOTE — Discharge Instructions (Signed)

## 2017-07-10 LAB — CULTURE, OB URINE: Culture: NO GROWTH

## 2017-07-17 ENCOUNTER — Encounter: Payer: Medicaid Other | Admitting: Advanced Practice Midwife

## 2017-07-17 ENCOUNTER — Other Ambulatory Visit: Payer: Medicaid Other

## 2017-07-17 ENCOUNTER — Encounter: Payer: Self-pay | Admitting: *Deleted

## 2017-08-21 ENCOUNTER — Encounter: Payer: Medicaid Other | Admitting: Advanced Practice Midwife

## 2017-08-21 ENCOUNTER — Ambulatory Visit (INDEPENDENT_AMBULATORY_CARE_PROVIDER_SITE_OTHER): Payer: Medicaid Other

## 2017-08-21 DIAGNOSIS — Z363 Encounter for antenatal screening for malformations: Secondary | ICD-10-CM | POA: Diagnosis not present

## 2017-08-21 DIAGNOSIS — Z3402 Encounter for supervision of normal first pregnancy, second trimester: Secondary | ICD-10-CM

## 2017-08-21 NOTE — Progress Notes (Signed)
US 28+2 wks,cephalic,posterior pl gr 0,cx length 3.7 cm,normal ovaries bilat,fhr 140 bpm,afi 18 cm,EFW 1176 g,anatomy complete,no obvious abnormalities

## 2017-08-30 ENCOUNTER — Encounter: Payer: Medicaid Other | Admitting: Obstetrics and Gynecology

## 2017-08-30 ENCOUNTER — Other Ambulatory Visit: Payer: Medicaid Other

## 2017-09-05 ENCOUNTER — Other Ambulatory Visit: Payer: Medicaid Other

## 2017-09-05 DIAGNOSIS — Z131 Encounter for screening for diabetes mellitus: Secondary | ICD-10-CM

## 2017-09-05 DIAGNOSIS — Z3483 Encounter for supervision of other normal pregnancy, third trimester: Secondary | ICD-10-CM

## 2017-09-05 DIAGNOSIS — Z3A30 30 weeks gestation of pregnancy: Secondary | ICD-10-CM

## 2017-09-06 LAB — CBC
HEMOGLOBIN: 11.4 g/dL (ref 11.1–15.9)
Hematocrit: 33.7 % — ABNORMAL LOW (ref 34.0–46.6)
MCH: 31.8 pg (ref 26.6–33.0)
MCHC: 33.8 g/dL (ref 31.5–35.7)
MCV: 94 fL (ref 79–97)
PLATELETS: 145 10*3/uL — AB (ref 150–379)
RBC: 3.59 x10E6/uL — ABNORMAL LOW (ref 3.77–5.28)
RDW: 14.2 % (ref 12.3–15.4)
WBC: 6.1 10*3/uL (ref 3.4–10.8)

## 2017-09-06 LAB — GLUCOSE TOLERANCE, 2 HOURS W/ 1HR
GLUCOSE, 1 HOUR: 113 mg/dL (ref 65–179)
GLUCOSE, FASTING: 77 mg/dL (ref 65–91)
Glucose, 2 hour: 93 mg/dL (ref 65–152)

## 2017-09-06 LAB — RPR: RPR Ser Ql: NONREACTIVE

## 2017-09-06 LAB — HIV ANTIBODY (ROUTINE TESTING W REFLEX): HIV SCREEN 4TH GENERATION: NONREACTIVE

## 2017-09-06 LAB — ANTIBODY SCREEN: Antibody Screen: NEGATIVE

## 2017-09-09 ENCOUNTER — Ambulatory Visit (INDEPENDENT_AMBULATORY_CARE_PROVIDER_SITE_OTHER): Payer: Medicaid Other | Admitting: Obstetrics & Gynecology

## 2017-09-09 ENCOUNTER — Encounter: Payer: Self-pay | Admitting: Obstetrics & Gynecology

## 2017-09-09 ENCOUNTER — Other Ambulatory Visit: Payer: Self-pay

## 2017-09-09 VITALS — BP 100/62 | HR 102 | Wt 186.0 lb

## 2017-09-09 DIAGNOSIS — Z91199 Patient's noncompliance with other medical treatment and regimen due to unspecified reason: Secondary | ICD-10-CM

## 2017-09-09 DIAGNOSIS — Z9119 Patient's noncompliance with other medical treatment and regimen: Secondary | ICD-10-CM

## 2017-09-09 DIAGNOSIS — Z3A31 31 weeks gestation of pregnancy: Secondary | ICD-10-CM

## 2017-09-09 DIAGNOSIS — Z3483 Encounter for supervision of other normal pregnancy, third trimester: Secondary | ICD-10-CM

## 2017-09-09 DIAGNOSIS — Z331 Pregnant state, incidental: Secondary | ICD-10-CM

## 2017-09-09 DIAGNOSIS — Z1389 Encounter for screening for other disorder: Secondary | ICD-10-CM

## 2017-09-09 LAB — POCT URINALYSIS DIPSTICK
Blood, UA: NEGATIVE
GLUCOSE UA: NEGATIVE
Ketones, UA: NEGATIVE
Leukocytes, UA: NEGATIVE
Nitrite, UA: NEGATIVE

## 2017-09-09 NOTE — Progress Notes (Signed)
[id number] [id number] Estimated Date of Delivery: 11/11/17  Blood pressure 100/62, pulse (!) 102, weight 186 lb (84.4 kg), last menstrual period 02/04/2017.   BP weight and urine results all reviewed and noted.  Please refer to the obstetrical flow sheet for the fundal height and fetal heart rate documentation:  Patient reports good fetal movement, denies any bleeding and no rupture of membranes symptoms or regular contractions. Patient is without complaints. All questions were answered.  Orders Placed This Encounter  Procedures  . POCT urinalysis dipstick    Plan:  Continued routine obstetrical care,   Return in about 2 weeks (around 09/23/2017) for LROB.

## 2017-09-23 ENCOUNTER — Ambulatory Visit (HOSPITAL_COMMUNITY)
Admission: RE | Admit: 2017-09-23 | Discharge: 2017-09-23 | Disposition: A | Discharge status: Home / Self Care | Payer: Medicaid Other | Source: Ambulatory Visit | Attending: Women's Health | Admitting: Women's Health

## 2017-09-23 ENCOUNTER — Encounter: Payer: Self-pay | Admitting: Women's Health

## 2017-09-23 ENCOUNTER — Other Ambulatory Visit: Payer: Self-pay

## 2017-09-23 ENCOUNTER — Ambulatory Visit (INDEPENDENT_AMBULATORY_CARE_PROVIDER_SITE_OTHER): Payer: Medicaid Other

## 2017-09-23 ENCOUNTER — Ambulatory Visit (INDEPENDENT_AMBULATORY_CARE_PROVIDER_SITE_OTHER): Payer: Medicaid Other | Admitting: Women's Health

## 2017-09-23 VITALS — BP 118/70 | HR 90 | Wt 184.0 lb

## 2017-09-23 DIAGNOSIS — J40 Bronchitis, not specified as acute or chronic: Secondary | ICD-10-CM | POA: Diagnosis not present

## 2017-09-23 DIAGNOSIS — R05 Cough: Secondary | ICD-10-CM

## 2017-09-23 DIAGNOSIS — O99513 Diseases of the respiratory system complicating pregnancy, third trimester: Secondary | ICD-10-CM | POA: Insufficient documentation

## 2017-09-23 DIAGNOSIS — Z3483 Encounter for supervision of other normal pregnancy, third trimester: Secondary | ICD-10-CM

## 2017-09-23 DIAGNOSIS — Z3A33 33 weeks gestation of pregnancy: Secondary | ICD-10-CM

## 2017-09-23 DIAGNOSIS — O26843 Uterine size-date discrepancy, third trimester: Secondary | ICD-10-CM

## 2017-09-23 DIAGNOSIS — R058 Other specified cough: Secondary | ICD-10-CM

## 2017-09-23 DIAGNOSIS — R079 Chest pain, unspecified: Secondary | ICD-10-CM

## 2017-09-23 DIAGNOSIS — Z1389 Encounter for screening for other disorder: Secondary | ICD-10-CM

## 2017-09-23 DIAGNOSIS — O26893 Other specified pregnancy related conditions, third trimester: Secondary | ICD-10-CM | POA: Diagnosis not present

## 2017-09-23 DIAGNOSIS — R1010 Upper abdominal pain, unspecified: Secondary | ICD-10-CM

## 2017-09-23 DIAGNOSIS — O9989 Other specified diseases and conditions complicating pregnancy, childbirth and the puerperium: Secondary | ICD-10-CM

## 2017-09-23 DIAGNOSIS — Z331 Pregnant state, incidental: Secondary | ICD-10-CM

## 2017-09-23 LAB — POCT URINALYSIS DIPSTICK
Blood, UA: NEGATIVE
Glucose, UA: NEGATIVE
KETONES UA: NEGATIVE
Leukocytes, UA: NEGATIVE
NITRITE UA: NEGATIVE
PROTEIN UA: NEGATIVE

## 2017-09-23 NOTE — Progress Notes (Signed)
LOW-RISK PREGNANCY VISIT Patient name: Marissa Friedman MRN [id number]  Date of birth: May 05, 1996 Chief Complaint:   Routine Prenatal Visit (c/o right side pain and cough for a month)  History of Present Illness:   Marissa Friedman is a 22 y.o. G9P2002 female at [redacted]w[redacted]d with an Estimated Date of Delivery: 11/11/17 being seen today for ongoing management of a low-risk pregnancy. Mucinex, apap Today she reports productive cough x 1 month- started off as cold/cough, cough has lingered- doesn't feel like it's getting any better at all. Denies fever/chills, any other current cold sx. Is taking mucinex and tylenol. Has Rt sided upper abd/lower chest pain x 1wk, constant, worse w/ pressure on that area, apap doesn't help. Contractions: Not present. Vag. Bleeding: None.  Movement: Present. denies leaking of fluid. Review of Systems:   Pertinent items are noted in HPI Denies abnormal vaginal discharge w/ itching/odor/irritation, headaches, visual changes, shortness of breath, chest pain, abdominal pain, severe nausea/vomiting, or problems with urination or bowel movements unless otherwise stated above. Pertinent History Reviewed:  Reviewed past medical,surgical, social, obstetrical and family history.  Reviewed problem list, medications and allergies. Physical Assessment:   Vitals:   09/23/17 1035  BP: 118/70  Pulse: 90  Weight: 184 lb (83.5 kg)  Body mass index is 31.58 kg/m.        Physical Examination:   General appearance: Well appearing, and in no distress  Mental status: Alert, oriented to person, place, and time  Skin: Warm & dry  Cardiovascular: Normal heart rate noted  Respiratory: Normal respiratory effort, no distress, Lungs clear bilaterally, however sound diminished in lower lobes  Abdomen: Soft, gravid, nontender, pain to palpation of Rt lower ribs  Pelvic: Cervical exam deferred         Extremities: Edema: None  Fetal Status: Fetal Heart Rate (bpm): 137 Fundal Height: 29 cm  Movement: Present    Results for orders placed or performed in visit on 09/23/17 (from the past 24 hour(s))  POCT urinalysis dipstick   Collection Time: 09/23/17 10:35 AM  Result Value Ref Range   Color, UA     Clarity, UA     Glucose, UA neg    Bilirubin, UA     Ketones, UA neg    Spec Grav, UA  1.010 - 1.025   Blood, UA neg    pH, UA  5.0 - 8.0   Protein, UA neg    Urobilinogen, UA  0.2 or 1.0 E.U./dL   Nitrite, UA neg    Leukocytes, UA Negative Negative   Appearance     Odor      Assessment & Plan:  1) Low-risk pregnancy G3P2002 at [redacted]w[redacted]d with an Estimated Date of Delivery: 11/11/17   2) Productive cough x & Rt upper abd/lower chest/rib pain, not improving at all, with diminished breath sounds bilat lower lobes- will get CXR, will call her w/ results  3) Uterine size < dates- will get efw/afi u/s   Meds: No orders of the defined types were placed in this encounter.  Labs/procedures today: chest xray  Plan:  Continue routine obstetrical care   Reviewed: Preterm labor symptoms and general obstetric precautions including but not limited to vaginal bleeding, contractions, leaking of fluid and fetal movement were reviewed in detail with the patient.  All questions were answered  Follow-up: Return for asap for efw/afi u/s (no visit), then 2wks for LROB .  Orders Placed This Encounter  Procedures  . DG Chest 2 View  .  US OB Follow Up  . POCT urinalysis dipstick   Cheral MarkerKimberly R Taya Ashbaugh CNM, [facility] LLCWHNP-BC 09/23/2017 11:07 AM

## 2017-09-23 NOTE — Patient Instructions (Addendum)
Marissa Friedman, I greatly value your feedback.  If you receive a survey following your visit with [place] today, we appreciate you taking the time to fill it out.  Thanks, Joellyn Haff, CNM, WHNP-BC   Humidifier and saline nasal spray for nasal congestion  Regular robitussin, cough drops for cough  Warm salt water gargles for sore throat  Mucinex with lots of water to help you cough up the mucous in your chest if needed  Drink plenty of fluids and stay hydrated!  Wash your hands frequently.       Call the office [phone]) or go to [facility] if:  You begin to have strong, frequent contractions  Your water breaks.  Sometimes it is a big gush of fluid, sometimes it is just a trickle that keeps getting your panties wet or running down your legs  You have vaginal bleeding.  It is normal to have a small amount of spotting if your cervix was checked.   You don't feel your baby moving like normal.  If you don't, get you something to eat and drink and lay down and focus on feeling your baby move.  You should feel at least 10 movements in 2 hours.  If you don't, you should call the office or go to [facility].    Tdap Vaccine  It is recommended that you get the Tdap vaccine during the third trimester of EACH pregnancy to help protect your baby from getting pertussis (whooping cough)  27-36 weeks is the BEST time to do this so that you can pass the protection on to your baby. During pregnancy is better than after pregnancy, but if you are unable to get it during pregnancy it will be offered at the hospital.   You can get this vaccine at the health department or your family doctor  Everyone who will be around your baby should also be up-to-date on their vaccines. Adults (who are not pregnant) only need 1 dose of Tdap during adulthood.   Third Trimester of Pregnancy The third trimester is from week 29 through week 42, months 7 through 9. The third trimester is a time when the  fetus is growing rapidly. At the end of the ninth month, the fetus is about 20 inches in length and weighs 6-10 pounds.  BODY CHANGES Your body goes through many changes during pregnancy. The changes vary from woman to woman.   Your weight will continue to increase. You can expect to gain 25-35 pounds (11-16 kg) by the end of the pregnancy.  You may begin to get stretch marks on your hips, abdomen, and breasts.  You may urinate more often because the fetus is moving lower into your pelvis and pressing on your bladder.  You may develop or continue to have heartburn as a result of your pregnancy.  You may develop constipation because certain hormones are causing the muscles that push waste through your intestines to slow down.  You may develop hemorrhoids or swollen, bulging veins (varicose veins).  You may have pelvic pain because of the weight gain and pregnancy hormones relaxing your joints between the bones in your pelvis. Backaches may result from overexertion of the muscles supporting your posture.  You may have changes in your hair. These can include thickening of your hair, rapid growth, and changes in texture. Some women also have hair loss during or after pregnancy, or hair that feels dry or thin. Your hair will most likely return to normal after your baby  is born.  Your breasts will continue to grow and be tender. A yellow discharge may leak from your breasts called colostrum.  Your belly button may stick out.  You may feel short of breath because of your expanding uterus.  You may notice the fetus "dropping," or moving lower in your abdomen.  You may have a bloody mucus discharge. This usually occurs a few days to a week before labor begins.  Your cervix becomes thin and soft (effaced) near your due date. WHAT TO EXPECT AT YOUR PRENATAL EXAMS  You will have prenatal exams every 2 weeks until week 36. Then, you will have weekly prenatal exams. During a routine prenatal  visit:  You will be weighed to make sure you and the fetus are growing normally.  Your blood pressure is taken.  Your abdomen will be measured to track your baby's growth.  The fetal heartbeat will be listened to.  Any test results from the previous visit will be discussed.  You may have a cervical check near your due date to see if you have effaced. At around 36 weeks, your caregiver will check your cervix. At the same time, your caregiver will also perform a test on the secretions of the vaginal tissue. This test is to determine if a type of bacteria, Group B streptococcus, is present. Your caregiver will explain this further. Your caregiver may ask you:  What your birth plan is.  How you are feeling.  If you are feeling the baby move.  If you have had any abnormal symptoms, such as leaking fluid, bleeding, severe headaches, or abdominal cramping.  If you have any questions. Other tests or screenings that may be performed during your third trimester include:  Blood tests that check for low iron levels (anemia).  Fetal testing to check the health, activity level, and growth of the fetus. Testing is done if you have certain medical conditions or if there are problems during the pregnancy. FALSE LABOR You may feel small, irregular contractions that eventually go away. These are called Braxton Hicks contractions, or false labor. Contractions may last for hours, days, or even weeks before true labor sets in. If contractions come at regular intervals, intensify, or become painful, it is best to be seen by your caregiver.  SIGNS OF LABOR   Menstrual-like cramps.  Contractions that are 5 minutes apart or less.  Contractions that start on the top of the uterus and spread down to the lower abdomen and back.  A sense of increased pelvic pressure or back pain.  A watery or bloody mucus discharge that comes from the vagina. If you have any of these signs before the 37th week of  pregnancy, call your caregiver right away. You need to go to the hospital to get checked immediately. HOME CARE INSTRUCTIONS   Avoid all smoking, herbs, alcohol, and unprescribed drugs. These chemicals affect the formation and growth of the baby.  Follow your caregiver's instructions regarding medicine use. There are medicines that are either safe or unsafe to take during pregnancy.  Exercise only as directed by your caregiver. Experiencing uterine cramps is a good sign to stop exercising.  Continue to eat regular, healthy meals.  Wear a good support bra for breast tenderness.  Do not use hot tubs, steam rooms, or saunas.  Wear your seat belt at all times when driving.  Avoid raw meat, uncooked cheese, cat litter boxes, and soil used by cats. These carry germs that can cause birth defects in  the baby.  Take your prenatal vitamins.  Try taking a stool softener (if your caregiver approves) if you develop constipation. Eat more high-fiber foods, such as fresh vegetables or fruit and whole grains. Drink plenty of fluids to keep your urine clear or pale yellow.  Take warm sitz baths to soothe any pain or discomfort caused by hemorrhoids. Use hemorrhoid cream if your caregiver approves.  If you develop varicose veins, wear support hose. Elevate your feet for 15 minutes, 3-4 times a day. Limit salt in your diet.  Avoid heavy lifting, wear low heal shoes, and practice good posture.  Rest a lot with your legs elevated if you have leg cramps or low back pain.  Visit your dentist if you have not gone during your pregnancy. Use a soft toothbrush to brush your teeth and be gentle when you floss.  A sexual relationship may be continued unless your caregiver directs you otherwise.  Do not travel far distances unless it is absolutely necessary and only with the approval of your caregiver.  Take prenatal classes to understand, practice, and ask questions about the labor and delivery.  Make a  trial run to the hospital.  Pack your hospital bag.  Prepare the baby's nursery.  Continue to go to all your prenatal visits as directed by your caregiver. SEEK MEDICAL CARE IF:  You are unsure if you are in labor or if your water has broken.  You have dizziness.  You have mild pelvic cramps, pelvic pressure, or nagging pain in your abdominal area.  You have persistent nausea, vomiting, or diarrhea.  You have a bad smelling vaginal discharge.  You have pain with urination. SEEK IMMEDIATE MEDICAL CARE IF:   You have a fever.  You are leaking fluid from your vagina.  You have spotting or bleeding from your vagina.  You have severe abdominal cramping or pain.  You have rapid weight loss or gain.  You have shortness of breath with chest pain.  You notice sudden or extreme swelling of your face, hands, ankles, feet, or legs.  You have not felt your baby move in over an hour.  You have severe headaches that do not go away with medicine.  You have vision changes. Document Released: 06/12/2001 Document Revised: 06/23/2013 Document Reviewed: 08/19/2012 [facility] Patient Information 2015 [place], [place]. This information is not intended to replace advice given to you by your health care provider. Make sure you discuss any questions you have with your health care provider.

## 2017-09-23 NOTE — Progress Notes (Signed)
US 33 wks,cephalic,posterior pl gr 3,afi 15 cm,fhr 138 bpm,normal ovaries bilat,efw 2048 g 35%

## 2017-09-30 ENCOUNTER — Telehealth: Payer: Self-pay | Admitting: Women's Health

## 2017-09-30 NOTE — Telephone Encounter (Signed)
Called pt, notified of normal cxr, questionable bronchitis, pt feels better, still slight cough. Will hold off on tx for now since feeling better, let us know if worsens.  Cheral MarkerKimberly R. Seham Gardenhire, CNM, [facility]NP-BC 09/30/2017 5:36 PM

## 2017-10-07 ENCOUNTER — Ambulatory Visit (INDEPENDENT_AMBULATORY_CARE_PROVIDER_SITE_OTHER): Payer: Medicaid Other | Admitting: Women's Health

## 2017-10-07 ENCOUNTER — Encounter: Payer: Self-pay | Admitting: Women's Health

## 2017-10-07 VITALS — BP 116/70 | HR 108 | Wt 188.0 lb

## 2017-10-07 DIAGNOSIS — Z3A35 35 weeks gestation of pregnancy: Secondary | ICD-10-CM

## 2017-10-07 DIAGNOSIS — Z1389 Encounter for screening for other disorder: Secondary | ICD-10-CM

## 2017-10-07 DIAGNOSIS — O26843 Uterine size-date discrepancy, third trimester: Secondary | ICD-10-CM

## 2017-10-07 DIAGNOSIS — Z331 Pregnant state, incidental: Secondary | ICD-10-CM

## 2017-10-07 DIAGNOSIS — Z3483 Encounter for supervision of other normal pregnancy, third trimester: Secondary | ICD-10-CM

## 2017-10-07 LAB — POCT URINALYSIS DIPSTICK
Blood, UA: NEGATIVE
GLUCOSE UA: NEGATIVE
Ketones, UA: NEGATIVE
NITRITE UA: NEGATIVE
PROTEIN UA: NEGATIVE

## 2017-10-07 NOTE — Progress Notes (Signed)
   LOW-RISK PREGNANCY VISIT Patient name: Marissa PatersonBrittany N Langone MRN [id number]  Date of birth: 1996/05/27 Chief Complaint:   Routine Prenatal Visit  History of Present Illness:   Marissa Friedman is a 22 y.o. 193P2002 female at 1276w0d with an Estimated Date of Delivery: 11/11/17 being seen today for ongoing management of a low-risk pregnancy.  Today she reports no complaints. Productive cough about the same, only symptom she has. Hasn't tried claritin or zyrtec. Contractions: Not present. Vag. Bleeding: None.  Movement: Present. denies leaking of fluid. Review of Systems:   Pertinent items are noted in HPI Denies abnormal vaginal discharge w/ itching/odor/irritation, headaches, visual changes, shortness of breath, chest pain, abdominal pain, severe nausea/vomiting, or problems with urination or bowel movements unless otherwise stated above. Pertinent History Reviewed:  Reviewed past medical,surgical, social, obstetrical and family history.  Reviewed problem list, medications and allergies. Physical Assessment:   Vitals:   10/07/17 0905  BP: 116/70  Pulse: (!) 108  Weight: 188 lb (85.3 kg)  Body mass index is 32.27 kg/m.        Physical Examination:   General appearance: Well appearing, and in no distress  Mental status: Alert, oriented to person, place, and time  Skin: Warm & dry  Cardiovascular: Normal heart rate noted  Respiratory: Normal respiratory effort, no distress  Abdomen: Soft, gravid, nontender  Pelvic: Cervical exam deferred         Extremities: Edema: Trace  Fetal Status:     Movement: Present    Results for orders placed or performed in visit on 10/07/17 (from the past 24 hour(s))  POCT urinalysis dipstick   Collection Time: 10/07/17  9:06 AM  Result Value Ref Range   Color, UA     Clarity, UA     Glucose, UA neg    Bilirubin, UA     Ketones, UA neg    Spec Grav, UA  1.010 - 1.025   Blood, UA neg    pH, UA  5.0 - 8.0   Protein, UA neg    Urobilinogen, UA  0.2  or 1.0 E.U./dL   Nitrite, UA neg    Leukocytes, UA Trace (A) Negative   Appearance     Odor      Assessment & Plan:  1) Low-risk pregnancy G3P2002 at 4676w0d with an Estimated Date of Delivery: 11/11/17   2) Uterine size < dates, last EFW 35%/AFI 15cm @ 33wks, done for same reason, will continue to monitor   Meds: No orders of the defined types were placed in this encounter.  Labs/procedures today: none  Plan:  Continue routine obstetrical care   Reviewed: Preterm labor symptoms and general obstetric precautions including but not limited to vaginal bleeding, contractions, leaking of fluid and fetal movement were reviewed in detail with the patient.  All questions were answered  Follow-up: Return in about 1 week (around 10/14/2017) for LROB.  Orders Placed This Encounter  Procedures  . POCT urinalysis dipstick   Cheral MarkerKimberly R Gamaliel Charney CNM, [facility]NP-BC 10/07/2017 9:38 AM

## 2017-10-07 NOTE — Patient Instructions (Addendum)
Marissa Friedman, I greatly value your feedback.  If you receive a survey following your visit with us today, we appreciate you taking the time to fill it out.  Thanks, Joellyn HaffKim Booker, CNM, WHNP-BC   Call the office [phone]) or go to [facility] if:  You begin to have strong, frequent contractions  Your water breaks.  Sometimes it is a big gush of fluid, sometimes it is just a trickle that keeps getting your panties wet or running down your legs  You have vaginal bleeding.  It is normal to have a small amount of spotting if your cervix was checked.   You don't feel your baby moving like normal.  If you don't, get you something to eat and drink and lay down and focus on feeling your baby move.  You should feel at least 10 movements in 2 hours.  If you don't, you should call the office or [facility] to [facility].     Preterm Labor and Birth Information The normal length of a pregnancy is 39-41 weeks. Preterm labor is when labor starts before 37 completed weeks of pregnancy. What are the risk factors for preterm labor? Preterm labor is more likely to occur in women who:  Have certain infections during pregnancy such as a bladder infection, sexually transmitted infection, or infection inside the uterus (chorioamnionitis).  Have a shorter-than-normal cervix.  Have gone into preterm labor before.  Have had surgery on their cervix.  Are younger than age 24 or older than age 24.  Are African American.  Are pregnant with twins or multiple babies (multiple gestation).  Take street drugs or smoke while pregnant.  Do not gain enough weight while pregnant.  Became pregnant shortly after having been pregnant.  What are the symptoms of preterm labor? Symptoms of preterm labor include:  Cramps similar to those that can happen during a menstrual period. The cramps may happen with diarrhea.  Pain in the abdomen or lower back.  Regular uterine contractions that may feel like  tightening of the abdomen.  A feeling of increased pressure in the pelvis.  Increased watery or bloody mucus discharge from the vagina.  Water breaking (ruptured amniotic sac).  Why is it important to recognize signs of preterm labor? It is important to recognize signs of preterm labor because babies who are born prematurely may not be fully developed. This can put them at an increased risk for:  Long-term (chronic) heart and lung problems.  Difficulty immediately after birth with regulating body systems, including blood sugar, body temperature, heart rate, and breathing rate.  Bleeding in the brain.  Cerebral palsy.  Learning difficulties.  Death.  These risks are highest for babies who are born before 34 weeks of pregnancy. How is preterm labor treated? Treatment depends on the length of your pregnancy, your condition, and the health of your baby. It may involve:  Having a stitch (suture) placed in your cervix to prevent your cervix from opening too early (cerclage).  Taking or being given medicines, such as: ? Hormone medicines. These may be given early in pregnancy to help support the pregnancy. ? Medicine to stop contractions. ? Medicines to help mature the baby's lungs. These may be prescribed if the risk of delivery is high. ? Medicines to prevent your baby from developing cerebral palsy.  If the labor happens before 34 weeks of pregnancy, you may need to stay in the hospital. What should I do if I think I am in preterm labor? If  you think that you are going into preterm labor, call your health care provider right away. How can I prevent preterm labor in future pregnancies? To increase your chance of having a full-term pregnancy:  Do not use any tobacco products, such as cigarettes, chewing tobacco, and e-cigarettes. If you need help quitting, ask your health care provider.  Do not use street drugs or medicines that have not been prescribed to you during your  pregnancy.  Talk with your health care provider before taking any herbal supplements, even if you have been taking them regularly.  Make sure you gain a healthy amount of weight during your pregnancy.  Watch for infection. If you think that you might have an infection, get it checked right away.  Make sure to tell your health care provider if you have gone into preterm labor before.  This information is not intended to replace advice given to you by your health care provider. Make sure you discuss any questions you have with your health care provider. Document Released: 09/08/2003 Document Revised: 11/29/2015 Document Reviewed: 11/09/2015 Elsevier Interactive Patient Education  2018 [employer].

## 2017-10-14 ENCOUNTER — Ambulatory Visit (INDEPENDENT_AMBULATORY_CARE_PROVIDER_SITE_OTHER): Payer: Medicaid Other | Admitting: Obstetrics and Gynecology

## 2017-10-14 ENCOUNTER — Encounter: Payer: Self-pay | Admitting: Obstetrics and Gynecology

## 2017-10-14 VITALS — BP 120/80 | HR 98 | Wt 183.8 lb

## 2017-10-14 DIAGNOSIS — Z3483 Encounter for supervision of other normal pregnancy, third trimester: Secondary | ICD-10-CM

## 2017-10-14 DIAGNOSIS — R102 Pelvic and perineal pain: Secondary | ICD-10-CM

## 2017-10-14 DIAGNOSIS — Z1389 Encounter for screening for other disorder: Secondary | ICD-10-CM

## 2017-10-14 DIAGNOSIS — O9989 Other specified diseases and conditions complicating pregnancy, childbirth and the puerperium: Secondary | ICD-10-CM

## 2017-10-14 DIAGNOSIS — Z3A36 36 weeks gestation of pregnancy: Secondary | ICD-10-CM

## 2017-10-14 DIAGNOSIS — Z331 Pregnant state, incidental: Secondary | ICD-10-CM

## 2017-10-14 LAB — POCT URINALYSIS DIPSTICK
GLUCOSE UA: NEGATIVE
Ketones, UA: NEGATIVE
LEUKOCYTES UA: NEGATIVE
NITRITE UA: NEGATIVE
PROTEIN UA: NEGATIVE
RBC UA: NEGATIVE

## 2017-10-14 LAB — OB RESULTS CONSOLE GBS: STREP GROUP B AG: NEGATIVE

## 2017-10-14 NOTE — Progress Notes (Signed)
   LOW-RISK PREGNANCY VISIT Patient name: Marissa Friedman MRN [id number]  Date of birth: 1996-02-06 Chief Complaint:   Routine Prenatal Visit (sharp pinching pain vaginal area)  History of Present Illness:   Marissa Friedman is a 22 y.o. 13P2002 female at 6542w0d with an Estimated Date of Delivery: 11/11/17 being seen today for ongoing management of a low-risk pregnancy.  Today she reports a sharp pinching pain in her vaginal area, right side predominantly. Contractions: Not present.  .  Movement: Present. denies leaking of fluid. Review of Systems:   Pertinent items are noted in HPI Denies abnormal vaginal discharge w/ itching/odor/irritation, headaches, visual changes, shortness of breath, chest pain, abdominal pain, severe nausea/vomiting, or problems with urination or bowel movements unless otherwise stated above. Pertinent History Reviewed:  Reviewed past medical,surgical, social, obstetrical and family history.  Reviewed problem list, medications and allergies. Physical Assessment:   Vitals:   10/14/17 1531  BP: 120/80  Pulse: 98  Weight: 183 lb 12.8 oz (83.4 kg)  Body mass index is 31.55 kg/m.        Physical Examination:   General appearance: Well appearing, and in no distress  Mental status: Alert, oriented to person, place, and time  Skin: Warm & dry  Cardiovascular: Normal heart rate noted  Respiratory: Normal respiratory effort, no distress  Abdomen: Soft, gravid, nontender 35 cm  Pelvic: Cervical exam performed , thick long closed        Extremities: Edema: None  Fetal Status: Fetal Heart Rate (bpm): 131 Fundal Height: 35 cm Movement: Present    Results for orders placed or performed in visit on 10/14/17 (from the past 24 hour(s))  POCT urinalysis dipstick   Collection Time: 10/14/17  3:40 PM  Result Value Ref Range   Color, UA     Clarity, UA     Glucose, UA neg    Bilirubin, UA     Ketones, UA neg    Spec Grav, UA  1.010 - 1.025   Blood, UA neg    pH, UA   5.0 - 8.0   Protein, UA neg    Urobilinogen, UA  0.2 or 1.0 E.U./dL   Nitrite, UA neg    Leukocytes, UA Negative Negative   Appearance     Odor      Assessment & Plan:  1) Low-risk pregnancy G3P2002 at 542w0d with an Estimated Date of Delivery: 11/11/17    Meds: No orders of the defined types were placed in this encounter.  Labs/procedures today: doppler, GBS, cervical exam  Plan:  Continue routine obstetrical care  Reviewed: Preterm labor symptoms and general obstetric precautions including but not limited to vaginal bleeding, contractions, leaking of fluid and fetal movement were reviewed in detail with the patient.  All questions were answered  Follow-up: Return in about 1 week (around 10/21/2017), or if symptoms worsen or fail to improve, for LROB.  Orders Placed This Encounter  Procedures  . POCT urinalysis dipstick    By signing my name below, I, Izna Ahmed, attest that this documentation has been prepared under the direction and in the presence of Tilda BurrowFerguson, Sabree Nuon V, MD. Electronically Signed: Redge GainerIzna Ahmed, Medical Scribe. 10/14/17. 3:56 PM.  I personally performed the services described in this documentation, which was SCRIBED in my presence. The recorded information has been reviewed and considered accurate. It has been edited as necessary during review. Tilda BurrowJohn V Cornelis Kluver, MD

## 2017-10-16 LAB — STREP GP B NAA+RFLX: Strep Gp B NAA+Rflx: NEGATIVE

## 2017-10-16 LAB — GC/CHLAMYDIA PROBE AMP
CHLAMYDIA, DNA PROBE: NEGATIVE
NEISSERIA GONORRHOEAE BY PCR: NEGATIVE

## 2017-10-21 ENCOUNTER — Encounter: Payer: Medicaid Other | Admitting: Women's Health

## 2017-10-25 ENCOUNTER — Encounter: Payer: Medicaid Other | Admitting: Women's Health

## 2017-10-28 ENCOUNTER — Telehealth: Payer: Self-pay | Admitting: *Deleted

## 2017-10-28 MED ORDER — PROMETHAZINE HCL 25 MG PO TABS: 12.5000 mg | ORAL_TABLET | Freq: Four times a day (QID) | ORAL | 0 refills | Status: DC | PRN

## 2017-10-28 NOTE — Telephone Encounter (Signed)
LMOVM that medication was sent to pharmacy but can cause drowsiness so advise not to take and drive.

## 2017-10-28 NOTE — Telephone Encounter (Signed)
Patient states her family has had the stomach virus this week and weekend.  She has been experiencing nausea for about a week.  Encouraged patient to sip fluids to avoid dehydration, verbalized understanding but is requesting medication for nausea as well. Please advise.

## 2017-10-28 NOTE — Addendum Note (Signed)
Addended by: Cheral Marker on: 10/28/2017 10:29 AM   Modules accepted: Orders

## 2017-10-30 ENCOUNTER — Ambulatory Visit (INDEPENDENT_AMBULATORY_CARE_PROVIDER_SITE_OTHER): Payer: Medicaid Other | Admitting: Advanced Practice Midwife

## 2017-10-30 ENCOUNTER — Encounter: Payer: Self-pay | Admitting: Advanced Practice Midwife

## 2017-10-30 VITALS — BP 110/80 | HR 98 | Wt 186.0 lb

## 2017-10-30 DIAGNOSIS — Z3A38 38 weeks gestation of pregnancy: Secondary | ICD-10-CM

## 2017-10-30 DIAGNOSIS — Z1389 Encounter for screening for other disorder: Secondary | ICD-10-CM

## 2017-10-30 DIAGNOSIS — Z331 Pregnant state, incidental: Secondary | ICD-10-CM

## 2017-10-30 DIAGNOSIS — Z3483 Encounter for supervision of other normal pregnancy, third trimester: Secondary | ICD-10-CM

## 2017-10-30 NOTE — Progress Notes (Signed)
  [id number] [redacted]w[redacted]d Estimated Date of Delivery: 11/11/17  Blood pressure 110/80, pulse 98, weight 186 lb (84.4 kg), last menstrual period 02/04/2017.   BP weight and urine results all reviewed and noted.  Please refer to the obstetrical flow sheet for the fundal height and fetal heart rate documentation:  Patient reports good fetal movement, denies any bleeding and no rupture of membranes symptoms or regular contractions. Patient is without complaints. All questions were answered.   Physical Assessment:   Vitals:   10/30/17 0843  BP: 110/80  Pulse: 98  Weight: 186 lb (84.4 kg)  Body mass index is 31.93 kg/m.        Physical Examination:   General appearance: Well appearing, and in no distress  Mental status: Alert, oriented to person, place, and time  Skin: Warm & dry  Cardiovascular: Normal heart rate noted  Respiratory: Normal respiratory effort, no distress  Abdomen: Soft, gravid, nontender  Pelvic: Cervical exam performed  Dilation: Closed Effacement (%): Thick Station: -3  Extremities: Edema: None  Fetal Status: Fetal Heart Rate (bpm): 155 Fundal Height: 36 cm Movement: Present Presentation: Vertex  No results found for this or any previous visit (from the past 24 hour(s)).   Orders Placed This Encounter  Procedures  . POCT urinalysis dipstick    Plan:  Continued routine obstetrical care,   Return in about 1 week (around 11/06/2017) for LROB.

## 2017-10-30 NOTE — Patient Instructions (Signed)

## 2017-11-05 NOTE — Addendum Note (Signed)
Addended by: Moss Mc on: 11/05/2017 03:24 PM   Modules accepted: Orders

## 2017-11-06 ENCOUNTER — Ambulatory Visit (INDEPENDENT_AMBULATORY_CARE_PROVIDER_SITE_OTHER): Payer: Medicaid Other | Admitting: Advanced Practice Midwife

## 2017-11-06 ENCOUNTER — Encounter: Payer: Self-pay | Admitting: Advanced Practice Midwife

## 2017-11-06 VITALS — BP 110/70 | HR 99 | Wt 188.0 lb

## 2017-11-06 DIAGNOSIS — Z331 Pregnant state, incidental: Secondary | ICD-10-CM

## 2017-11-06 DIAGNOSIS — Z1389 Encounter for screening for other disorder: Secondary | ICD-10-CM

## 2017-11-06 DIAGNOSIS — Z3A39 39 weeks gestation of pregnancy: Secondary | ICD-10-CM

## 2017-11-06 DIAGNOSIS — O480 Post-term pregnancy: Secondary | ICD-10-CM

## 2017-11-06 DIAGNOSIS — Z3483 Encounter for supervision of other normal pregnancy, third trimester: Secondary | ICD-10-CM

## 2017-11-06 LAB — POCT URINALYSIS DIPSTICK
Blood, UA: NEGATIVE
GLUCOSE UA: NEGATIVE
KETONES UA: NEGATIVE
Leukocytes, UA: NEGATIVE
Nitrite, UA: NEGATIVE

## 2017-11-06 NOTE — Progress Notes (Signed)
  [id number] [redacted]w[redacted]d Estimated Date of Delivery: 11/11/17  Blood pressure 110/70, pulse 99, weight 188 lb (85.3 kg), last menstrual period 02/04/2017.   BP weight and urine results all reviewed and noted.  Please refer to the obstetrical flow sheet for the fundal height and fetal heart rate documentation:  Patient reports good fetal movement, denies any bleeding and no rupture of membranes symptoms or regular contractions. Patient is without complaints. All questions were answered.   Physical Assessment:   Vitals:   11/06/17 0839  BP: 110/70  Pulse: 99  Weight: 188 lb (85.3 kg)  Body mass index is 32.27 kg/m.        Physical Examination:   General appearance: Well appearing, and in no distress  Mental status: Alert, oriented to person, place, and time  Skin: Warm & dry  Cardiovascular: Normal heart rate noted  Respiratory: Normal respiratory effort, no distress  Abdomen: Soft, gravid, nontender  Pelvic: Cervical exam performed  Dilation: Closed Effacement (%): Thick Station: -3  Extremities: Edema: None  Fetal Status:   Fundal Height: 37 cm Movement: Present Presentation: Vertex  Results for orders placed or performed in visit on 11/06/17 (from the past 24 hour(s))  POCT urinalysis dipstick   Collection Time: 11/06/17  8:43 AM  Result Value Ref Range   Color, UA     Clarity, UA     Glucose, UA neg    Bilirubin, UA     Ketones, UA neg    Spec Grav, UA  1.010 - 1.025   Blood, UA neg    pH, UA  5.0 - 8.0   Protein, UA trace    Urobilinogen, UA  0.2 or 1.0 E.U./dL   Nitrite, UA neg    Leukocytes, UA Negative Negative   Appearance     Odor       Orders Placed This Encounter  Procedures  . US FETAL BPP WO NON STRESS  . POCT urinalysis dipstick    Plan:  Continued routine obstetrical care, ripening info given  Return in about 8 days (around 11/14/2017) for LROB, US:BPP.

## 2017-11-06 NOTE — Patient Instructions (Signed)
Cervical Ripening: May try one or both  Red Raspberry Leaf capsules: two 300mg or 400mg tablets with each meal, 2-3 times a day  Potential Side Effects Of Raspberry Leaf:  Most women do not experience any side effects from drinking raspberry leaf tea. However, nausea and loose stools are possible   Evening Primrose Oil capsules: 3 500 mg capsules daily.  Some of the potential side effects:  Upset stomach  Loose stools or diarrhea  Headaches  Nausea:     

## 2017-11-13 ENCOUNTER — Telehealth (HOSPITAL_COMMUNITY): Payer: Self-pay | Admitting: *Deleted

## 2017-11-13 ENCOUNTER — Other Ambulatory Visit: Payer: Self-pay | Admitting: Obstetrics and Gynecology

## 2017-11-13 ENCOUNTER — Encounter (HOSPITAL_COMMUNITY): Payer: Self-pay | Admitting: *Deleted

## 2017-11-13 ENCOUNTER — Ambulatory Visit (INDEPENDENT_AMBULATORY_CARE_PROVIDER_SITE_OTHER): Payer: Medicaid Other

## 2017-11-13 ENCOUNTER — Ambulatory Visit (INDEPENDENT_AMBULATORY_CARE_PROVIDER_SITE_OTHER): Payer: Medicaid Other | Admitting: Obstetrics and Gynecology

## 2017-11-13 ENCOUNTER — Other Ambulatory Visit: Payer: Self-pay | Admitting: Advanced Practice Midwife

## 2017-11-13 VITALS — BP 108/60 | Wt 188.6 lb

## 2017-11-13 DIAGNOSIS — Z3A40 40 weeks gestation of pregnancy: Secondary | ICD-10-CM

## 2017-11-13 DIAGNOSIS — Z1389 Encounter for screening for other disorder: Secondary | ICD-10-CM

## 2017-11-13 DIAGNOSIS — Z308 Encounter for other contraceptive management: Secondary | ICD-10-CM

## 2017-11-13 DIAGNOSIS — O480 Post-term pregnancy: Secondary | ICD-10-CM

## 2017-11-13 DIAGNOSIS — Z3403 Encounter for supervision of normal first pregnancy, third trimester: Secondary | ICD-10-CM

## 2017-11-13 DIAGNOSIS — Z3483 Encounter for supervision of other normal pregnancy, third trimester: Secondary | ICD-10-CM

## 2017-11-13 DIAGNOSIS — Z331 Pregnant state, incidental: Secondary | ICD-10-CM

## 2017-11-13 LAB — POCT URINALYSIS DIPSTICK
Blood, UA: NEGATIVE
Glucose, UA: NEGATIVE
KETONES UA: NEGATIVE
Leukocytes, UA: NEGATIVE
NITRITE UA: NEGATIVE
PROTEIN UA: NEGATIVE

## 2017-11-13 NOTE — Progress Notes (Signed)
[place] 40+2 wks,BPP 8/8,posterior pl gr 3,fhr 124 bpm,afi 15 cm

## 2017-11-13 NOTE — Telephone Encounter (Signed)
Preadmission screen  

## 2017-11-13 NOTE — Progress Notes (Signed)
Patient ID: Marissa Friedman, female   DOB: June 28, 1996, 22 y.o.   MRN: [id number]   LOW-RISK PREGNANCY VISIT Patient name: Marissa Friedman MRN [id number]  Date of birth: [birth year] Chief Complaint:   Routine Prenatal Visit  History of Present Illness:   [name] is a 22 y.o. G68P2002 female at [redacted]w[redacted]d with an Estimated Date of Delivery: 11/11/17 being seen today for ongoing management of a low-risk pregnancy.  Today she reports no complaints. Contractions: Irregular. Vag. Bleeding: None.  Movement: Present. denies leaking of fluid. Review of Systems:   Pertinent items are noted in HPI Denies abnormal vaginal discharge w/ itching/odor/irritation, headaches, visual changes, shortness of breath, chest pain, abdominal pain, severe nausea/vomiting, or problems with urination or bowel movements unless otherwise stated above. Pertinent History Reviewed:  Reviewed past medical,surgical, social, obstetrical and family history.  Reviewed problem list, medications and allergies. Physical Assessment:   Vitals:   11/13/17 1108  BP: 108/60  Weight: 188 lb 9.6 oz (85.5 kg)  Body mass index is 32.37 kg/m.        Physical Examination:   General appearance: Well appearing, and in no distress  Mental status: Alert, oriented to person, place, and time  Skin: Warm & dry  Cardiovascular: Normal heart rate noted  Respiratory: Normal respiratory effort, no distress  Abdomen: Soft, gravid, nontender  Pelvic: Cervical exam performed Long, soft Dilation: 1.5 Effacement (%): 0 Station: -2  Extremities: Edema: None  Fetal Status: Fetal Heart Rate (bpm): 142 Fundal Height: 38 cm Movement: Present    Results for orders placed or performed in visit on 11/13/17 (from the past 24 hour(s))  POCT Urinalysis Dipstick   Collection Time: 11/13/17 11:08 AM  Result Value Ref Range   Color, UA     Clarity, UA     Glucose, UA neg    Bilirubin, UA     Ketones, UA neg    Spec Grav, UA  1.010 - 1.025   Blood, UA neg    pH, UA  5.0 - 8.0   Protein, UA neg    Urobilinogen, UA  0.2 or 1.0 E.U./dL   Nitrite, UA neg    Leukocytes, UA Negative Negative   Appearance     Odor      Assessment & Plan:  1) Low-risk pregnancy G3P2002 at [redacted]w[redacted]d with an Estimated Date of Delivery: 11/11/17   2) Induction, 11/17/2017 @ 11:45pm   Meds: No orders of the defined types were placed in this encounter.  Labs/procedures today: [place] 40+2 wks,BPP 8/8,posterior pl gr 3,fhr 124 bpm,afi 15 cm  Plan:  Continue routine obstetrical care   Reviewed: Term labor symptoms and general obstetric precautions including but not limited to vaginal bleeding, contractions, leaking of fluid and fetal movement were reviewed in detail with the patient.  All questions were answered  Follow-up: Return in about 1 month (around 12/11/2017) for Postpartum chk.  Orders Placed This Encounter  Procedures  . POCT Urinalysis Dipstick   By signing my name below, I, Diona Browner, attest that this documentation has been prepared under the direction and in the presence of Tilda Burrow, MD. Electronically Signed: Diona Browner, Medical Scribe. 11/13/17. 11:46 AM.  I personally performed the services described in this documentation, which was SCRIBED in my presence. The recorded information has been reviewed and considered accurate. It has been edited as necessary during review. Tilda Burrow, MD

## 2017-11-18 ENCOUNTER — Inpatient Hospital Stay (HOSPITAL_COMMUNITY)
Admission: RE | Admit: 2017-11-18 | Discharge: 2017-11-20 | DRG: 807 | Disposition: A | Discharge status: Home / Self Care | Payer: Medicaid Other | Source: Ambulatory Visit | Attending: Obstetrics & Gynecology | Admitting: Obstetrics & Gynecology

## 2017-11-18 ENCOUNTER — Encounter (HOSPITAL_COMMUNITY): Payer: Self-pay

## 2017-11-18 ENCOUNTER — Inpatient Hospital Stay (HOSPITAL_COMMUNITY): Payer: Medicaid Other | Admitting: Anesthesiology

## 2017-11-18 DIAGNOSIS — Z3A41 41 weeks gestation of pregnancy: Secondary | ICD-10-CM | POA: Diagnosis not present

## 2017-11-18 DIAGNOSIS — O480 Post-term pregnancy: Principal | ICD-10-CM | POA: Diagnosis present

## 2017-11-18 DIAGNOSIS — O99214 Obesity complicating childbirth: Secondary | ICD-10-CM | POA: Diagnosis present

## 2017-11-18 DIAGNOSIS — E669 Obesity, unspecified: Secondary | ICD-10-CM | POA: Diagnosis present

## 2017-11-18 DIAGNOSIS — Z880 Allergy status to penicillin: Secondary | ICD-10-CM

## 2017-11-18 DIAGNOSIS — Z87891 Personal history of nicotine dependence: Secondary | ICD-10-CM | POA: Diagnosis not present

## 2017-11-18 LAB — CBC
HCT: 33.1 % — ABNORMAL LOW (ref 36.0–46.0)
Hemoglobin: 11.5 g/dL — ABNORMAL LOW (ref 12.0–15.0)
MCH: 30.9 pg (ref 26.0–34.0)
MCHC: 34.7 g/dL (ref 30.0–36.0)
MCV: 89 fL (ref 78.0–100.0)
Platelets: 139 10*3/uL — ABNORMAL LOW (ref 150–400)
RBC: 3.72 MIL/uL — ABNORMAL LOW (ref 3.87–5.11)
RDW: 14.6 % (ref 11.5–15.5)
WBC: 8.8 10*3/uL (ref 4.0–10.5)

## 2017-11-18 LAB — TYPE AND SCREEN
ABO/RH(D): O POS
Antibody Screen: NEGATIVE

## 2017-11-18 LAB — RPR: RPR Ser Ql: NONREACTIVE

## 2017-11-18 MED ORDER — IBUPROFEN 600 MG PO TABS
600.0000 mg | ORAL_TABLET | Freq: Four times a day (QID) | ORAL | Status: DC
  Administered 2017-11-19 – 2017-11-20 (×5): 600 mg via ORAL
  Filled 2017-11-18 (×8): qty 1

## 2017-11-18 MED ORDER — DIPHENHYDRAMINE HCL 50 MG/ML IJ SOLN: 12.5000 mg | INTRAMUSCULAR | Status: DC | PRN

## 2017-11-18 MED ORDER — LIDOCAINE HCL (PF) 1 % IJ SOLN
INTRAMUSCULAR | Status: DC | PRN
  Administered 2017-11-18: 4 mL via EPIDURAL
  Administered 2017-11-18: 6 mL via EPIDURAL

## 2017-11-18 MED ORDER — OXYTOCIN 40 UNITS IN LACTATED RINGERS INFUSION - SIMPLE MED
2.5000 [IU]/h | INTRAVENOUS | Status: DC
  Filled 2017-11-18: qty 1000

## 2017-11-18 MED ORDER — DIBUCAINE 1 % RE OINT: 1.0000 "application " | TOPICAL_OINTMENT | RECTAL | Status: DC | PRN

## 2017-11-18 MED ORDER — OXYTOCIN BOLUS FROM INFUSION
500.0000 mL | Freq: Once | INTRAVENOUS | Status: AC
  Administered 2017-11-18: 500 mL via INTRAVENOUS

## 2017-11-18 MED ORDER — EPHEDRINE 5 MG/ML INJ
10.0000 mg | INTRAVENOUS | Status: DC | PRN
  Filled 2017-11-18: qty 2

## 2017-11-18 MED ORDER — MEASLES, MUMPS & RUBELLA VAC ~~LOC~~ INJ
0.5000 mL | INJECTION | Freq: Once | SUBCUTANEOUS | Status: AC
  Administered 2017-11-19: 0.5 mL via SUBCUTANEOUS
  Filled 2017-11-18: qty 0.5

## 2017-11-18 MED ORDER — PHENYLEPHRINE 40 MCG/ML (10ML) SYRINGE FOR IV PUSH (FOR BLOOD PRESSURE SUPPORT)
80.0000 ug | PREFILLED_SYRINGE | INTRAVENOUS | Status: DC | PRN
  Filled 2017-11-18: qty 5

## 2017-11-18 MED ORDER — COCONUT OIL OIL: 1.0000 "application " | TOPICAL_OIL | Status: DC | PRN

## 2017-11-18 MED ORDER — ACETAMINOPHEN 325 MG PO TABS: 650.0000 mg | ORAL_TABLET | ORAL | Status: DC | PRN

## 2017-11-18 MED ORDER — SOD CITRATE-CITRIC ACID 500-334 MG/5ML PO SOLN: 30.0000 mL | ORAL | Status: DC | PRN

## 2017-11-18 MED ORDER — PRENATAL MULTIVITAMIN CH
1.0000 | ORAL_TABLET | Freq: Every day | ORAL | Status: DC
  Administered 2017-11-19 – 2017-11-20 (×2): 1 via ORAL
  Filled 2017-11-18 (×2): qty 1

## 2017-11-18 MED ORDER — LACTATED RINGERS IV SOLN
500.0000 mL | Freq: Once | INTRAVENOUS | Status: AC
  Administered 2017-11-18: 500 mL via INTRAVENOUS

## 2017-11-18 MED ORDER — ONDANSETRON HCL 4 MG/2ML IJ SOLN: 4.0000 mg | Freq: Four times a day (QID) | INTRAMUSCULAR | Status: DC | PRN

## 2017-11-18 MED ORDER — DIPHENHYDRAMINE HCL 25 MG PO CAPS
25.0000 mg | ORAL_CAPSULE | Freq: Four times a day (QID) | ORAL | Status: DC | PRN
  Administered 2017-11-19: 25 mg via ORAL
  Filled 2017-11-18: qty 1

## 2017-11-18 MED ORDER — OXYCODONE-ACETAMINOPHEN 5-325 MG PO TABS: 2.0000 | ORAL_TABLET | ORAL | Status: DC | PRN

## 2017-11-18 MED ORDER — OXYCODONE-ACETAMINOPHEN 5-325 MG PO TABS: 1.0000 | ORAL_TABLET | ORAL | Status: DC | PRN

## 2017-11-18 MED ORDER — LACTATED RINGERS IV SOLN: 500.0000 mL | INTRAVENOUS | Status: DC | PRN

## 2017-11-18 MED ORDER — SENNOSIDES-DOCUSATE SODIUM 8.6-50 MG PO TABS
2.0000 | ORAL_TABLET | ORAL | Status: DC
  Filled 2017-11-18 (×2): qty 2

## 2017-11-18 MED ORDER — FENTANYL 2.5 MCG/ML BUPIVACAINE 1/10 % EPIDURAL INFUSION (WH - ANES)
14.0000 mL/h | INTRAMUSCULAR | Status: DC | PRN
  Administered 2017-11-18: 14 mL/h via EPIDURAL
  Filled 2017-11-18: qty 100

## 2017-11-18 MED ORDER — SIMETHICONE 80 MG PO CHEW: 80.0000 mg | CHEWABLE_TABLET | ORAL | Status: DC | PRN

## 2017-11-18 MED ORDER — LACTATED RINGERS IV SOLN
INTRAVENOUS | Status: DC
  Administered 2017-11-18 (×3): via INTRAVENOUS

## 2017-11-18 MED ORDER — PHENYLEPHRINE 40 MCG/ML (10ML) SYRINGE FOR IV PUSH (FOR BLOOD PRESSURE SUPPORT)
80.0000 ug | PREFILLED_SYRINGE | INTRAVENOUS | Status: DC | PRN
  Filled 2017-11-18: qty 10
  Filled 2017-11-18: qty 5

## 2017-11-18 MED ORDER — WITCH HAZEL-GLYCERIN EX PADS: 1.0000 "application " | MEDICATED_PAD | CUTANEOUS | Status: DC | PRN

## 2017-11-18 MED ORDER — TETANUS-DIPHTH-ACELL PERTUSSIS 5-2.5-18.5 LF-MCG/0.5 IM SUSP: 0.5000 mL | Freq: Once | INTRAMUSCULAR | Status: DC

## 2017-11-18 MED ORDER — ONDANSETRON HCL 4 MG PO TABS: 4.0000 mg | ORAL_TABLET | ORAL | Status: DC | PRN

## 2017-11-18 MED ORDER — LIDOCAINE HCL (PF) 1 % IJ SOLN
30.0000 mL | INTRAMUSCULAR | Status: DC | PRN
  Filled 2017-11-18: qty 30

## 2017-11-18 MED ORDER — ONDANSETRON HCL 4 MG/2ML IJ SOLN: 4.0000 mg | INTRAMUSCULAR | Status: DC | PRN

## 2017-11-18 MED ORDER — ZOLPIDEM TARTRATE 5 MG PO TABS: 5.0000 mg | ORAL_TABLET | Freq: Every evening | ORAL | Status: DC | PRN

## 2017-11-18 MED ORDER — MISOPROSTOL 50MCG HALF TABLET
50.0000 ug | ORAL_TABLET | ORAL | Status: DC | PRN
  Filled 2017-11-18: qty 1

## 2017-11-18 MED ORDER — OXYTOCIN 40 UNITS IN LACTATED RINGERS INFUSION - SIMPLE MED
1.0000 m[IU]/min | INTRAVENOUS | Status: DC
  Administered 2017-11-18: 2 m[IU]/min via INTRAVENOUS

## 2017-11-18 MED ORDER — FENTANYL CITRATE (PF) 100 MCG/2ML IJ SOLN
100.0000 ug | INTRAMUSCULAR | Status: DC | PRN
  Administered 2017-11-18 (×2): 100 ug via INTRAVENOUS
  Filled 2017-11-18 (×2): qty 2

## 2017-11-18 MED ORDER — BENZOCAINE-MENTHOL 20-0.5 % EX AERO: 1.0000 "application " | INHALATION_SPRAY | CUTANEOUS | Status: DC | PRN

## 2017-11-18 MED ORDER — TERBUTALINE SULFATE 1 MG/ML IJ SOLN
0.2500 mg | Freq: Once | INTRAMUSCULAR | Status: DC | PRN
  Filled 2017-11-18: qty 1

## 2017-11-18 NOTE — Progress Notes (Signed)
LABOR PROGRESS NOTE  Marissa Friedman is a 22 y.o. G3P2002 at [redacted]w[redacted]d  admitted for IOL for post-dates pregnancy   Subjective: Patient doing well, breathing through some contractions   Objective: BP 103/63   Pulse 92   Temp 98 F (36.7 C) (Oral)   Resp 18   Ht  (1.626 m)   Wt 188 lb (85.3 kg)   LMP 02/04/2017 (Exact Date)   SpO2 98%   BMI 32.27 kg/m  or  Vitals:   11/18/17 0537 11/18/17 0601 11/18/17 0623 11/18/17 0631  BP: 123/89 121/66 114/68 103/63  Pulse: 93 78 69 92  Resp: 18     Temp: 98 F (36.7 C)     TempSrc: Oral     SpO2:      Weight:      Height:        Dilation: 3.5 Effacement (%): 50 Station: -3 Presentation: Vertex Exam by:: Steward Drone CNM  FHT: baseline rate 115, moderate varibility, +acel, no decel Toco: 2-3/ moderate by palpation   Labs: Lab Results  Component Value Date   WBC 8.8 11/18/2017   HGB 11.5 (L) 11/18/2017   HCT 33.1 (L) 11/18/2017   MCV 89.0 11/18/2017   PLT 139 (L) 11/18/2017    Patient Active Problem List   Diagnosis Date Noted  . Post term pregnancy over 40 weeks 11/18/2017  . Post-term pregnancy, 40-42 weeks of gestation 11/13/2017  . Pregnancy, post-term 11/13/2017  . Rubella non-immune status, antepartum 04/26/2017  . Supervision of normal pregnancy 04/25/2017  . Smoker 08/06/2014    Assessment / Plan: 22 y.o. G3P2002 at [redacted]w[redacted]d here for IOL for PD   Labor: Continue pitocin titration, possible AROM at next cervical exam in 4 hours  Fetal Wellbeing:  Cat I Pain Control:  Medications ordered PRN  Anticipated MOD:  SVD   Sharyon Cable, CNM 11/18/2017, 6:56 AM

## 2017-11-18 NOTE — H&P (Addendum)
LABOR AND DELIVERY ADMISSION HISTORY AND PHYSICAL NOTE  Marissa Friedman is a 22 y.o. female G68P2002 with IUP at [redacted]w[redacted]d by LMP c/w 1st trimester [place]  presenting for IOL for postdates pregnancy.  She reports positive fetal movement. She denies leakage of fluid or vaginal bleeding.  Prenatal History/Complications: [facility] Pregnancy complications:  - Rubella non immune, antepartum  - Anxiety   Past Medical History: Past Medical History:  Diagnosis Date  . Anxiety     Past Surgical History: Past Surgical History:  Procedure Laterality Date  . NO PAST SURGERIES      Obstetrical History: OB History    Gravida  3   Para  2   Term  2   Preterm      AB      Living  2     SAB      TAB      Ectopic      Multiple      Live Births  2        Obstetric Comments  [facility]        Social History: Social History   Socioeconomic History  . Marital status: Married    Spouse name: Not on file  . Number of children: Not on file  . Years of education: Not on file  . Highest education level: Not on file  Occupational History  . Not on file  Social Needs  . Financial resource strain: Not on file  . Food insecurity:    Worry: Not on file    Inability: Not on file  . Transportation needs:    Medical: Not on file    Non-medical: Not on file  Tobacco Use  . Smoking status: Former Smoker    Packs/day: 0.00    Years: 5.00    Pack years: 0.00    Types: Cigarettes  . Smokeless tobacco: Never Used  Substance and Sexual Activity  . Alcohol use: No    Comment: occasional. None since +preg test.  . Drug use: No  . Sexual activity: Yes    Birth control/protection: None  Lifestyle  . Physical activity:    Days per week: Not on file    Minutes per session: Not on file  . Stress: Not on file  Relationships  . Social connections:    Talks on phone: Not on file    Gets together: Not on file    Attends religious service: Not on file     Active member of club or organization: Not on file    Attends meetings of clubs or organizations: Not on file    Relationship status: Not on file  Other Topics Concern  . Not on file  Social History Narrative  . Not on file    Family History: Family History  Problem Relation Age of Onset  . Hypertension Paternal Grandmother   . Stroke Other   . Coronary artery disease Other     Allergies: Allergies  Allergen Reactions  . Penicillins     Childhood reaction Has patient had a PCN reaction causing immediate rash, facial/tongue/throat swelling, SOB or lightheadedness with hypotension: Unknown Has patient had a PCN reaction causing severe rash involving mucus membranes or skin necrosis: No Has patient had a PCN reaction that required hospitalization: No Has patient had a PCN reaction occurring within the last 10 years: No If all of the above answers are "NO", then may proceed with Cephalosporin use.  . Wellbutrin [  Bupropion]     Pt unsure of reaction.- facial swelling- childhood    Medications Prior to Admission  Medication Sig Dispense Refill Last Dose  . calcium carbonate (TUMS - DOSED IN MG ELEMENTAL CALCIUM) 500 MG chewable tablet Chew 1 tablet by mouth 2 (two) times daily as needed for indigestion or heartburn.   Taking  . Prenatal MV-Min-FA-Omega-3 (PRENATAL GUMMIES/DHA & FA) 0.4-32.5 MG CHEW Chew 1 tablet by mouth 2 (two) times daily.   Taking  . promethazine (PHENERGAN) 25 MG tablet Take 0.5-1 tablets (12.5-25 mg total) by mouth every 6 (six) hours as needed for nausea or vomiting. (Patient not taking: Reported on 11/13/2017) 30 tablet 0 Not Taking     Review of Systems  All systems reviewed and negative except as stated in HPI  Physical Exam Last menstrual period 02/04/2017. General appearance: alert, cooperative and no distress Lungs: clear to auscultation bilaterally Heart: regular rate and rhythm Abdomen: soft, non-tender; bowel sounds normal Extremities: No  calf swelling or tenderness Presentation: cephalic  Fetal monitoring: 120/ moderate/ +accels/ no decels  Uterine activity: occasional mild contractions     Cervical Exam:  Dilation: 3 Effacement (%): Thick Station: -2 Presentation: Vertex  Prenatal labs: ABO, Rh: O/Positive/-- (10/25 1127) Antibody: Negative (03/07 0908) Rubella: <0.90 (10/25 1127) RPR: Non Reactive (03/07 0908)  HBsAg: Negative (10/25 1127)  HIV: Non Reactive (03/07 0908)  GC/Chlamydia: negative (10/14/17) GBS:   negative (10/14/17) 2 hr Glucola: 16-109-60 (09/05/17) Genetic screening:  Declined  Anatomy US: normal female   Clinic [facility]  Initiated Care at  11wk  FOB [name]  Dating By LMP c/w 1st trimester U/ S 7wk  Pap 07/27/14 negative  GC/CT Initial:    -/-            36+wks:-/-  Genetic Screen NT/IT: declined  CF screen declined  Anatomic [place] Normal female  Flu vaccine Given 06/28/17  Tdap Recommended ~ 28wks  Glucose Screen  2 hr normal: 77/113/93  GBS neg  Feed Preference bottle  Contraception nexplanon  Circumcision n/a  Childbirth Classes Info given  Pediatrician [facility]    Prenatal Transfer Tool  Maternal Diabetes: No Genetic Screening: Declined Maternal Ultrasounds/Referrals: Normal Fetal Ultrasounds or other Referrals:  None Maternal Substance Abuse:  No Significant Maternal Medications:  None Significant Maternal Lab Results: Lab values include: Group B Strep negative  No results found for this or any previous visit (from the past 24 hour(s)).  Patient Active Problem List   Diagnosis Date Noted  . Post term pregnancy over 40 weeks 11/18/2017  . Post-term pregnancy, 40-42 weeks of gestation 11/13/2017  . Pregnancy, post-term 11/13/2017  . Rubella non-immune status, antepartum 04/26/2017  . Supervision of normal pregnancy 04/25/2017  . Smoker 08/06/2014    Assessment: Marissa Friedman is a 22 y.o. G3P2002 at [redacted]w[redacted]d here for IOL for postdates pregnancy at [redacted]  weeks gestation   #Labor: Induction of labor Pitocin due to advanced dilation for FB  #Pain: Medications ordered PRN  #FWB: Cat I #ID:  GBS neg  #MOF: Formula  #MOC:Nexplanon  #Circ:  N/A  Sharyon Cable, CNM 11/18/2017, 1:34 AM

## 2017-11-18 NOTE — Progress Notes (Signed)
LABOR PROGRESS NOTE  MAHEK SCHLESINGER is a 22 y.o. [id number] at [redacted]w[redacted]d  admitted for IOL for post-dates  Subjective: Patient dong well. Pain well-controlled with epidural  Objective: BP 113/68   Pulse 90   Temp 98 F (36.7 C) (Oral)   Resp 16   Ht  (1.626 m)   Wt 188 lb (85.3 kg)   LMP 02/04/2017 (Exact Date)   SpO2 98%   BMI 32.27 kg/m  or  Vitals:   11/18/17 1300 11/18/17 1330 11/18/17 1400 11/18/17 1430  BP: (!) 103/57 113/84 118/75 113/68  Pulse: 88 97 74 90  Resp: Temp:      TempSrc:      SpO2:      Weight:      Height:        SVE: Dilation: 6 Effacement (%): 80 Cervical Position: Posterior Station: -2 Presentation: Vertex Exam by:: Kris Hartmann, RN   FHT: baseline rate 120, moderate varibility, +acel, no decel Toco: ctx q 2-3 min   Assessment / Plan: 22 y.o. [id number] at [redacted]w[redacted]d here for IOL for PD  Labor: Progressing well on Pitocin. AROM with clear fluids at 1445 Fetal Wellbeing:  Cat I Pain Control:  epidural Anticipated MOD:  SVD  Frederik Pear, MD 11/18/2017, 2:48 PM

## 2017-11-18 NOTE — Anesthesia Procedure Notes (Signed)
Epidural Patient location during procedure: OB Start time: 11/18/2017 10:03 AM End time: 11/18/2017 10:06 AM  Staffing Anesthesiologist: Beryle Lathe, MD Performed: anesthesiologist   Preanesthetic Checklist Completed: patient identified, pre-op evaluation, timeout performed, IV checked, risks and benefits discussed and monitors and equipment checked  Epidural Patient position: sitting Prep: DuraPrep Patient monitoring: continuous pulse ox and blood pressure Approach: midline Location: L2-L3 Injection technique: LOR saline  Needle:  Needle type: Tuohy  Needle gauge: 17 G Needle length: 9 cm Needle insertion depth: 5 cm Catheter size: 19 Gauge Catheter at skin depth: 10 cm Test dose: negative and Other (1% lidocaine)  Additional Notes Patient identified. Risks including, but not limited to, bleeding, infection, nerve damage, paralysis, inadequate analgesia, blood pressure changes, nausea, vomiting, allergic reaction, postpartum back pain, itching, and headache were discussed. Patient expressed understanding and wished to proceed. Sterile prep and drape, including hand hygiene, mask, and sterile gloves were used. The patient was positioned and the spine was prepped. The skin was anesthetized with lidocaine. No paraesthesia or other complication noted. The patient did not experience any signs of intravascular injection such as tinnitus or metallic taste in mouth, nor signs of intrathecal spread such as rapid motor block. Please see nursing notes for vital signs. The patient tolerated the procedure well.   [name], MDReason for block:procedure for pain

## 2017-11-18 NOTE — Anesthesia Preprocedure Evaluation (Signed)
Anesthesia Evaluation  Patient identified by MRN, date of birth, ID band Patient awake    Reviewed: Allergy & Precautions, NPO status , Patient's Chart, lab work & pertinent test results  Airway Mallampati: II  TM Distance: >3 FB Neck ROM: Full    Dental  (+) Dental Advisory Given   Pulmonary former smoker,    breath sounds clear to auscultation       Cardiovascular negative cardio ROS   Rhythm:Regular Rate:Normal     Neuro/Psych Anxiety negative neurological ROS     GI/Hepatic negative GI ROS, Neg liver ROS,   Endo/Other  Obesity  Renal/GU negative Renal ROS  negative genitourinary   Musculoskeletal negative musculoskeletal ROS (+)   Abdominal   Peds  Hematology Thrombocytopenia   Anesthesia Other Findings   Reproductive/Obstetrics (+) Pregnancy                             Anesthesia Physical Anesthesia Plan  ASA: II  Anesthesia Plan: Epidural   Post-op Pain Management:    Induction:   PONV Risk Score and Plan:   Airway Management Planned: Natural Airway  Additional Equipment:   Intra-op Plan:   Post-operative Plan:   Informed Consent: I have reviewed the patients History and Physical, chart, labs and discussed the procedure including the risks, benefits and alternatives for the proposed anesthesia with the patient or authorized representative who has indicated his/her understanding and acceptance.     Plan Discussed with:   Anesthesia Plan Comments: (Labs reviewed. Platelets acceptable, patient not taking any blood thinning medications. Risks and benefits discussed with patient, patient expressed understanding and wished to proceed.)        Anesthesia Quick Evaluation

## 2017-11-19 ENCOUNTER — Encounter: Payer: Self-pay | Admitting: Obstetrics & Gynecology

## 2017-11-19 ENCOUNTER — Other Ambulatory Visit: Payer: Self-pay

## 2017-11-19 MED ORDER — MENTHOL 3 MG MT LOZG
1.0000 | LOZENGE | OROMUCOSAL | Status: DC | PRN
  Administered 2017-11-19: 3 mg via ORAL
  Filled 2017-11-19: qty 9

## 2017-11-19 NOTE — Progress Notes (Addendum)
POSTPARTUM PROGRESS NOTE  Post Partum Day 1  Subjective:  Marissa Friedman is a 22 y.o. [phone] s/p SVD at [redacted]w[redacted]d.  She reports she is doing well. No acute events overnight. She denies any problems with ambulating, voiding or po intake. Denies nausea or vomiting.  Pain is well controlled.  Lochia is nml.  Baby is having issues with spitting up.  Mom would like to stay another day with baby.  Objective: Blood pressure (!) 109/55, pulse 79, temperature 98.6 F (37 C), temperature source Oral, resp. rate 18, height  (1.626 m), weight 188 lb (85.3 kg), last menstrual period 02/04/2017, SpO2 98 %, unknown if currently breastfeeding.  Physical Exam:  General: alert, cooperative and no distress Chest: no respiratory distress Heart:regular rate, distal pulses intact Abdomen: soft, nontender,  Uterine Fundus: firm, appropriately tender DVT Evaluation: No calf swelling or tenderness Extremities: no edema Skin: warm, dry  Recent Labs    11/18/17 0100  HGB 11.5*  HCT 33.1*    Assessment/Plan: Marissa Friedman is a 21 y.o. [phone] s/p SVD at [redacted]w[redacted]d   PPD#1 - Doing well  Routine postpartum care  Contraception: Nexplanon Feeding: Bottle Dispo: Plan for discharge 11/20/2017.   LOS: 1 day   Alfonso Ellis Medical Student 11/19/2017, 7:52 AM   I confirm that I have verified the information documented in the medical student's note and that I have also personally reperformed the physical exam and all medical decision making activities.   Thressa Sheller 10:19 AM 11/19/17

## 2017-11-19 NOTE — Progress Notes (Signed)
MOB was referred for history of depression/anxiety. * Referral screened out by Clinical Social Worker because none of the following criteria appear to apply: ~ History of anxiety/depression during this pregnancy, or of post-partum depression. ~ Diagnosis of anxiety and/or depression within last 3 years OR * MOB's symptoms currently being treated with medication and/or therapy. Please contact the Clinical Social Worker if needs arise, by MOB request, or if MOB scores greater than 9/yes to question 10 on Edinburgh Postpartum Depression Screen.  PNR states "doing well now-no meds." 

## 2017-11-19 NOTE — Anesthesia Postprocedure Evaluation (Signed)
Anesthesia Post Note  Patient: [place]  Procedure(s) Performed: AN AD HOC LABOR EPIDURAL     Patient location during evaluation: Mother Baby Anesthesia Type: Epidural Level of consciousness: awake and alert and oriented Pain management: satisfactory to patient Vital Signs Assessment: post-procedure vital signs reviewed and stable Respiratory status: spontaneous breathing and nonlabored ventilation Cardiovascular status: stable Postop Assessment: no headache, no backache, no signs of nausea or vomiting, adequate PO intake, patient able to bend at knees and able to ambulate (patient up walking) Anesthetic complications: no    Last Vitals:  Vitals:   11/18/17 2100 11/19/17 0111  BP: 110/71 (!) 109/55  Pulse: 87 79  Resp: 16 18  Temp: 37.3 C 37 C  SpO2:      Last Pain:  Vitals:   11/19/17 0500  TempSrc:   PainSc: 0-No pain   Pain Goal:                 Marthena Whitmyer

## 2017-11-20 MED ORDER — IBUPROFEN 600 MG PO TABS: 600.0000 mg | ORAL_TABLET | Freq: Four times a day (QID) | ORAL | 0 refills | Status: DC | PRN

## 2017-11-20 NOTE — Discharge Summary (Signed)
OB Discharge Summary     Patient Name: Marissa Friedman DOB: Nov 25, 1995 MRN: [id number]  Date of admission: 11/18/2017 Delivering MD: Donette Larry   Date of discharge: 11/20/2017  Admitting diagnosis: INDUCTION Intrauterine pregnancy: [redacted]w[redacted]d     Secondary diagnosis:  Active Problems:   Post term pregnancy over 40 weeks  Additional problems: none     Discharge diagnosis: Term Pregnancy Delivered                                                                                                Post partum procedures:none  Augmentation: AROM and Pitocin  Complications: None  Hospital course:  Induction of Labor With Vaginal Delivery   22 y.o. yo G3P3003 at [redacted]w[redacted]d was admitted to the hospital 11/18/2017 for induction of labor.  Indication for induction: Postdates.  Patient had an uncomplicated labor course as follows: Membrane Rupture Time/Date: 2:45 PM ,11/18/2017   Intrapartum Procedures: Episiotomy: None [1]                                         Lacerations:  None [1]  Patient had delivery of a Viable infant.  Information for the patient's newborn:  Cassondra, Stachowski [[id number]]  Delivery Method: Vaginal, Spontaneous(Filed from Delivery Summary)   11/18/2017  Details of delivery can be found in separate delivery note.  Patient had a routine postpartum course. Patient is discharged home 11/20/17.  Physical exam  Vitals:   11/19/17 0111 11/19/17 0830 11/19/17 1817 11/20/17 0624  BP: (!) 109/55  124/74 130/74  Pulse: 79 78 64 75  Resp: Temp: 98.6 F (37 C) 97.6 F (36.4 C) 97.7 F (36.5 C) 97.7 F (36.5 C)  TempSrc: Oral Oral Oral Oral  SpO2:   100%   Weight:      Height:       General: alert, cooperative and no distress Lochia: appropriate Uterine Fundus: firm Incision: N/A DVT Evaluation: No evidence of DVT seen on physical exam. Negative Homan's sign. No cords or calf tenderness. No significant calf/ankle edema. Labs: Lab Results   Component Value Date   WBC 8.8 11/18/2017   HGB 11.5 (L) 11/18/2017   HCT 33.1 (L) 11/18/2017   MCV 89.0 11/18/2017   PLT 139 (L) 11/18/2017   CMP Latest Ref Rng & Units 07/12/2014  Glucose 70 - 99 mg/dL 782(N)  BUN 6 - 23 mg/dL 11  Creatinine 5.62 - 1.30 mg/dL 8.65  Sodium 784 - 696 mmol/L 139  Potassium 3.5 - 5.1 mmol/L 4.0  Chloride 96 - 112 mEq/L 107  CO2 19 - 32 mmol/L 24  Calcium 8.4 - 10.5 mg/dL 9.1  Total Protein 6.0 - 8.3 g/dL 7.6  Total Bilirubin 0.3 - 1.2 mg/dL 0.5  Alkaline Phos 39 - 117 U/L 78  AST 0 - 37 U/L 21  ALT 0 - 35 U/L 18    Discharge instruction: per After Visit Summary and "Baby and Me Booklet".  After visit meds:  Allergies  as of 11/20/2017      Reactions   Penicillins    Childhood reaction Has patient had a PCN reaction causing immediate rash, facial/tongue/throat swelling, SOB or lightheadedness with hypotension: Unknown Has patient had a PCN reaction causing severe rash involving mucus membranes or skin necrosis: No Has patient had a PCN reaction that required hospitalization: No Has patient had a PCN reaction occurring within the last 10 years: No If all of the above answers are "NO", then may proceed with Cephalosporin use.   Wellbutrin [bupropion]    Pt unsure of reaction.- facial swelling- childhood      Medication List    STOP taking these medications   calcium carbonate 500 MG chewable tablet Commonly known as:  TUMS - dosed in mg elemental calcium   PRENATAL GUMMIES/DHA & FA 0.4-32.5 MG Chew   promethazine 25 MG tablet Commonly known as:  PHENERGAN     TAKE these medications   ibuprofen 600 MG tablet Commonly known as:  ADVIL,MOTRIN Take 1 tablet (600 mg total) by mouth every 6 (six) hours as needed for mild pain, moderate pain or cramping.       Diet: routine diet  Activity: Advance as tolerated. Pelvic rest for 6 weeks.   Outpatient follow up:4-6wks Follow up Appt: Future Appointments  Date Time Provider Department  Center  12/24/2017 10:00 AM Cheral Marker, CNM [facility]   Follow up Visit:No follow-ups on file.  Postpartum contraception: abstinence until nexplanon  Newborn Data: Live born female  Birth Weight: 8 lb (3630 g) APGAR: 9, 9  Newborn Delivery   Birth date/time:  11/18/2017 17:26:00 Delivery type:  Vaginal, Spontaneous     Baby Feeding: Bottle Disposition:home with mother   11/20/2017 Cheral Marker, CNM

## 2017-11-20 NOTE — Discharge Instructions (Signed)
NO SEX UNTIL AFTER YOU GET YOUR BIRTH CONTROL  ° °Postpartum Care After Vaginal Delivery °The period of time right after you deliver your newborn is called the postpartum period. °What kind of medical care will I receive? °· You may continue to receive fluids and medicines through an IV tube inserted into one of your veins. °· If an incision was made near your vagina (episiotomy) or if you had some vaginal tearing during delivery, cold compresses may be placed on your episiotomy or your tear. This helps to reduce pain and swelling. °· You may be given a squirt bottle to use when you go to the bathroom. You may use this until you are comfortable wiping as usual. To use the squirt bottle, follow these steps: °? Before you urinate, fill the squirt bottle with warm water. Do not use hot water. °? After you urinate, while you are sitting on the toilet, use the squirt bottle to rinse the area around your urethra and vaginal opening. This rinses away any urine and blood. °? You may do this instead of wiping. As you start healing, you may use the squirt bottle before wiping yourself. Make sure to wipe gently. °? Fill the squirt bottle with clean water every time you use the bathroom. °· You will be given sanitary pads to wear. °How can I expect to feel? °· You may not feel the need to urinate for several hours after delivery. °· You will have some soreness and pain in your abdomen and vagina. °· If you are breastfeeding, you may have uterine contractions every time you breastfeed for up to several weeks postpartum. Uterine contractions help your uterus return to its normal size. °· It is normal to have vaginal bleeding (lochia) after delivery. The amount and appearance of lochia is often similar to a menstrual period in the first week after delivery. It will gradually decrease over the next few weeks to a dry, yellow-brown discharge. For most women, lochia stops completely by 6-8 weeks after delivery. Vaginal bleeding can  vary from woman to woman. °· Within the first few days after delivery, you may have breast engorgement. This is when your breasts feel heavy, full, and uncomfortable. Your breasts may also throb and feel hard, tightly stretched, warm, and tender. After this occurs, you may have milk leaking from your breasts. Your health care provider can help you relieve discomfort due to breast engorgement. Breast engorgement should go away within a few days. °· You may feel more sad or worried than normal due to hormonal changes after delivery. These feelings should not last more than a few days. If these feelings do not go away after several days, speak with your health care provider. °How should I care for myself? °· Tell your health care provider if you have pain or discomfort. °· Drink enough water to keep your urine clear or pale yellow. °· Wash your hands thoroughly with soap and water for at least 20 seconds after changing your sanitary pads, after using the toilet, and before holding or feeding your baby. °· If you are not breastfeeding, avoid touching your breasts a lot. Doing this can make your breasts produce more milk. °· If you become weak or lightheaded, or you feel like you might faint, ask for help before: °? Getting out of bed. °? Showering. °· Change your sanitary pads frequently. Watch for any changes in your flow, such as a sudden increase in volume, a change in color, the passing of large blood clots.   clots. If you pass a blood clot from your vagina, save it to show to your health care provider. Do not flush blood clots down the toilet without having your health care provider look at them.  Make sure that all your vaccinations are up to date. This can help protect you and your baby from getting certain diseases. You may need to have immunizations done before you leave the hospital.  If desired, talk with your health care provider about methods of family planning or birth control (contraception). How can I start  bonding with my baby? Spending as much time as possible with your baby is very important. During this time, you and your baby can get to know each other and develop a bond. Having your baby stay with you in your room (rooming in) can give you time to get to know your baby. Rooming in can also help you become comfortable caring for your baby. Breastfeeding can also help you bond with your baby. How can I plan for returning home with my baby?  Make sure that you have a car seat installed in your vehicle. ? Your car seat should be checked by a certified car seat installer to make sure that it is installed safely. ? Make sure that your baby fits into the car seat safely.  Ask your health care provider any questions you have about caring for yourself or your baby. Make sure that you are able to contact your health care provider with any questions after leaving the hospital. This information is not intended to replace advice given to you by your health care provider. Make sure you discuss any questions you have with your health care provider. Document Released: 04/15/2007 Document Revised: 11/21/2015 Document Reviewed: 05/23/2015 Elsevier Interactive Patient Education  2018 [employer].   Breastfeeding Choosing to breastfeed is one of the best decisions you can make for yourself and your baby. A change in hormones during pregnancy causes your breasts to make breast milk in your milk-producing glands. Hormones prevent breast milk from being released before your baby is born. They also prompt milk flow after birth. Once breastfeeding has begun, thoughts of your baby, as well as his or her sucking or crying, can stimulate the release of milk from your milk-producing glands. Benefits of breastfeeding Research shows that breastfeeding offers many health benefits for infants and mothers. It also offers a cost-free and convenient way to feed your baby. For your baby  Your first milk (colostrum) helps your  baby's digestive system to function better.  Special cells in your milk (antibodies) help your baby to fight off infections.  Breastfed babies are less likely to develop asthma, allergies, obesity, or type 2 diabetes. They are also at lower risk for sudden infant death syndrome (SIDS).  Nutrients in breast milk are better able to meet your babys needs compared to infant formula.  Breast milk improves your baby's brain development. For you  Breastfeeding helps to create a very special bond between you and your baby.  Breastfeeding is convenient. Breast milk costs nothing and is always available at the correct temperature.  Breastfeeding helps to burn calories. It helps you to lose the weight that you gained during pregnancy.  Breastfeeding makes your uterus return faster to its size before pregnancy. It also slows bleeding (lochia) after you give birth.  Breastfeeding helps to lower your risk of developing type 2 diabetes, osteoporosis, rheumatoid arthritis, cardiovascular disease, and breast, ovarian, uterine, and endometrial cancer later in life. Breastfeeding basics  Starting breastfeeding  Find a comfortable place to sit or lie down, with your neck and back well-supported.  Place a pillow or a rolled-up blanket under your baby to bring him or her to the level of your breast (if you are seated). Nursing pillows are specially designed to help support your arms and your baby while you breastfeed.  Make sure that your baby's tummy (abdomen) is facing your abdomen.  Gently massage your breast. With your fingertips, massage from the outer edges of your breast inward toward the nipple. This encourages milk flow. If your milk flows slowly, you may need to continue this action during the feeding.  Support your breast with 4 fingers underneath and your thumb above your nipple (make the letter "C" with your hand). Make sure your fingers are well away from your nipple and your babys  mouth.  Stroke your baby's lips gently with your finger or nipple.  When your baby's mouth is open wide enough, quickly bring your baby to your breast, placing your entire nipple and as much of the areola as possible into your baby's mouth. The areola is the colored area around your nipple. ? More areola should be visible above your baby's upper lip than below the lower lip. ? Your baby's lips should be opened and extended outward (flanged) to ensure an adequate, comfortable latch. ? Your baby's tongue should be between his or her lower gum and your breast.  Make sure that your baby's mouth is correctly positioned around your nipple (latched). Your baby's lips should create a seal on your breast and be turned out (everted).  It is common for your baby to suck about 2-3 minutes in order to start the flow of breast milk. Latching Teaching your baby how to latch onto your breast properly is very important. An improper latch can cause nipple pain, decreased milk supply, and poor weight gain in your baby. Also, if your baby is not latched onto your nipple properly, he or she may swallow some air during feeding. This can make your baby fussy. Burping your baby when you switch breasts during the feeding can help to get rid of the air. However, teaching your baby to latch on properly is still the best way to prevent fussiness from swallowing air while breastfeeding. Signs that your baby has successfully latched onto your nipple  Silent tugging or silent sucking, without causing you pain. Infant's lips should be extended outward (flanged).  Swallowing heard between every 3-4 sucks once your milk has started to flow (after your let-down milk reflex occurs).  Muscle movement above and in front of his or her ears while sucking.  Signs that your baby has not successfully latched onto your nipple  Sucking sounds or smacking sounds from your baby while breastfeeding.  Nipple pain.  If you think your  baby has not latched on correctly, slip your finger into the corner of your babys mouth to break the suction and place it between your baby's gums. Attempt to start breastfeeding again. Signs of successful breastfeeding Signs from your baby  Your baby will gradually decrease the number of sucks or will completely stop sucking.  Your baby will fall asleep.  Your baby's body will relax.  Your baby will retain a small amount of milk in his or her mouth.  Your baby will let go of your breast by himself or herself.  Signs from you  Breasts that have increased in firmness, weight, and size 1-3 hours after feeding.  Breasts that are softer immediately after breastfeeding.  Increased milk volume, as well as a change in milk consistency and color by the fifth day of breastfeeding.  Nipples that are not sore, cracked, or bleeding.  Signs that your baby is getting enough milk  Wetting at least 1-2 diapers during the first 24 hours after birth.  Wetting at least 5-6 diapers every 24 hours for the first week after birth. The urine should be clear or pale yellow by the age of 5 days.  Wetting 6-8 diapers every 24 hours as your baby continues to grow and develop.  At least 3 stools in a 24-hour period by the age of 5 days. The stool should be soft and yellow.  At least 3 stools in a 24-hour period by the age of 7 days. The stool should be seedy and yellow.  No loss of weight greater than 10% of birth weight during the first 3 days of life.  Average weight gain of 4-7 oz (113-198 g) per week after the age of 4 days.  Consistent daily weight gain by the age of 5 days, without weight loss after the age of 2 weeks. After a feeding, your baby may spit up a small amount of milk. This is normal. Breastfeeding frequency and duration Frequent feeding will help you make more milk and can prevent sore nipples and extremely full breasts (breast engorgement). Breastfeed when you feel the need to  reduce the fullness of your breasts or when your baby shows signs of hunger. This is called "breastfeeding on demand." Signs that your baby is hungry include:  Increased alertness, activity, or restlessness.  Movement of the head from side to side.  Opening of the mouth when the corner of the mouth or cheek is stroked (rooting).  Increased sucking sounds, smacking lips, cooing, sighing, or squeaking.  Hand-to-mouth movements and sucking on fingers or hands.  Fussing or crying.  Avoid introducing a pacifier to your baby in the first 4-6 weeks after your baby is born. After this time, you may choose to use a pacifier. Research has shown that pacifier use during the first year of a baby's life decreases the risk of sudden infant death syndrome (SIDS). Allow your baby to feed on each breast as long as he or she wants. When your baby unlatches or falls asleep while feeding from the first breast, offer the second breast. Because newborns are often sleepy in the first few weeks of life, you may need to awaken your baby to get him or her to feed. Breastfeeding times will vary from baby to baby. However, the following rules can serve as a guide to help you make sure that your baby is properly fed:  Newborns (babies 81 weeks of age or younger) may breastfeed every 1-3 hours.  Newborns should not go without breastfeeding for longer than 3 hours during the day or 5 hours during the night.  You should breastfeed your baby a minimum of 8 times in a 24-hour period.  Breast milk pumping Pumping and storing breast milk allows you to make sure that your baby is exclusively fed your breast milk, even at times when you are unable to breastfeed. This is especially important if you go back to work while you are still breastfeeding, or if you are not able to be present during feedings. Your lactation consultant can help you find a method of pumping that works best for you and give you guidelines about how long it  is  safe to store breast milk. Caring for your breasts while you breastfeed Nipples can become dry, cracked, and sore while breastfeeding. The following recommendations can help keep your breasts moisturized and healthy:  Avoid using soap on your nipples.  Wear a supportive bra designed especially for nursing. Avoid wearing underwire-style bras or extremely tight bras (sports bras).  Air-dry your nipples for 3-4 minutes after each feeding.  Use only cotton bra pads to absorb leaked breast milk. Leaking of breast milk between feedings is normal.  Use lanolin on your nipples after breastfeeding. Lanolin helps to maintain your skin's normal moisture barrier. Pure lanolin is not harmful (not toxic) to your baby. You may also hand express a few drops of breast milk and gently massage that milk into your nipples and allow the milk to air-dry.  In the first few weeks after giving birth, some women experience breast engorgement. Engorgement can make your breasts feel heavy, warm, and tender to the touch. Engorgement peaks within 3-5 days after you give birth. The following recommendations can help to ease engorgement:  Completely empty your breasts while breastfeeding or pumping. You may want to start by applying warm, moist heat (in the shower or with warm, water-soaked hand towels) just before feeding or pumping. This increases circulation and helps the milk flow. If your baby does not completely empty your breasts while breastfeeding, pump any extra milk after he or she is finished.  Apply ice packs to your breasts immediately after breastfeeding or pumping, unless this is too uncomfortable for you. To do this: ? Put ice in a plastic bag. ? Place a towel between your skin and the bag. ? Leave the ice on for 20 minutes, 2-3 times a day.  Make sure that your baby is latched on and positioned properly while breastfeeding.  If engorgement persists after 48 hours of following these recommendations,  contact your health care provider or a Advertising copywriter. Overall health care recommendations while breastfeeding  Eat 3 healthy meals and 3 snacks every day. Well-nourished mothers who are breastfeeding need an additional 450-500 calories a day. You can meet this requirement by increasing the amount of a balanced diet that you eat.  Drink enough water to keep your urine pale yellow or clear.  Rest often, relax, and continue to take your prenatal vitamins to prevent fatigue, stress, and low vitamin and mineral levels in your body (nutrient deficiencies).  Do not use any products that contain nicotine or tobacco, such as cigarettes and e-cigarettes. Your baby may be harmed by chemicals from cigarettes that pass into breast milk and exposure to secondhand smoke. If you need help quitting, ask your health care provider.  Avoid alcohol.  Do not use illegal drugs or marijuana.  Talk with your health care provider before taking any medicines. These include over-the-counter and prescription medicines as well as vitamins and herbal supplements. Some medicines that may be harmful to your baby can pass through breast milk.  It is possible to become pregnant while breastfeeding. If birth control is desired, ask your health care provider about options that will be safe while breastfeeding your baby. Where to find more information: [employer]: www.llli.org Contact a health care provider if:  You feel like you want to stop breastfeeding or have become frustrated with breastfeeding.  Your nipples are cracked or bleeding.  Your breasts are red, tender, or warm.  You have: ? Painful breasts or nipples. ? A swollen area on either breast. ? A fever  or chills. ? Nausea or vomiting. ? Drainage other than breast milk from your nipples.  Your breasts do not become full before feedings by the fifth day after you give birth.  You feel sad and depressed.  Your baby is: ? Too  sleepy to eat well. ? Having trouble sleeping. ? More than 55 week old and wetting fewer than 6 diapers in a 24-hour period. ? Not gaining weight by 17 days of age.  Your baby has fewer than 3 stools in a 24-hour period.  Your baby's skin or the white parts of his or her eyes become yellow. Get help right away if:  Your baby is overly tired (lethargic) and does not want to wake up and feed.  Your baby develops an unexplained fever. Summary  Breastfeeding offers many health benefits for infant and mothers.  Try to breastfeed your infant when he or she shows early signs of hunger.  Gently tickle or stroke your baby's lips with your finger or nipple to allow the baby to open his or her mouth. Bring the baby to your breast. Make sure that much of the areola is in your baby's mouth. Offer one side and burp the baby before you offer the other side.  Talk with your health care provider or lactation consultant if you have questions or you face problems as you breastfeed. This information is not intended to replace advice given to you by your health care provider. Make sure you discuss any questions you have with your health care provider. Document Released: 06/18/2005 Document Revised: 07/20/2016 Document Reviewed: 07/20/2016 Elsevier Interactive Patient Education  [employer].

## 2017-12-19 ENCOUNTER — Ambulatory Visit: Payer: Medicaid Other | Admitting: Advanced Practice Midwife

## 2017-12-24 ENCOUNTER — Ambulatory Visit: Payer: Medicaid Other | Admitting: Women's Health

## 2018-01-01 ENCOUNTER — Encounter: Payer: Self-pay | Admitting: *Deleted

## 2018-01-01 ENCOUNTER — Ambulatory Visit: Payer: Medicaid Other | Admitting: Advanced Practice Midwife

## 2018-06-13 ENCOUNTER — Ambulatory Visit: Payer: Medicaid Other | Admitting: Adult Health

## 2018-06-23 ENCOUNTER — Encounter: Payer: Self-pay | Admitting: *Deleted

## 2018-06-23 ENCOUNTER — Ambulatory Visit: Payer: Self-pay | Admitting: Adult Health

## 2018-07-02 NOTE — L&D Delivery Note (Signed)
[facility] Delivery Note  Marissa Friedman is a 23 y.o. now [id number] s/p SVD at [redacted]w[redacted]d who was admitted for SOL.   ROM: 2h 30m with clear fluid GBS Status: Negative Maximum Maternal Temperature: 98.3  Delivery Note At 3:50 AM a viable female was delivered via Vaginal, Spontaneous (Presentation: LOA). Head delivered in normal fashion. Tight nuchal cord that could not be reduced on the perineum. Body delivered without difficulty; baby somersaulted out of cord. APGAR: 8, 9; weight pending.   Placenta status:spontaneous, intact via Delena Bali.  Cord: 3VC  with no complications. Cord pH: n/a  Anesthesia: epidural Episiotomy: None Lacerations: None Suture Repair: n/a Est. Blood Loss (mL): 100   Postpartum Planning [x]  Mom to postpartum.  Baby to Couplet care / Skin to Skin.  [x]  plans to bottle feed [x]  message to sent to schedule follow-up  [x]  vaccines UTD  Laury Deep, MSN, CNM 02/10/19, 4:20 AM

## 2018-07-07 ENCOUNTER — Encounter: Payer: Self-pay | Admitting: Adult Health

## 2018-07-07 ENCOUNTER — Ambulatory Visit: Payer: Medicaid Other | Admitting: Adult Health

## 2018-07-07 VITALS — BP 107/61 | HR 88 | Ht 64.0 in | Wt 186.0 lb

## 2018-07-07 DIAGNOSIS — Z3201 Encounter for pregnancy test, result positive: Secondary | ICD-10-CM | POA: Diagnosis not present

## 2018-07-07 DIAGNOSIS — Z3A11 11 weeks gestation of pregnancy: Secondary | ICD-10-CM | POA: Insufficient documentation

## 2018-07-07 DIAGNOSIS — Z0000 Encounter for general adult medical examination without abnormal findings: Secondary | ICD-10-CM | POA: Insufficient documentation

## 2018-07-07 DIAGNOSIS — N926 Irregular menstruation, unspecified: Secondary | ICD-10-CM

## 2018-07-07 DIAGNOSIS — O09891 Supervision of other high risk pregnancies, first trimester: Secondary | ICD-10-CM

## 2018-07-07 DIAGNOSIS — O3680X0 Pregnancy with inconclusive fetal viability, not applicable or unspecified: Secondary | ICD-10-CM

## 2018-07-07 LAB — POCT URINE PREGNANCY: PREG TEST UR: POSITIVE — AB

## 2018-07-07 MED ORDER — PRENATAL PLUS 27-1 MG PO TABS: 1.0000 | ORAL_TABLET | Freq: Every day | ORAL | 12 refills | Status: DC

## 2018-07-07 NOTE — Patient Instructions (Signed)
Second Trimester of Pregnancy  The second trimester is from week 14 through week 27 (month 4 through 6). This is often the time in pregnancy that you feel your best. Often times, morning sickness has lessened or quit. You may have more energy, and you may get hungry more often. Your unborn baby is growing rapidly. At the end of the sixth month, he or she is about 9 inches long and weighs about 1 pounds. You will likely feel the baby move between 18 and 20 weeks of pregnancy. Follow these instructions at home: Medicines  Take over-the-counter and prescription medicines only as told by your doctor. Some medicines are safe and some medicines are not safe during pregnancy.  Take a prenatal vitamin that contains at least 600 micrograms (mcg) of folic acid.  If you have trouble pooping (constipation), take medicine that will make your stool soft (stool softener) if your doctor approves. Eating and drinking   Eat regular, healthy meals.  Avoid raw meat and uncooked cheese.  If you get low calcium from the food you eat, talk to your doctor about taking a daily calcium supplement.  Avoid foods that are high in fat and sugars, such as fried and sweet foods.  If you feel sick to your stomach (nauseous) or throw up (vomit): ? Eat 4 or 5 small meals a day instead of 3 large meals. ? Try eating a few soda crackers. ? Drink liquids between meals instead of during meals.  To prevent constipation: ? Eat foods that are high in fiber, like fresh fruits and vegetables, whole grains, and beans. ? Drink enough fluids to keep your pee (urine) clear or pale yellow. Activity  Exercise only as told by your doctor. Stop exercising if you start to have cramps.  Do not exercise if it is too hot, too humid, or if you are in a place of great height (high altitude).  Avoid heavy lifting.  Wear low-heeled shoes. Sit and stand up straight.  You can continue to have sex unless your doctor tells you not to.  Relieving pain and discomfort  Wear a good support bra if your breasts are tender.  Take warm water baths (sitz baths) to soothe pain or discomfort caused by hemorrhoids. Use hemorrhoid cream if your doctor approves.  Rest with your legs raised if you have leg cramps or low back pain.  If you develop puffy, bulging veins (varicose veins) in your legs: ? Wear support hose or compression stockings as told by your doctor. ? Raise (elevate) your feet for 15 minutes, 3-4 times a day. ? Limit salt in your food. Prenatal care  Write down your questions. Take them to your prenatal visits.  Keep all your prenatal visits as told by your doctor. This is important. Safety  Wear your seat belt when driving.  Make a list of emergency phone numbers, including numbers for family, friends, the hospital, and police and fire departments. General instructions  Ask your doctor about the right foods to eat or for help finding a counselor, if you need these services.  Ask your doctor about local prenatal classes. Begin classes before month 6 of your pregnancy.  Do not use hot tubs, steam rooms, or saunas.  Do not douche or use tampons or scented sanitary pads.  Do not cross your legs for long periods of time.  Visit your dentist if you have not done so. Use a soft toothbrush to brush your teeth. Floss gently.  Avoid all smoking, herbs,   and alcohol. Avoid drugs that are not approved by your doctor.  Do not use any products that contain nicotine or tobacco, such as cigarettes and e-cigarettes. If you need help quitting, ask your doctor.  Avoid cat litter boxes and soil used by cats. These carry germs that can cause birth defects in the baby and can cause a loss of your baby (miscarriage) or stillbirth. Contact a doctor if:  You have mild cramps or pressure in your lower belly.  You have pain when you pee (urinate).  You have bad smelling fluid coming from your vagina.  You continue to feel  sick to your stomach (nauseous), throw up (vomit), or have watery poop (diarrhea).  You have a nagging pain in your belly area.  You feel dizzy. Get help right away if:  You have a fever.  You are leaking fluid from your vagina.  You have spotting or bleeding from your vagina.  You have severe belly cramping or pain.  You lose or gain weight rapidly.  You have trouble catching your breath and have chest pain.  You notice sudden or extreme puffiness (swelling) of your face, hands, ankles, feet, or legs.  You have not felt the baby move in over an hour.  You have severe headaches that do not go away when you take medicine.  You have trouble seeing. Summary  The second trimester is from week 14 through week 27 (months 4 through 6). This is often the time in pregnancy that you feel your best.  To take care of yourself and your unborn baby, you will need to eat healthy meals, take medicines only if your doctor tells you to do so, and do activities that are safe for you and your baby.  Call your doctor if you get sick or if you notice anything unusual about your pregnancy. Also, call your doctor if you need help with the right food to eat, or if you want to know what activities are safe for you. This information is not intended to replace advice given to you by your health care provider. Make sure you discuss any questions you have with your health care provider. Document Released: 09/12/2009 Document Revised: 07/24/2016 Document Reviewed: 07/24/2016 Elsevier Interactive Patient Education  2019 [employer] First Trimester of Pregnancy The first trimester of pregnancy is from week 1 until the end of week 13 (months 1 through 3). A week after a sperm fertilizes an egg, the egg will implant on the wall of the uterus. This embryo will begin to develop into a baby. Genes from you and your partner will form the baby. The female genes will determine whether the baby will be a boy or a girl.  At 6-8 weeks, the eyes and face will be formed, and the heartbeat can be seen on ultrasound. At the end of 12 weeks, all the baby's organs will be formed. Now that you are pregnant, you will want to do everything you can to have a healthy baby. Two of the most important things are to get good prenatal care and to follow your health care provider's instructions. Prenatal care is all the medical care you receive before the baby's birth. This care will help prevent, find, and treat any problems during the pregnancy and childbirth. Body changes during your first trimester Your body goes through many changes during pregnancy. The changes vary from woman to woman.  You may gain or lose a couple of pounds at first.  You may feel sick   to your stomach (nauseous) and you may throw up (vomit). If the vomiting is uncontrollable, call your health care provider.  You may tire easily.  You may develop headaches that can be relieved by medicines. All medicines should be approved by your health care provider.  You may urinate more often. Painful urination may mean you have a bladder infection.  You may develop heartburn as a result of your pregnancy.  You may develop constipation because certain hormones are causing the muscles that push stool through your intestines to slow down.  You may develop hemorrhoids or swollen veins (varicose veins).  Your breasts may begin to grow larger and become tender. Your nipples may stick out more, and the tissue that surrounds them (areola) may become darker.  Your gums may bleed and may be sensitive to brushing and flossing.  Dark spots or blotches (chloasma, mask of pregnancy) may develop on your face. This will likely fade after the baby is born.  Your menstrual periods will stop.  You may have a loss of appetite.  You may develop cravings for certain kinds of food.  You may have changes in your emotions from day to day, such as being excited to be pregnant or  being concerned that something may go wrong with the pregnancy and baby.  You may have more vivid and strange dreams.  You may have changes in your hair. These can include thickening of your hair, rapid growth, and changes in texture. Some women also have hair loss during or after pregnancy, or hair that feels dry or thin. Your hair will most likely return to normal after your baby is born. What to expect at prenatal visits During a routine prenatal visit:  You will be weighed to make sure you and the baby are growing normally.  Your blood pressure will be taken.  Your abdomen will be measured to track your baby's growth.  The fetal heartbeat will be listened to between weeks 10 and 14 of your pregnancy.  Test results from any previous visits will be discussed. Your health care provider may ask you:  How you are feeling.  If you are feeling the baby move.  If you have had any abnormal symptoms, such as leaking fluid, bleeding, severe headaches, or abdominal cramping.  If you are using any tobacco products, including cigarettes, chewing tobacco, and electronic cigarettes.  If you have any questions. Other tests that may be performed during your first trimester include:  Blood tests to find your blood type and to check for the presence of any previous infections. The tests will also be used to check for low iron levels (anemia) and protein on red blood cells (Rh antibodies). Depending on your risk factors, or if you previously had diabetes during pregnancy, you may have tests to check for high blood sugar that affects pregnant women (gestational diabetes).  Urine tests to check for infections, diabetes, or protein in the urine.  An ultrasound to confirm the proper growth and development of the baby.  Fetal screens for spinal cord problems (spina bifida) and Down syndrome.  HIV (human immunodeficiency virus) testing. Routine prenatal testing includes screening for HIV, unless you  choose not to have this test.  You may need other tests to make sure you and the baby are doing well. Follow these instructions at home: Medicines  Follow your health care provider's instructions regarding medicine use. Specific medicines may be either safe or unsafe to take during pregnancy.  Take a prenatal vitamin   that contains at least 600 micrograms (mcg) of folic acid.  If you develop constipation, try taking a stool softener if your health care provider approves. Eating and drinking   Eat a balanced diet that includes fresh fruits and vegetables, whole grains, good sources of protein such as meat, eggs, or tofu, and low-fat dairy. Your health care provider will help you determine the amount of weight gain that is right for you.  Avoid raw meat and uncooked cheese. These carry germs that can cause birth defects in the baby.  Eating four or five small meals rather than three large meals a day may help relieve nausea and vomiting. If you start to feel nauseous, eating a few soda crackers can be helpful. Drinking liquids between meals, instead of during meals, also seems to help ease nausea and vomiting.  Limit foods that are high in fat and processed sugars, such as fried and sweet foods.  To prevent constipation: ? Eat foods that are high in fiber, such as fresh fruits and vegetables, whole grains, and beans. ? Drink enough fluid to keep your urine clear or pale yellow. Activity  Exercise only as directed by your health care provider. Most women can continue their usual exercise routine during pregnancy. Try to exercise for 30 minutes at least 5 days a week. Exercising will help you: ? Control your weight. ? Stay in shape. ? Be prepared for labor and delivery.  Experiencing pain or cramping in the lower abdomen or lower back is a good sign that you should stop exercising. Check with your health care provider before continuing with normal exercises.  Try to avoid standing for  long periods of time. Move your legs often if you must stand in one place for a long time.  Avoid heavy lifting.  Wear low-heeled shoes and practice good posture.  You may continue to have sex unless your health care provider tells you not to. Relieving pain and discomfort  Wear a good support bra to relieve breast tenderness.  Take warm sitz baths to soothe any pain or discomfort caused by hemorrhoids. Use hemorrhoid cream if your health care provider approves.  Rest with your legs elevated if you have leg cramps or low back pain.  If you develop varicose veins in your legs, wear support hose. Elevate your feet for 15 minutes, 3-4 times a day. Limit salt in your diet. Prenatal care  Schedule your prenatal visits by the twelfth week of pregnancy. They are usually scheduled monthly at first, then more often in the last 2 months before delivery.  Write down your questions. Take them to your prenatal visits.  Keep all your prenatal visits as told by your health care provider. This is important. Safety  Wear your seat belt at all times when driving.  Make a list of emergency phone numbers, including numbers for family, friends, the hospital, and police and fire departments. General instructions  Ask your health care provider for a referral to a local prenatal education class. Begin classes no later than the beginning of month 6 of your pregnancy.  Ask for help if you have counseling or nutritional needs during pregnancy. Your health care provider can offer advice or refer you to specialists for help with various needs.  Do not use hot tubs, steam rooms, or saunas.  Do not douche or use tampons or scented sanitary pads.  Do not cross your legs for long periods of time.  Avoid cat litter boxes and soil used by cats.   These carry germs that can cause birth defects in the baby and possibly loss of the fetus by miscarriage or stillbirth.  Avoid all smoking, herbs, alcohol, and  medicines not prescribed by your health care provider. Chemicals in these products affect the formation and growth of the baby.  Do not use any products that contain nicotine or tobacco, such as cigarettes and e-cigarettes. If you need help quitting, ask your health care provider. You may receive counseling support and other resources to help you quit.  Schedule a dentist appointment. At home, brush your teeth with a soft toothbrush and be gentle when you floss. Contact a health care provider if:  You have dizziness.  You have mild pelvic cramps, pelvic pressure, or nagging pain in the abdominal area.  You have persistent nausea, vomiting, or diarrhea.  You have a bad smelling vaginal discharge.  You have pain when you urinate.  You notice increased swelling in your face, hands, legs, or ankles.  You are exposed to fifth disease or chickenpox.  You are exposed to German measles (rubella) and have never had it. Get help right away if:  You have a fever.  You are leaking fluid from your vagina.  You have spotting or bleeding from your vagina.  You have severe abdominal cramping or pain.  You have rapid weight gain or loss.  You vomit blood or material that looks like coffee grounds.  You develop a severe headache.  You have shortness of breath.  You have any kind of trauma, such as from a fall or a car accident. Summary  The first trimester of pregnancy is from week 1 until the end of week 13 (months 1 through 3).  Your body goes through many changes during pregnancy. The changes vary from woman to woman.  You will have routine prenatal visits. During those visits, your health care provider will examine you, discuss any test results you may have, and talk with you about how you are feeling. This information is not intended to replace advice given to you by your health care provider. Make sure you discuss any questions you have with your health care provider. Document  Released: 06/12/2001 Document Revised: 05/30/2016 Document Reviewed: 05/30/2016 Elsevier Interactive Patient Education  2019 [employer] Inc.  

## 2018-07-07 NOTE — Progress Notes (Signed)
Patient ID: Marissa Friedman, female   DOB: April 24, 1996, 23 y.o.   MRN: [id number] History of Present Illness:  Marissa Friedman is a 23 year old white female in for UPT, has missed periods and had 2+HPTs.Las baby was born 11/18/17.   Current Medications, Allergies, Past Medical History, Past Surgical History, Family History and Social History were reviewed in [employer] record.     Review of Systems: +missed periods with 2+HPTs +nausea in am   Physical Exam:BP 107/61 (BP Location: Left Arm, Patient Position: Sitting)   Pulse 88   Ht 5\' 4"  (1.626 m)   Wt 186 lb (84.4 kg)   LMP 04/21/2018   BMI 31.93 kg/m   UPT+, about 11 weeks by LMP with EDD 01/27/2019. General:  Well developed, well nourished, no acute distress Skin:  Warm and dry Neck:  Midline trachea, normal thyroid, good ROM, no lymphadenopathy Lungs; Clear to auscultation bilaterally Cardiovascular: Regular rate and rhythm Abdomen:  Soft, non tender  Psych:  No mood changes, alert and cooperative,seems happy PHQ 2 score 0. Fall risk is low.  Impression: 1. Pregnancy test positive   2. [redacted] weeks gestation of pregnancy   3. Encounter to determine fetal viability of pregnancy, single or unspecified fetus   4. Short interval between pregnancies affecting pregnancy in first trimester, antepartum       Plan: Meds ordered this encounter  Medications  . prenatal vitamin w/FE, FA (PRENATAL 1 + 1) 27-1 MG TABS tablet    Sig: Take 1 tablet by mouth daily at 12 noon.    Dispense:  30 each    Refill:  12    Order Specific Question:   Supervising Provider    Answer:   Lazaro Arms [[id number]]  Return in 1 week for dating [place] and 2 weeks for New OB Review handouts on First and second trimester and by [facility]

## 2018-07-14 ENCOUNTER — Ambulatory Visit (INDEPENDENT_AMBULATORY_CARE_PROVIDER_SITE_OTHER): Payer: Medicaid Other

## 2018-07-14 DIAGNOSIS — O3680X0 Pregnancy with inconclusive fetal viability, not applicable or unspecified: Secondary | ICD-10-CM | POA: Diagnosis not present

## 2018-07-14 NOTE — Progress Notes (Signed)
[place] 11 wks,single IUP,CRL 41.81 mm,normal ovaries bilat,fhr 169 bpm

## 2018-07-29 ENCOUNTER — Encounter: Payer: Medicaid Other | Admitting: Women's Health

## 2018-07-29 ENCOUNTER — Ambulatory Visit: Payer: Medicaid Other | Admitting: *Deleted

## 2018-08-12 ENCOUNTER — Ambulatory Visit: Payer: Medicaid Other | Admitting: *Deleted

## 2018-08-12 ENCOUNTER — Encounter: Payer: Self-pay | Admitting: *Deleted

## 2018-08-12 ENCOUNTER — Encounter: Payer: Medicaid Other | Admitting: Women's Health

## 2018-09-08 ENCOUNTER — Ambulatory Visit (INDEPENDENT_AMBULATORY_CARE_PROVIDER_SITE_OTHER): Payer: Medicaid Other | Admitting: Women's Health

## 2018-09-08 ENCOUNTER — Ambulatory Visit: Payer: Medicaid Other | Admitting: *Deleted

## 2018-09-08 ENCOUNTER — Other Ambulatory Visit: Payer: Self-pay

## 2018-09-08 ENCOUNTER — Other Ambulatory Visit (HOSPITAL_COMMUNITY)
Admission: RE | Admit: 2018-09-08 | Discharge: 2018-09-08 | Disposition: A | Discharge status: Home / Self Care | Payer: Medicaid Other | Source: Ambulatory Visit | Attending: Obstetrics & Gynecology | Admitting: Obstetrics & Gynecology

## 2018-09-08 ENCOUNTER — Encounter: Payer: Self-pay | Admitting: Women's Health

## 2018-09-08 VITALS — BP 106/66 | HR 86 | Wt 183.0 lb

## 2018-09-08 DIAGNOSIS — Z3482 Encounter for supervision of other normal pregnancy, second trimester: Secondary | ICD-10-CM

## 2018-09-08 DIAGNOSIS — Z1389 Encounter for screening for other disorder: Secondary | ICD-10-CM

## 2018-09-08 DIAGNOSIS — Z3A19 19 weeks gestation of pregnancy: Secondary | ICD-10-CM

## 2018-09-08 DIAGNOSIS — O0932 Supervision of pregnancy with insufficient antenatal care, second trimester: Secondary | ICD-10-CM | POA: Diagnosis not present

## 2018-09-08 DIAGNOSIS — O09892 Supervision of other high risk pregnancies, second trimester: Secondary | ICD-10-CM

## 2018-09-08 DIAGNOSIS — Z23 Encounter for immunization: Secondary | ICD-10-CM | POA: Diagnosis not present

## 2018-09-08 DIAGNOSIS — Z363 Encounter for antenatal screening for malformations: Secondary | ICD-10-CM

## 2018-09-08 DIAGNOSIS — O09891 Supervision of other high risk pregnancies, first trimester: Secondary | ICD-10-CM

## 2018-09-08 DIAGNOSIS — Z3490 Encounter for supervision of normal pregnancy, unspecified, unspecified trimester: Secondary | ICD-10-CM | POA: Insufficient documentation

## 2018-09-08 DIAGNOSIS — Z331 Pregnant state, incidental: Secondary | ICD-10-CM

## 2018-09-08 LAB — POCT URINALYSIS DIPSTICK OB
GLUCOSE, UA: NEGATIVE
Ketones, UA: NEGATIVE
Leukocytes, UA: NEGATIVE
Nitrite, UA: NEGATIVE
RBC UA: NEGATIVE

## 2018-09-08 NOTE — Progress Notes (Signed)
INITIAL OBSTETRICAL VISIT Patient name: Marissa Friedman MRN [id number]  Date of birth: May 18, 1996 Chief Complaint:   Initial Prenatal Visit (?sinus infection, rash on right hip)  History of Present Illness:   Marissa Friedman is a 23 y.o. G38P3003 Caucasian female at [redacted]w[redacted]d by 11wk u/s, with an Estimated Date of Delivery: 02/02/19 being seen today for her initial obstetrical visit.   Her obstetrical history is significant for term uncomplicated SVB x 3.   Today she reports congestion, cough, Rt ear pain x 2days. No fever/chills, body aches, etc.  Patient's last menstrual period was 04/21/2018 (within weeks). Last pap >72yrs ago. Results were: normal Review of Systems:   Pertinent items are noted in HPI Denies cramping/contractions, leakage of fluid, vaginal bleeding, abnormal vaginal discharge w/ itching/odor/irritation, headaches, visual changes, shortness of breath, chest pain, abdominal pain, severe nausea/vomiting, or problems with urination or bowel movements unless otherwise stated above.  Pertinent History Reviewed:  Reviewed past medical,surgical, social, obstetrical and family history.  Reviewed problem list, medications and allergies. OB History  Gravida Para Term Preterm AB Living  4 3 3     3   SAB TAB Ectopic Multiple Live Births        0 3    # Outcome Date GA Lbr Len/2nd Weight Sex Delivery Anes PTL Lv  4 Current           3 Term 11/18/17 [redacted]w[redacted]d 03:00 / 00:26 8 lb (3.63 kg) F Vag-Spont EPI  LIV  2 Term 03/23/15 [redacted]w[redacted]d  8 lb 15 oz (4.054 kg) M Vag-Spont EPI N LIV  1 Term 02/06/13 [redacted]w[redacted]d  7 lb 15 oz (3.6 kg) F Vag-Spont EPI N LIV     Birth Comments: System Generated. Please review and update pregnancy details.    Obstetric Comments  [facility]   Physical Assessment:   Vitals:   09/08/18 1402  BP: 106/66  Pulse: 86  Weight: 183 lb (83 kg)  Body mass index is 31.41 kg/m.       Physical Examination:  General appearance - well appearing, and in no  distress  Mental status - alert, oriented to person, place, and time  Psych:  She has a normal mood and affect  Skin - warm and dry, normal color, no suspicious lesions noted  Ears: Lt normal, Rt small amt fluid  Sinuses: no TTP  Throat: no erythema/exudate  Neck: no lymphadenopathy  Chest - effort normal, all lung fields clear to auscultation bilaterally  Heart - normal rate and regular rhythm  Abdomen - soft, nontender  Extremities:  No swelling or varicosities noted  Pelvic - VULVA: normal appearing vulva with no masses, tenderness or lesions  VAGINA: normal appearing vagina with normal color and discharge, no lesions  CERVIX: normal appearing cervix without discharge or lesions, no CMT  Thin prep pap is done w/ reflex HR HPV cotesting  Fetal Heart Rate (bpm): 140 via doppler  Results for orders placed or performed in visit on 09/08/18 (from the past 24 hour(s))  POC Urinalysis Dipstick OB   Collection Time: 09/08/18  2:23 PM  Result Value Ref Range   Color, UA     Clarity, UA     Glucose, UA Negative Negative   Bilirubin, UA     Ketones, UA neg    Spec Grav, UA     Blood, UA neg    pH, UA     POC,PROTEIN,UA Trace Negative, Trace, Small (1+), Moderate (2+), Large (3+), 4+  Urobilinogen, UA     Nitrite, UA neg    Leukocytes, UA Negative Negative   Appearance     Odor      Assessment & Plan:  1) Low-Risk Pregnancy G4P3003 at [redacted]w[redacted]d with an Estimated Date of Delivery: 02/02/19   2) Initial OB visit  3) URI> gave printed relief measures, call next Monday if not improving  4) Late prenatal care  Meds: No orders of the defined types were placed in this encounter.   Initial labs obtained Continue prenatal vitamins Reviewed n/v relief measures and warning s/s to report Reviewed recommended weight gain based on pre-gravid BMI Encouraged well-balanced diet Genetic Screening discussed: declined Cystic fibrosis, SMA, Fragile X screening discussed declined Ultrasound  discussed; fetal survey: requested CCNC completed>spoke w/ [place]  Follow-up: Return for asap for anatomy u/s (no visit), then 4wks for LROB.   Orders Placed This Encounter  Procedures  . GC/Chlamydia Probe Amp  . Urine Culture  . US OB Comp + 14 Wk  . Flu Vaccine QUAD 36+ mos IM  . Obstetric Panel, Including HIV  . Urinalysis, Routine w reflex microscopic  . Sickle cell screen  . Pain Management Screening Profile (10S)  . POC Urinalysis Dipstick OB    Cheral Marker CNM, [facility] 09/08/2018 2:29 PM

## 2018-09-08 NOTE — Patient Instructions (Addendum)
Marissa Friedman, I greatly value your feedback.  If you receive a survey following your visit with [place] today, we appreciate you taking the time to fill it out.  Thanks, Joellyn Haff, CNM, [facility]  [facility] HAS MOVED!!! It is now [facility] at [facility] ([address] [place], [place] [address]) Entrance located off of [address] Free 24/7 valet parking    You have a viral infection that will resolve on its own over time.  Symptoms typically last 3-7 days but can stretch out to 2-3 weeks.  Unfortunately, antibiotics are not helpful for viral infections.  Humidifier, saline nasal spray, Sudafed for nasal congestion  Regular robitussin, cough drops for cough  Warm salt water gargles for sore throat  Mucinex with lots of water to help you cough up the mucous in your chest if needed  Drink plenty of fluids and stay hydrated!  Wash your hands frequently.  Call if you are not improving by 7-10 days.    Second Trimester of Pregnancy The second trimester is from week 14 through week 27 (months 4 through 6). The second trimester is often a time when you feel your best. Your body has adjusted to being pregnant, and you begin to feel better physically. Usually, morning sickness has lessened or quit completely, you may have more energy, and you may have an increase in appetite. The second trimester is also a time when the fetus is growing rapidly. At the end of the sixth month, the fetus is about 9 inches long and weighs about 1 pounds. You will likely begin to feel the baby move (quickening) between 16 and 20 weeks of pregnancy. Body changes during your second trimester Your body continues to go through many changes during your second trimester. The changes vary from woman to woman.  Your weight will continue to increase. You will notice your lower abdomen bulging out.  You may begin to get stretch marks on your hips, abdomen, and breasts.  You may develop headaches  that can be relieved by medicines. The medicines should be approved by your health care provider.  You may urinate more often because the fetus is pressing on your bladder.  You may develop or continue to have heartburn as a result of your pregnancy.  You may develop constipation because certain hormones are causing the muscles that push waste through your intestines to slow down.  You may develop hemorrhoids or swollen, bulging veins (varicose veins).  You may have back pain. This is caused by: ? Weight gain. ? Pregnancy hormones that are relaxing the joints in your pelvis. ? A shift in weight and the muscles that support your balance.  Your breasts will continue to grow and they will continue to become tender.  Your gums may bleed and may be sensitive to brushing and flossing.  Dark spots or blotches (chloasma, mask of pregnancy) may develop on your face. This will likely fade after the baby is born.  A dark line from your belly button to the pubic area (linea nigra) may appear. This will likely fade after the baby is born.  You may have changes in your hair. These can include thickening of your hair, rapid growth, and changes in texture. Some women also have hair loss during or after pregnancy, or hair that feels dry or thin. Your hair will most likely return to normal after your baby is born.  What to expect at prenatal visits During a routine prenatal visit:  You will be weighed to make sure you and the fetus are growing normally.  Your blood pressure will be taken.  Your abdomen will be measured to track your baby's growth.  The fetal heartbeat will be listened to.  Any test results from the previous visit will be discussed.  Your health care provider may ask you:  How you are feeling.  If you are feeling the baby move.  If you have had any abnormal symptoms, such as leaking fluid, bleeding, severe headaches, or abdominal cramping.  If you are using any tobacco  products, including cigarettes, chewing tobacco, and electronic cigarettes.  If you have any questions.  Other tests that may be performed during your second trimester include:  Blood tests that check for: ? Low iron levels (anemia). ? High blood sugar that affects pregnant women (gestational diabetes) between 50 and 28 weeks. ? Rh antibodies. This is to check for a protein on red blood cells (Rh factor).  Urine tests to check for infections, diabetes, or protein in the urine.  An ultrasound to confirm the proper growth and development of the baby.  An amniocentesis to check for possible genetic problems.  Fetal screens for spina bifida and Down syndrome.  HIV (human immunodeficiency virus) testing. Routine prenatal testing includes screening for HIV, unless you choose not to have this test.  Follow these instructions at home: Medicines  Follow your health care provider's instructions regarding medicine use. Specific medicines may be either safe or unsafe to take during pregnancy.  Take a prenatal vitamin that contains at least 600 micrograms (mcg) of folic acid.  If you develop constipation, try taking a stool softener if your health care provider approves. Eating and drinking  Eat a balanced diet that includes fresh fruits and vegetables, whole grains, good sources of protein such as meat, eggs, or tofu, and low-fat dairy. Your health care provider will help you determine the amount of weight gain that is right for you.  Avoid raw meat and uncooked cheese. These carry germs that can cause birth defects in the baby.  If you have low calcium intake from food, talk to your health care provider about whether you should take a daily calcium supplement.  Limit foods that are high in fat and processed sugars, such as fried and sweet foods.  To prevent constipation: ? Drink enough fluid to keep your urine clear or pale yellow. ? Eat foods that are high in fiber, such as fresh  fruits and vegetables, whole grains, and beans. Activity  Exercise only as directed by your health care provider. Most women can continue their usual exercise routine during pregnancy. Try to exercise for 30 minutes at least 5 days a week. Stop exercising if you experience uterine contractions.  Avoid heavy lifting, wear low heel shoes, and practice good posture.  A sexual relationship may be continued unless your health care provider directs you otherwise. Relieving pain and discomfort  Wear a good support bra to prevent discomfort from breast tenderness.  Take warm sitz baths to soothe any pain or discomfort caused by hemorrhoids. Use hemorrhoid cream if your health care provider approves.  Rest with your legs elevated if you have leg cramps or low back pain.  If you develop varicose veins, wear support hose. Elevate your feet for 15 minutes, 3-4 times a day. Limit salt in your diet. Prenatal Care  Write down your questions. Take them to your prenatal visits.  Keep all your prenatal visits as told by your  health care provider. This is important. Safety  Wear your seat belt at all times when driving.  Make a list of emergency phone numbers, including numbers for family, friends, the hospital, and police and fire departments. General instructions  Ask your health care provider for a referral to a local prenatal education class. Begin classes no later than the beginning of month 6 of your pregnancy.  Ask for help if you have counseling or nutritional needs during pregnancy. Your health care provider can offer advice or refer you to specialists for help with various needs.  Do not use hot tubs, steam rooms, or saunas.  Do not douche or use tampons or scented sanitary pads.  Do not cross your legs for long periods of time.  Avoid cat litter boxes and soil used by cats. These carry germs that can cause birth defects in the baby and possibly loss of the fetus by miscarriage or  stillbirth.  Avoid all smoking, herbs, alcohol, and unprescribed drugs. Chemicals in these products can affect the formation and growth of the baby.  Do not use any products that contain nicotine or tobacco, such as cigarettes and e-cigarettes. If you need help quitting, ask your health care provider.  Visit your dentist if you have not gone yet during your pregnancy. Use a soft toothbrush to brush your teeth and be gentle when you floss. Contact a health care provider if:  You have dizziness.  You have mild pelvic cramps, pelvic pressure, or nagging pain in the abdominal area.  You have persistent nausea, vomiting, or diarrhea.  You have a bad smelling vaginal discharge.  You have pain when you urinate. Get help right away if:  You have a fever.  You are leaking fluid from your vagina.  You have spotting or bleeding from your vagina.  You have severe abdominal cramping or pain.  You have rapid weight gain or weight loss.  You have shortness of breath with chest pain.  You notice sudden or extreme swelling of your face, hands, ankles, feet, or legs.  You have not felt your baby move in over an hour.  You have severe headaches that do not go away when you take medicine.  You have vision changes. Summary  The second trimester is from week 14 through week 27 (months 4 through 6). It is also a time when the fetus is growing rapidly.  Your body goes through many changes during pregnancy. The changes vary from woman to woman.  Avoid all smoking, herbs, alcohol, and unprescribed drugs. These chemicals affect the formation and growth your baby.  Do not use any tobacco products, such as cigarettes, chewing tobacco, and e-cigarettes. If you need help quitting, ask your health care provider.  Contact your health care provider if you have any questions. Keep all prenatal visits as told by your health care provider. This is important. This information is not intended to replace  advice given to you by your health care provider. Make sure you discuss any questions you have with your health care provider. Document Released: 06/12/2001 Document Revised: 11/24/2015 Document Reviewed: 08/19/2012 Elsevier Interactive Patient Education  2017 [employer].

## 2018-09-09 LAB — URINALYSIS, ROUTINE W REFLEX MICROSCOPIC
Bilirubin, UA: NEGATIVE
Glucose, UA: NEGATIVE
KETONES UA: NEGATIVE
NITRITE UA: NEGATIVE
RBC, UA: NEGATIVE
SPEC GRAV UA: 1.023 (ref 1.005–1.030)
UUROB: 1 mg/dL (ref 0.2–1.0)
pH, UA: 6.5 (ref 5.0–7.5)

## 2018-09-09 LAB — OBSTETRIC PANEL, INCLUDING HIV
Antibody Screen: NEGATIVE
Basophils Absolute: 0 10*3/uL (ref 0.0–0.2)
Basos: 1 %
EOS (ABSOLUTE): 0.2 10*3/uL (ref 0.0–0.4)
EOS: 3 %
HEMATOCRIT: 35.5 % (ref 34.0–46.6)
HEMOGLOBIN: 12.5 g/dL (ref 11.1–15.9)
HEP B S AG: NEGATIVE
HIV Screen 4th Generation wRfx: NONREACTIVE
IMMATURE GRANS (ABS): 0 10*3/uL (ref 0.0–0.1)
IMMATURE GRANULOCYTES: 1 %
LYMPHS: 19 %
Lymphocytes Absolute: 1.2 10*3/uL (ref 0.7–3.1)
MCH: 31.2 pg (ref 26.6–33.0)
MCHC: 35.2 g/dL (ref 31.5–35.7)
MCV: 89 fL (ref 79–97)
MONOCYTES: 7 %
Monocytes Absolute: 0.5 10*3/uL (ref 0.1–0.9)
Neutrophils Absolute: 4.5 10*3/uL (ref 1.4–7.0)
Neutrophils: 69 %
Platelets: 153 10*3/uL (ref 150–450)
RBC: 4.01 x10E6/uL (ref 3.77–5.28)
RDW: 14 % (ref 11.7–15.4)
RH TYPE: POSITIVE
RPR: NONREACTIVE
RUBELLA: 1.64 {index} (ref >0.99)
WBC: 6.4 10*3/uL (ref 3.4–10.8)

## 2018-09-09 LAB — PMP SCREEN PROFILE (10S), URINE
AMPHETAMINE SCREEN URINE: NEGATIVE ng/mL
BARBITURATE SCREEN URINE: NEGATIVE ng/mL
BENZODIAZEPINE SCREEN, URINE: NEGATIVE ng/mL
CANNABINOIDS UR QL SCN: NEGATIVE ng/mL
COCAINE(METAB.)SCREEN, URINE: NEGATIVE ng/mL
Creatinine(Crt), U: 211.5 mg/dL (ref 20.0–300.0)
METHADONE SCREEN, URINE: NEGATIVE ng/mL
OXYCODONE+OXYMORPHONE UR QL SCN: NEGATIVE ng/mL
Opiate Scrn, Ur: NEGATIVE ng/mL
PHENCYCLIDINE QUANTITATIVE URINE: NEGATIVE ng/mL
PROPOXYPHENE SCREEN URINE: NEGATIVE ng/mL
Ph of Urine: 6.7 (ref 4.5–8.9)

## 2018-09-09 LAB — MICROSCOPIC EXAMINATION: Casts: NONE SEEN /lpf

## 2018-09-09 LAB — SICKLE CELL SCREEN: SICKLE CELL SCREEN: NEGATIVE

## 2018-09-10 LAB — URINE CULTURE

## 2018-09-11 LAB — CYTOLOGY - PAP
CHLAMYDIA, DNA PROBE: NEGATIVE
Diagnosis: NEGATIVE
Neisseria Gonorrhea: NEGATIVE

## 2018-09-15 ENCOUNTER — Other Ambulatory Visit: Payer: Self-pay | Admitting: Women's Health

## 2018-09-15 DIAGNOSIS — O9989 Other specified diseases and conditions complicating pregnancy, childbirth and the puerperium: Principal | ICD-10-CM

## 2018-09-15 DIAGNOSIS — R8271 Bacteriuria: Secondary | ICD-10-CM

## 2018-09-15 MED ORDER — NITROFURANTOIN MONOHYD MACRO 100 MG PO CAPS: 100.0000 mg | ORAL_CAPSULE | Freq: Two times a day (BID) | ORAL | 0 refills | Status: DC

## 2018-09-16 ENCOUNTER — Other Ambulatory Visit: Payer: Medicaid Other

## 2018-09-17 ENCOUNTER — Telehealth: Payer: Self-pay | Admitting: Women's Health

## 2018-09-17 NOTE — Telephone Encounter (Signed)
Called pt, notified of UTI, rx macrobid. Pap normal but w/ yeast- is having sx. To use otc monistat 7.  Cheral Marker, CNM, WHNP-BC 09/17/2018 4:20 PM

## 2018-09-29 ENCOUNTER — Ambulatory Visit (INDEPENDENT_AMBULATORY_CARE_PROVIDER_SITE_OTHER): Payer: Medicaid Other

## 2018-09-29 ENCOUNTER — Other Ambulatory Visit: Payer: Self-pay

## 2018-09-29 DIAGNOSIS — Z3482 Encounter for supervision of other normal pregnancy, second trimester: Secondary | ICD-10-CM

## 2018-09-29 DIAGNOSIS — O0932 Supervision of pregnancy with insufficient antenatal care, second trimester: Secondary | ICD-10-CM

## 2018-09-29 DIAGNOSIS — Z363 Encounter for antenatal screening for malformations: Secondary | ICD-10-CM

## 2018-09-29 DIAGNOSIS — Z3A22 22 weeks gestation of pregnancy: Secondary | ICD-10-CM | POA: Diagnosis not present

## 2018-09-29 NOTE — Progress Notes (Signed)
[place] 22 wks,cephalic,cx 5.1 cm,normal ovaries bilat,anterior placenta gr 0,svp of fluid 6 cm,fhr 140 bpm,bilat mild renal pelvic dilatation,right 4.9 mm,left 5 mm,EFW 482 g 53%,anatomy complete

## 2018-10-02 ENCOUNTER — Encounter: Payer: Self-pay | Admitting: *Deleted

## 2018-10-02 ENCOUNTER — Other Ambulatory Visit: Payer: Self-pay | Admitting: Women's Health

## 2018-10-02 ENCOUNTER — Other Ambulatory Visit: Payer: Self-pay | Admitting: *Deleted

## 2018-10-02 DIAGNOSIS — Z3482 Encounter for supervision of other normal pregnancy, second trimester: Secondary | ICD-10-CM

## 2018-10-02 MED ORDER — CLOTRIMAZOLE 1 % EX CREA: 1.0000 "application " | TOPICAL_CREAM | Freq: Two times a day (BID) | CUTANEOUS | 0 refills | Status: DC

## 2018-10-03 ENCOUNTER — Encounter: Payer: Self-pay | Admitting: *Deleted

## 2018-10-06 ENCOUNTER — Other Ambulatory Visit: Payer: Self-pay

## 2018-10-06 ENCOUNTER — Ambulatory Visit (INDEPENDENT_AMBULATORY_CARE_PROVIDER_SITE_OTHER): Payer: Medicaid Other | Admitting: Women's Health

## 2018-10-06 ENCOUNTER — Encounter: Payer: Self-pay | Admitting: Women's Health

## 2018-10-06 VITALS — BP 109/67 | HR 93

## 2018-10-06 DIAGNOSIS — Z3482 Encounter for supervision of other normal pregnancy, second trimester: Secondary | ICD-10-CM

## 2018-10-06 DIAGNOSIS — Z3A23 23 weeks gestation of pregnancy: Secondary | ICD-10-CM | POA: Diagnosis not present

## 2018-10-06 NOTE — Progress Notes (Signed)
   TELEHEALTH VIRTUAL OBSTETRICS VISIT ENCOUNTER NOTE  I connected with [place] on 10/06/18 at  1:30 PM EDT by telephone at home and verified that I am speaking with the correct person using two identifiers.   I discussed the limitations, risks, security and privacy concerns of performing an evaluation and management service by telephone and the availability of in person appointments. I also discussed with the patient that there may be a patient responsible charge related to this service. The patient expressed understanding and agreed to proceed.  Subjective:  Marissa Friedman is a 23 y.o. [phone] at [redacted]w[redacted]d being followed for ongoing prenatal care.  She is currently monitored for the following issues for this low-risk pregnancy and has Rubella non-immune status, antepartum; Short interval between pregnancies affecting pregnancy in first trimester, antepartum; Supervision of normal pregnancy; Late prenatal care in second trimester; and Asymptomatic bacteriuria during pregnancy in second trimester on their problem list.  Patient reports no complaints. Reports fetal movement. Denies any contractions, bleeding or leaking of fluid.   The following portions of the patient's history were reviewed and updated as appropriate: allergies, current medications, past family history, past medical history, past social history, past surgical history and problem list.   Objective:   General:  Alert, oriented and cooperative.   Mental Status: Normal mood and affect perceived. Normal judgment and thought content.  Rest of physical exam deferred due to type of encounter BP 109/67   Pulse 93   LMP 04/21/2018 (Within Weeks)   Assessment and Plan:  Pregnancy: G4P3003 at [redacted]w[redacted]d 1. Encounter for supervision of other normal pregnancy in second trimester   Preterm labor symptoms and general obstetric precautions including but not limited to vaginal bleeding, contractions, leaking of fluid and fetal  movement were reviewed in detail with the patient.  I discussed the assessment and treatment plan with the patient. The patient was provided an opportunity to ask questions and all were answered. The patient agreed with the plan and demonstrated an understanding of the instructions. The patient was advised to call back or seek an in-person office evaluation/go to [facility] at [facility] for any urgent or concerning symptoms. Please refer to After Visit Summary for other counseling recommendations.   I provided 7 minutes of non-face-to-face time during this encounter.  Return in about 4 weeks (around 11/03/2018) for LROB, PN2.  Future Appointments  Date Time Provider Department Center  11/03/2018  9:00 AM [facility]  11/03/2018  9:15 AM Cresenzo-Dishmon, Scarlette Calico, CNM [facility]    Cheral Marker, CNM [facility] [employer], [facility]

## 2018-10-31 ENCOUNTER — Encounter: Payer: Self-pay | Admitting: *Deleted

## 2018-11-03 ENCOUNTER — Other Ambulatory Visit: Payer: Medicaid Other

## 2018-11-03 ENCOUNTER — Encounter: Payer: Self-pay | Admitting: Advanced Practice Midwife

## 2018-11-03 ENCOUNTER — Other Ambulatory Visit: Payer: Self-pay

## 2018-11-03 ENCOUNTER — Ambulatory Visit (INDEPENDENT_AMBULATORY_CARE_PROVIDER_SITE_OTHER): Payer: Medicaid Other | Admitting: Advanced Practice Midwife

## 2018-11-03 VITALS — BP 110/73 | HR 77 | Temp 98.5°F | Wt 196.5 lb

## 2018-11-03 DIAGNOSIS — O35EXX0 Maternal care for other (suspected) fetal abnormality and damage, fetal genitourinary anomalies, not applicable or unspecified: Secondary | ICD-10-CM

## 2018-11-03 DIAGNOSIS — Z1389 Encounter for screening for other disorder: Secondary | ICD-10-CM

## 2018-11-03 DIAGNOSIS — Z331 Pregnant state, incidental: Secondary | ICD-10-CM

## 2018-11-03 DIAGNOSIS — O358XX0 Maternal care for other (suspected) fetal abnormality and damage, not applicable or unspecified: Secondary | ICD-10-CM

## 2018-11-03 DIAGNOSIS — Z87440 Personal history of urinary (tract) infections: Secondary | ICD-10-CM

## 2018-11-03 DIAGNOSIS — Z3482 Encounter for supervision of other normal pregnancy, second trimester: Secondary | ICD-10-CM

## 2018-11-03 DIAGNOSIS — Z3A27 27 weeks gestation of pregnancy: Secondary | ICD-10-CM

## 2018-11-03 LAB — POCT URINALYSIS DIPSTICK OB
Blood, UA: NEGATIVE
Glucose, UA: NEGATIVE
Ketones, UA: NEGATIVE
Leukocytes, UA: NEGATIVE
Nitrite, UA: NEGATIVE
POC,PROTEIN,UA: NEGATIVE

## 2018-11-03 NOTE — Progress Notes (Signed)
  P3P6886 [redacted]w[redacted]d Estimated Date of Delivery: 02/02/19  Blood pressure 110/73, pulse 77, temperature 98.5 F (36.9 C), weight 196 lb 8 oz (89.1 kg), last menstrual period 04/21/2018, unknown if currently breastfeeding.   BP weight and urine results all reviewed and noted.  Please refer to the obstetrical flow sheet for the fundal height and fetal heart rate documentation:  Patient reports good fetal movement, denies any bleeding and no rupture of membranes symptoms or regular contractions. Patient is without complaints. All questions were answered.  PN2 today.   Physical Assessment:   Vitals:   11/03/18 0959  BP: 110/73  Pulse: 77  Temp: 98.5 F (36.9 C)  Weight: 196 lb 8 oz (89.1 kg)  Body mass index is 33.73 kg/m.        Physical Examination:   General appearance: Well appearing, and in no distress  Mental status: Alert, oriented to person, place, and time  Skin: Warm & dry  Cardiovascular: Normal heart rate noted  Respiratory: Normal respiratory effort, no distress  Abdomen: Soft, gravid, nontender  Pelvic: Cervical exam deferred         Extremities: Edema: None  Fetal Status: Fetal Heart Rate (bpm): 130 Fundal Height: 26 cm Movement: Present    Results for orders placed or performed in visit on 11/03/18 (from the past 24 hour(s))  POC Urinalysis Dipstick OB   Collection Time: 11/03/18 10:00 AM  Result Value Ref Range   Color, UA     Clarity, UA     Glucose, UA Negative Negative   Bilirubin, UA     Ketones, UA neg    Spec Grav, UA     Blood, UA neg    pH, UA     POC,PROTEIN,UA Negative Negative, Trace, Small (1+), Moderate (2+), Large (3+), 4+   Urobilinogen, UA     Nitrite, UA neg    Leukocytes, UA Negative Negative   Appearance     Odor       Orders Placed This Encounter  Procedures  . Urine Culture  . US OB Follow Up  . POC Urinalysis Dipstick OB    Plan:  Continued routine obstetrical care, wants to defer Tdap until next visit (32 weeks)  Return  for web ex visit in 3 weeks;  F/U US/LROB visit in 5 weeks.

## 2018-11-03 NOTE — Patient Instructions (Signed)
Marissa Friedman, I greatly value your feedback.  If you receive a survey following your visit with [place] today, we appreciate you taking the time to fill it out.  Thanks, [name], CNM   Call the office [phone]) or go to [facility] if:  You begin to have strong, frequent contractions  Your water breaks.  Sometimes it is a big gush of fluid, sometimes it is just a trickle that keeps getting your panties wet or running down your legs  You have vaginal bleeding.  It is normal to have a small amount of spotting if your cervix was checked.   You don't feel your baby moving like normal.  If you don't, get you something to eat and drink and lay down and focus on feeling your baby move.  You should feel at least 10 movements in 2 hours.  If you don't, you should call the office or go to [facility].    Tdap Vaccine  It is recommended that you get the Tdap vaccine during the third trimester of EACH pregnancy to help protect your baby from getting pertussis (whooping cough)  27-36 weeks is the BEST time to do this so that you can pass the protection on to your baby. During pregnancy is better than after pregnancy, but if you are unable to get it during pregnancy it will be offered at the hospital.   You will be offered this vaccine in the office after 27 weeks. If you do not have health insurance, you can get this vaccine at the health department or your family doctor  Everyone who will be around your baby should also be up-to-date on their vaccines. Adults (who are not pregnant) only need 1 dose of Tdap during adulthood.   Third Trimester of Pregnancy The third trimester is from week 29 through week 42, months 7 through 9. The third trimester is a time when the fetus is growing rapidly. At the end of the ninth month, the fetus is about 20 inches in length and weighs 6-10 pounds.  BODY CHANGES Your body goes through many changes during pregnancy. The changes vary from woman  to woman.   Your weight will continue to increase. You can expect to gain 25-35 pounds (11-16 kg) by the end of the pregnancy.  You may begin to get stretch marks on your hips, abdomen, and breasts.  You may urinate more often because the fetus is moving lower into your pelvis and pressing on your bladder.  You may develop or continue to have heartburn as a result of your pregnancy.  You may develop constipation because certain hormones are causing the muscles that push waste through your intestines to slow down.  You may develop hemorrhoids or swollen, bulging veins (varicose veins).  You may have pelvic pain because of the weight gain and pregnancy hormones relaxing your joints between the bones in your pelvis. Backaches may result from overexertion of the muscles supporting your posture.  You may have changes in your hair. These can include thickening of your hair, rapid growth, and changes in texture. Some women also have hair loss during or after pregnancy, or hair that feels dry or thin. Your hair will most likely return to normal after your baby is born.  Your breasts will continue to grow and be tender. A yellow discharge may leak from your breasts called colostrum.  Your belly button may stick out.  You may feel short of breath because of your expanding uterus.  You may notice the fetus "dropping," or moving lower in your abdomen.  You may have a bloody mucus discharge. This usually occurs a few days to a week before labor begins.  Your cervix becomes thin and soft (effaced) near your due date. WHAT TO EXPECT AT YOUR PRENATAL EXAMS  You will have prenatal exams every 2 weeks until week 36. Then, you will have weekly prenatal exams. During a routine prenatal visit:  You will be weighed to make sure you and the fetus are growing normally.  Your blood pressure is taken.  Your abdomen will be measured to track your baby's growth.  The fetal heartbeat will be listened  to.  Any test results from the previous visit will be discussed.  You may have a cervical check near your due date to see if you have effaced. At around 36 weeks, your caregiver will check your cervix. At the same time, your caregiver will also perform a test on the secretions of the vaginal tissue. This test is to determine if a type of bacteria, Group B streptococcus, is present. Your caregiver will explain this further. Your caregiver may ask you:  What your birth plan is.  How you are feeling.  If you are feeling the baby move.  If you have had any abnormal symptoms, such as leaking fluid, bleeding, severe headaches, or abdominal cramping.  If you have any questions. Other tests or screenings that may be performed during your third trimester include:  Blood tests that check for low iron levels (anemia).  Fetal testing to check the health, activity level, and growth of the fetus. Testing is done if you have certain medical conditions or if there are problems during the pregnancy. FALSE LABOR You may feel small, irregular contractions that eventually go away. These are called Braxton Hicks contractions, or false labor. Contractions may last for hours, days, or even weeks before true labor sets in. If contractions come at regular intervals, intensify, or become painful, it is best to be seen by your caregiver.  SIGNS OF LABOR   Menstrual-like cramps.  Contractions that are 5 minutes apart or less.  Contractions that start on the top of the uterus and spread down to the lower abdomen and back.  A sense of increased pelvic pressure or back pain.  A watery or bloody mucus discharge that comes from the vagina. If you have any of these signs before the 37th week of pregnancy, call your caregiver right away. You need to go to the hospital to get checked immediately. HOME CARE INSTRUCTIONS   Avoid all smoking, herbs, alcohol, and unprescribed drugs. These chemicals affect the  formation and growth of the baby.  Follow your caregiver's instructions regarding medicine use. There are medicines that are either safe or unsafe to take during pregnancy.  Exercise only as directed by your caregiver. Experiencing uterine cramps is a good sign to stop exercising.  Continue to eat regular, healthy meals.  Wear a good support bra for breast tenderness.  Do not use hot tubs, steam rooms, or saunas.  Wear your seat belt at all times when driving.  Avoid raw meat, uncooked cheese, cat litter boxes, and soil used by cats. These carry germs that can cause birth defects in the baby.  Take your prenatal vitamins.  Try taking a stool softener (if your caregiver approves) if you develop constipation. Eat more high-fiber foods, such as fresh vegetables or fruit and whole grains. Drink plenty of fluids to keep your urine  clear or pale yellow.  Take warm sitz baths to soothe any pain or discomfort caused by hemorrhoids. Use hemorrhoid cream if your caregiver approves.  If you develop varicose veins, wear support hose. Elevate your feet for 15 minutes, 3-4 times a day. Limit salt in your diet.  Avoid heavy lifting, wear low heal shoes, and practice good posture.  Rest a lot with your legs elevated if you have leg cramps or low back pain.  Visit your dentist if you have not gone during your pregnancy. Use a soft toothbrush to brush your teeth and be gentle when you floss.  A sexual relationship may be continued unless your caregiver directs you otherwise.  Do not travel far distances unless it is absolutely necessary and only with the approval of your caregiver.  Take prenatal classes to understand, practice, and ask questions about the labor and delivery.  Make a trial run to the hospital.  Pack your hospital bag.  Prepare the baby's nursery.  Continue to go to all your prenatal visits as directed by your caregiver. SEEK MEDICAL CARE IF:  You are unsure if you are in  labor or if your water has broken.  You have dizziness.  You have mild pelvic cramps, pelvic pressure, or nagging pain in your abdominal area.  You have persistent nausea, vomiting, or diarrhea.  You have a bad smelling vaginal discharge.  You have pain with urination. SEEK IMMEDIATE MEDICAL CARE IF:   You have a fever.  You are leaking fluid from your vagina.  You have spotting or bleeding from your vagina.  You have severe abdominal cramping or pain.  You have rapid weight loss or gain.  You have shortness of breath with chest pain.  You notice sudden or extreme swelling of your face, hands, ankles, feet, or legs.  You have not felt your baby move in over an hour.  You have severe headaches that do not go away with medicine.  You have vision changes. Document Released: 06/12/2001 Document Revised: 06/23/2013 Document Reviewed: 08/19/2012 [facility] Patient Information 2015 [place], [place]. This information is not intended to replace advice given to you by your health care provider. Make sure you discuss any questions you have with your health care provider.

## 2018-11-04 LAB — CBC
Hematocrit: 35.2 % (ref 34.0–46.6)
Hemoglobin: 12 g/dL (ref 11.1–15.9)
MCH: 31.4 pg (ref 26.6–33.0)
MCHC: 34.1 g/dL (ref 31.5–35.7)
MCV: 92 fL (ref 79–97)
Platelets: 133 10*3/uL — ABNORMAL LOW (ref 150–450)
RBC: 3.82 x10E6/uL (ref 3.77–5.28)
RDW: 13.7 % (ref 11.7–15.4)
WBC: 6.6 10*3/uL (ref 3.4–10.8)

## 2018-11-04 LAB — RPR: RPR Ser Ql: NONREACTIVE

## 2018-11-04 LAB — GLUCOSE TOLERANCE, 2 HOURS W/ 1HR
Glucose, 1 hour: 102 mg/dL (ref 65–179)
Glucose, 2 hour: 85 mg/dL (ref 65–152)
Glucose, Fasting: 83 mg/dL (ref 65–91)

## 2018-11-04 LAB — HIV ANTIBODY (ROUTINE TESTING W REFLEX): HIV Screen 4th Generation wRfx: NONREACTIVE

## 2018-11-04 LAB — ANTIBODY SCREEN: Antibody Screen: NEGATIVE

## 2018-11-05 LAB — URINE CULTURE: Organism ID, Bacteria: NO GROWTH

## 2018-11-25 ENCOUNTER — Encounter: Payer: Medicaid Other | Admitting: Obstetrics & Gynecology

## 2018-12-05 ENCOUNTER — Encounter: Payer: Self-pay | Admitting: *Deleted

## 2018-12-08 ENCOUNTER — Other Ambulatory Visit: Payer: Self-pay

## 2018-12-08 ENCOUNTER — Ambulatory Visit (INDEPENDENT_AMBULATORY_CARE_PROVIDER_SITE_OTHER): Payer: Medicaid Other | Admitting: Obstetrics & Gynecology

## 2018-12-08 ENCOUNTER — Ambulatory Visit (INDEPENDENT_AMBULATORY_CARE_PROVIDER_SITE_OTHER): Payer: Medicaid Other

## 2018-12-08 VITALS — BP 112/72 | HR 98 | Wt 198.4 lb

## 2018-12-08 DIAGNOSIS — Z331 Pregnant state, incidental: Secondary | ICD-10-CM

## 2018-12-08 DIAGNOSIS — Z3A32 32 weeks gestation of pregnancy: Secondary | ICD-10-CM

## 2018-12-08 DIAGNOSIS — Z3483 Encounter for supervision of other normal pregnancy, third trimester: Secondary | ICD-10-CM

## 2018-12-08 DIAGNOSIS — Z3482 Encounter for supervision of other normal pregnancy, second trimester: Secondary | ICD-10-CM

## 2018-12-08 DIAGNOSIS — Z23 Encounter for immunization: Secondary | ICD-10-CM

## 2018-12-08 DIAGNOSIS — Z3403 Encounter for supervision of normal first pregnancy, third trimester: Secondary | ICD-10-CM

## 2018-12-08 DIAGNOSIS — O35EXX0 Maternal care for other (suspected) fetal abnormality and damage, fetal genitourinary anomalies, not applicable or unspecified: Secondary | ICD-10-CM

## 2018-12-08 DIAGNOSIS — O358XX0 Maternal care for other (suspected) fetal abnormality and damage, not applicable or unspecified: Secondary | ICD-10-CM | POA: Diagnosis not present

## 2018-12-08 DIAGNOSIS — Z1389 Encounter for screening for other disorder: Secondary | ICD-10-CM

## 2018-12-08 DIAGNOSIS — O0932 Supervision of pregnancy with insufficient antenatal care, second trimester: Secondary | ICD-10-CM

## 2018-12-08 LAB — POCT URINALYSIS DIPSTICK OB
Blood, UA: NEGATIVE
Ketones, UA: NEGATIVE
Leukocytes, UA: NEGATIVE
Nitrite, UA: NEGATIVE

## 2018-12-08 NOTE — Progress Notes (Signed)
   LOW-RISK PREGNANCY VISIT Patient name: Marissa Friedman MRN [id number]  Date of birth: 1995/07/20 Chief Complaint:   Routine Prenatal Visit (U/S)  History of Present Illness:   Marissa Friedman is a 23 y.o. [phone] female at [redacted]w[redacted]d with an Estimated Date of Delivery: 02/02/19 being seen today for ongoing management of a low-risk pregnancy.  Today she reports no complaints. Contractions: Not present.  .  Movement: Present. denies leaking of fluid. Review of Systems:   Pertinent items are noted in HPI Denies abnormal vaginal discharge w/ itching/odor/irritation, headaches, visual changes, shortness of breath, chest pain, abdominal pain, severe nausea/vomiting, or problems with urination or bowel movements unless otherwise stated above. Pertinent History Reviewed:  Reviewed past medical,surgical, social, obstetrical and family history.  Reviewed problem list, medications and allergies. Physical Assessment:   Vitals:   12/08/18 1228  BP: 112/72  Pulse: 98  Weight: 198 lb 6.4 oz (90 kg)  Body mass index is 34.06 kg/m.        Physical Examination:   General appearance: Well appearing, and in no distress  Mental status: Alert, oriented to person, place, and time  Skin: Warm & dry  Cardiovascular: Normal heart rate noted  Respiratory: Normal respiratory effort, no distress  Abdomen: Soft, gravid, nontender  Pelvic: Cervical exam deferred         Extremities: Edema: None  Fetal Status: Fetal Heart Rate (bpm): 140   Movement: Present    Results for orders placed or performed in visit on 12/08/18 (from the past 24 hour(s))  POC Urinalysis Dipstick OB   Collection Time: 12/08/18 12:33 PM  Result Value Ref Range   Color, UA     Clarity, UA     Glucose, UA Trace (A) Negative   Bilirubin, UA     Ketones, UA neg    Spec Grav, UA     Blood, UA neg    pH, UA     POC,PROTEIN,UA Trace Negative, Trace, Small (1+), Moderate (2+), Large (3+), 4+   Urobilinogen, UA     Nitrite, UA neg     Leukocytes, UA Negative Negative   Appearance     Odor      Assessment & Plan:  1) Low-risk pregnancy G4P3003 at [redacted]w[redacted]d with an Estimated Date of Delivery: 02/02/19   2) Renal pelvic dilation, resolved on sonogram today,    Meds: No orders of the defined types were placed in this encounter.  Labs/procedures today: sonogram  Plan:  Continue routine obstetrical care   Reviewed: Preterm labor symptoms and general obstetric precautions including but not limited to vaginal bleeding, contractions, leaking of fluid and fetal movement were reviewed in detail with the patient.  All questions were answered  Follow-up: Return in about 3 weeks (around 12/29/2018) for webex, , LROB.  Orders Placed This Encounter  Procedures  . POC Urinalysis Dipstick OB   Florian Buff  12/08/2018 12:39 PM

## 2018-12-08 NOTE — Addendum Note (Signed)
Addended by: Diona Fanti A on: 12/08/2018 12:43 PM   Modules accepted: Orders

## 2018-12-08 NOTE — Progress Notes (Signed)
[place] 32 wks,cephalic,cx 3.4 cm,anterior placenta gr 3,normal ovaries bilat,afi 15 cm,fhr 140 bpm,normal kidneys,efw 1768 g 24%

## 2018-12-29 ENCOUNTER — Encounter: Payer: Medicaid Other | Admitting: Women's Health

## 2019-01-01 ENCOUNTER — Ambulatory Visit (INDEPENDENT_AMBULATORY_CARE_PROVIDER_SITE_OTHER): Payer: Medicaid Other | Admitting: Women's Health

## 2019-01-01 ENCOUNTER — Other Ambulatory Visit: Payer: Self-pay

## 2019-01-01 ENCOUNTER — Encounter: Payer: Self-pay | Admitting: Women's Health

## 2019-01-01 VITALS — BP 124/81 | HR 95

## 2019-01-01 DIAGNOSIS — Z3A35 35 weeks gestation of pregnancy: Secondary | ICD-10-CM

## 2019-01-01 DIAGNOSIS — Z3483 Encounter for supervision of other normal pregnancy, third trimester: Secondary | ICD-10-CM

## 2019-01-01 NOTE — Patient Instructions (Signed)
Marissa Friedman, I greatly value your feedback.  If you receive a survey following your visit with [place] today, we appreciate you taking the time to fill it out.  Thanks, Knute Neu, CNM, [facility]  [place]!!! It is now [facility] ([place], [place] [address]) Entrance located off of [address] parking    Call the office [phone]) or go to [facility] if:  You begin to have strong, frequent contractions  Your water breaks.  Sometimes it is a big gush of fluid, sometimes it is just a trickle that keeps getting your panties wet or running down your legs  You have vaginal bleeding.  It is normal to have a small amount of spotting if your cervix was checked.   You don't feel your baby moving like normal.  If you don't, get you something to eat and drink and lay down and focus on feeling your baby move.  You should feel at least 10 movements in 2 hours.  If you don't, you should call the office or go to [place] [facility]   Your provider has recommended that you check your blood pressure (BP) at least once a week at home. If you do not have a blood pressure cuff at home, one will be provided for you. Contact your provider if you have not received your monitor within 1 week.   Helpful Tips for Accurate Home Blood Pressure Checks  . Don't smoke, exercise, or drink caffeine 30 minutes before checking your BP . Use the restroom before checking your BP (a full bladder can raise your pressure) . Relax in a comfortable upright chair . Feet on the ground . Left arm resting comfortably on a flat surface at the level of your heart . Legs uncrossed . Back supported . Sit quietly and don't talk . Place the cuff on your bare arm . Adjust snuggly, so that only two fingertips can fit between your skin and the top of the cuff . Check 2 readings separated by at least one  minute . Keep a log of your BP readings . For a visual, please reference this diagram: http://ccnc.care/bpdiagram  Provider Name: [facility]     Phone: [phone]  Zone 1: ALL CLEAR  Continue to monitor your symptoms:  . BP reading is less than 140 (top number) or less than 90 (bottom number)  . No right upper stomach pain . No headaches or seeing spots . No feeling nauseated or throwing up . No swelling in face and hands  Zone 2: CAUTION Call your doctor's office for any of the following:  . BP reading is greater than 140 (top number) or greater than 90 (bottom number)  . Stomach pain under your ribs in the middle or right side . Headaches or seeing spots . Feeling nauseated or throwing up . Swelling in face and hands  Zone 3: EMERGENCY  Seek immediate medical care if you have any of the following:  . BP reading is greater than160 (top number) or greater than 110 (bottom number) . Severe headaches not improving with Tylenol . Serious difficulty catching your breath . Any worsening symptoms from Zone 2

## 2019-01-01 NOTE — Progress Notes (Signed)
   TELEHEALTH VIRTUAL OBSTETRICS VISIT ENCOUNTER NOTE Patient name: Marissa Friedman MRN [id number]  Date of birth: September 14, 1995  I connected with patient on 01/01/19 at  2:00 PM EDT by [facility] and verified that I am speaking with the correct person using two identifiers. Due to COVID-19 recommendations, pt is not currently in our office.    I discussed the limitations, risks, security and privacy concerns of performing an evaluation and management service by telephone and the availability of in person appointments. I also discussed with the patient that there may be a patient responsible charge related to this service. The patient expressed understanding and agreed to proceed.  Chief Complaint:   Routine Prenatal Visit  History of Present Illness:   Marissa Friedman is a 23 y.o. [phone] female at [redacted]w[redacted]d with an Estimated Date of Delivery: 02/02/19 being evaluated today for ongoing management of a low-risk pregnancy.  Today she reports no complaints. Contractions: Not present. Vag. Bleeding: None.  Movement: Present. denies leaking of fluid. Review of Systems:   Pertinent items are noted in HPI Denies abnormal vaginal discharge w/ itching/odor/irritation, headaches, visual changes, shortness of breath, chest pain, abdominal pain, severe nausea/vomiting, or problems with urination or bowel movements unless otherwise stated above. Pertinent History Reviewed:  Reviewed past medical,surgical, social, obstetrical and family history.  Reviewed problem list, medications and allergies. Physical Assessment:   Vitals:   01/01/19 1355  BP: 124/81  Pulse: 95  There is no height or weight on file to calculate BMI.        Physical Examination:   General:  Alert, oriented and cooperative.   Mental Status: Normal mood and affect perceived. Normal judgment and thought content.  Rest of physical exam deferred due to type of encounter  No results found for this or any previous visit (from the past 24  hour(s)).  Assessment & Plan:  1) Pregnancy G4P3003 at [redacted]w[redacted]d with an Estimated Date of Delivery: 02/02/19   Meds: No orders of the defined types were placed in this encounter.  Labs/procedures today: none  Plan:  Continue routine obstetrical care.  Has home bp cuff.  Check bp weekly, let us know if >140/90.   Reviewed: Preterm labor symptoms and general obstetric precautions including but not limited to vaginal bleeding, contractions, leaking of fluid and fetal movement were reviewed in detail with the patient. The patient was advised to call back or seek an in-person office evaluation/go to [facility] for any urgent or concerning symptoms. All questions were answered. Please refer to After Visit Summary for other counseling recommendations.    I provided 10 minutes of non-face-to-face time during this encounter.  Follow-up: No follow-ups on file.  No orders of the defined types were placed in this encounter.  [place], [facility] 01/01/2019 2:05 PM

## 2019-01-05 ENCOUNTER — Other Ambulatory Visit: Payer: Self-pay | Admitting: Women's Health

## 2019-01-05 MED ORDER — PANTOPRAZOLE SODIUM 20 MG PO TBEC: 20.0000 mg | DELAYED_RELEASE_TABLET | Freq: Every day | ORAL | 3 refills | Status: DC

## 2019-01-09 ENCOUNTER — Other Ambulatory Visit: Payer: Self-pay

## 2019-01-09 ENCOUNTER — Ambulatory Visit (INDEPENDENT_AMBULATORY_CARE_PROVIDER_SITE_OTHER): Payer: Medicaid Other | Admitting: Women's Health

## 2019-01-09 ENCOUNTER — Encounter: Payer: Self-pay | Admitting: Women's Health

## 2019-01-09 VITALS — BP 127/80 | HR 90 | Wt 202.4 lb

## 2019-01-09 DIAGNOSIS — Z1389 Encounter for screening for other disorder: Secondary | ICD-10-CM

## 2019-01-09 DIAGNOSIS — Z331 Pregnant state, incidental: Secondary | ICD-10-CM

## 2019-01-09 DIAGNOSIS — Z3A36 36 weeks gestation of pregnancy: Secondary | ICD-10-CM

## 2019-01-09 DIAGNOSIS — Z3483 Encounter for supervision of other normal pregnancy, third trimester: Secondary | ICD-10-CM

## 2019-01-09 LAB — POCT URINALYSIS DIPSTICK OB
Blood, UA: NEGATIVE
Glucose, UA: NEGATIVE
Ketones, UA: NEGATIVE
Leukocytes, UA: NEGATIVE
Nitrite, UA: NEGATIVE
POC,PROTEIN,UA: NEGATIVE

## 2019-01-09 NOTE — Patient Instructions (Signed)
Marissa Friedman, I greatly value your feedback.  If you receive a survey following your visit with [place] today, we appreciate you taking the time to fill it out.  Thanks, Knute Neu, CNM, [facility]  [place]!!! It is now [place] at [facility] ([place], [place] [address]) Entrance located off of [address] parking    Call the office [phone]) or go to [facility] if:  You begin to have strong, frequent contractions  Your water breaks.  Sometimes it is a big gush of fluid, sometimes it is just a trickle that keeps getting your panties wet or running down your legs  You have vaginal bleeding.  It is normal to have a small amount of spotting if your cervix was checked.   You don't feel your baby moving like normal.  If you don't, get you something to eat and drink and lay down and focus on feeling your baby move.  You should feel at least 10 movements in 2 hours.  If you don't, you should call the office or go to [place] Blood Pressure Monitoring for Patients   Your provider has recommended that you check your blood pressure (BP) at least once a week at home. If you do not have a blood pressure cuff at home, one will be provided for you. Contact your provider if you have not received your monitor within 1 week.   Helpful Tips for Accurate Home Blood Pressure Checks   Don't smoke, exercise, or drink caffeine 30 minutes before checking your BP  Use the restroom before checking your BP (a full bladder can raise your pressure)  Relax in a comfortable upright chair  Feet on the ground  Left arm resting comfortably on a flat surface at the level of your heart  Legs uncrossed  Back supported  Sit quietly and don't talk  Place the cuff on your bare arm  Adjust snuggly, so that only two fingertips can fit between your skin and the top of the cuff  Check 2 readings separated by at least one  minute  Keep a log of your BP readings  For a visual, please reference this diagram: http://ccnc.care/bpdiagram  Provider Name: [facility]     Phone: [phone]  Zone 1: ALL CLEAR  Continue to monitor your symptoms:   BP reading is less than 140 (top number) or less than 90 (bottom number)   No right upper stomach pain  No headaches or seeing spots  No feeling nauseated or throwing up  No swelling in face and hands  Zone 2: CAUTION Call your doctor's office for any of the following:   BP reading is greater than 140 (top number) or greater than 90 (bottom number)   Stomach pain under your ribs in the middle or right side  Headaches or seeing spots  Feeling nauseated or throwing up  Swelling in face and hands  Zone 3: EMERGENCY  Seek immediate medical care if you have any of the following:   BP reading is greater than160 (top number) or greater than 110 (bottom number)  Severe headaches not improving with Tylenol  Serious difficulty catching your breath  Any worsening symptoms from Zone 2    Braxton Hicks Contractions Contractions of the uterus can occur throughout pregnancy, but they are not always a sign that you are in labor. You may have practice contractions called Braxton Hicks contractions. These  false labor contractions are sometimes confused with true labor. What are Deberah PeltonBraxton Hicks contractions? Braxton Hicks contractions are tightening movements that occur in the muscles of the uterus before labor. Unlike true labor contractions, these contractions do not result in opening (dilation) and thinning of the cervix. Toward the end of pregnancy (32-34 weeks), Braxton Hicks contractions can happen more often and may become stronger. These contractions are sometimes difficult to tell apart from true labor because they can be very uncomfortable. You should not feel embarrassed if you go to the hospital with false labor. Sometimes, the only way to tell if you  are in true labor is for your health care provider to look for changes in the cervix. The health care provider will do a physical exam and may monitor your contractions. If you are not in true labor, the exam should show that your cervix is not dilating and your water has not broken. If there are no other health problems associated with your pregnancy, it is completely safe for you to be sent home with false labor. You may continue to have Braxton Hicks contractions until you go into true labor. How to tell the difference between true labor and false labor True labor  Contractions last 30-70 seconds.  Contractions become very regular.  Discomfort is usually felt in the top of the uterus, and it spreads to the lower abdomen and low back.  Contractions do not go away with walking.  Contractions usually become more intense and increase in frequency.  The cervix dilates and gets thinner. False labor  Contractions are usually shorter and not as strong as true labor contractions.  Contractions are usually irregular.  Contractions are often felt in the front of the lower abdomen and in the groin.  Contractions may go away when you walk around or change positions while lying down.  Contractions get weaker and are shorter-lasting as time goes on.  The cervix usually does not dilate or become thin. Follow these instructions at home:   Take over-the-counter and prescription medicines only as told by your health care provider.  Keep up with your usual exercises and follow other instructions from your health care provider.  Eat and drink lightly if you think you are going into labor.  If Braxton Hicks contractions are making you uncomfortable: ? Change your position from lying down or resting to walking, or change from walking to resting. ? Sit and rest in a tub of warm water. ? Drink enough fluid to keep your urine pale yellow. Dehydration may cause these contractions. ? Do slow and  deep breathing several times an hour.  Keep all follow-up prenatal visits as told by your health care provider. This is important. Contact a health care provider if:  You have a fever.  You have continuous pain in your abdomen. Get help right away if:  Your contractions become stronger, more regular, and closer together.  You have fluid leaking or gushing from your vagina.  You pass blood-tinged mucus (bloody show).  You have bleeding from your vagina.  You have low back pain that you never had before.  You feel your babys head pushing down and causing pelvic pressure.  Your baby is not moving inside you as much as it used to. Summary  Contractions that occur before labor are called Braxton Hicks contractions, false labor, or practice contractions.  Braxton Hicks contractions are usually shorter, weaker, farther apart, and less regular than true labor contractions. True labor contractions usually become progressively  stronger and regular, and they become more frequent.  Manage discomfort from [facility] contractions by changing position, resting in a warm bath, drinking plenty of water, or practicing deep breathing. This information is not intended to replace advice given to you by your health care provider. Make sure you discuss any questions you have with your health care provider. Document Released: 11/01/2016 Document Revised: 05/31/2017 Document Reviewed: 11/01/2016 Elsevier Patient Education  2020 [employer]

## 2019-01-09 NOTE — Progress Notes (Signed)
   LOW-RISK PREGNANCY VISIT Patient name: Marissa Friedman MRN [id number]  Date of birth: February 15, 1996 Chief Complaint:   Routine Prenatal Visit  History of Present Illness:   Marissa Friedman is a 23 y.o. [phone] female at [redacted]w[redacted]d with an Estimated Date of Delivery: 02/02/19 being seen today for ongoing management of a low-risk pregnancy.  Today she reports no complaints. Contractions: Not present. Vag. Bleeding: None.  Movement: Present. denies leaking of fluid. Review of Systems:   Pertinent items are noted in HPI Denies abnormal vaginal discharge w/ itching/odor/irritation, headaches, visual changes, shortness of breath, chest pain, abdominal pain, severe nausea/vomiting, or problems with urination or bowel movements unless otherwise stated above. Pertinent History Reviewed:  Reviewed past medical,surgical, social, obstetrical and family history.  Reviewed problem list, medications and allergies. Physical Assessment:   Vitals:   01/09/19 1253  BP: 127/80  Pulse: 90  Weight: 202 lb 6.4 oz (91.8 kg)  Body mass index is 34.74 kg/m.        Physical Examination:   General appearance: Well appearing, and in no distress  Mental status: Alert, oriented to person, place, and time  Skin: Warm & dry  Cardiovascular: Normal heart rate noted  Respiratory: Normal respiratory effort, no distress  Abdomen: Soft, gravid, nontender  Pelvic: Cervical exam performed  Dilation: Fingertip Effacement (%): Thick Station: Ballotable  Extremities: Edema: None  Fetal Status: Fetal Heart Rate (bpm): 148 Fundal Height: 36 cm Movement: Present Presentation: Vertex  Results for orders placed or performed in visit on 01/09/19 (from the past 24 hour(s))  POC Urinalysis Dipstick OB   Collection Time: 01/09/19 12:57 PM  Result Value Ref Range   Color, UA     Clarity, UA     Glucose, UA Negative Negative   Bilirubin, UA     Ketones, UA neg    Spec Grav, UA     Blood, UA neg    pH, UA     POC,PROTEIN,UA  Negative Negative, Trace, Small (1+), Moderate (2+), Large (3+), 4+   Urobilinogen, UA     Nitrite, UA neg    Leukocytes, UA Negative Negative   Appearance     Odor      Assessment & Plan:  1) Low-risk pregnancy [id number] at [redacted]w[redacted]d with an Estimated Date of Delivery: 02/02/19    Meds: No orders of the defined types were placed in this encounter.  Labs/procedures today: gbs, gc/ct, sve  Plan:  Continue routine obstetrical care   Reviewed: Preterm labor symptoms and general obstetric precautions including but not limited to vaginal bleeding, contractions, leaking of fluid and fetal movement were reviewed in detail with the patient.  All questions were answered. Has home bp cuff. Check bp weekly, let us know if >140/90.   Follow-up: Return in about 1 week (around 01/16/2019) for [place], [place].  Orders Placed This Encounter  Procedures  . GC/Chlamydia Probe Amp  . Culture, beta strep (group b only)  . POC Urinalysis Dipstick OB   Roma Schanz CNM, [facility] 01/09/2019 1:11 PM

## 2019-01-13 LAB — CULTURE, BETA STREP (GROUP B ONLY): Strep Gp B Culture: NEGATIVE

## 2019-01-15 ENCOUNTER — Encounter: Payer: Self-pay | Admitting: *Deleted

## 2019-01-15 LAB — GC/CHLAMYDIA PROBE AMP
Chlamydia trachomatis, NAA: NEGATIVE
Neisseria Gonorrhoeae by PCR: NEGATIVE

## 2019-01-16 ENCOUNTER — Telehealth (INDEPENDENT_AMBULATORY_CARE_PROVIDER_SITE_OTHER): Payer: Medicaid Other | Admitting: Obstetrics & Gynecology

## 2019-01-16 ENCOUNTER — Other Ambulatory Visit: Payer: Self-pay

## 2019-01-16 VITALS — BP 127/81 | HR 78

## 2019-01-16 DIAGNOSIS — Z3483 Encounter for supervision of other normal pregnancy, third trimester: Secondary | ICD-10-CM

## 2019-01-16 DIAGNOSIS — Z3A37 37 weeks gestation of pregnancy: Secondary | ICD-10-CM | POA: Diagnosis not present

## 2019-01-16 NOTE — Progress Notes (Signed)
   TELEHEALTH VIRTUAL OBSTETRICS VISIT ENCOUNTER NOTE  I connected with Marissa Friedman on 01/16/19 at 12:45 PM EDT by telephone at home and verified that I am speaking with the correct person using two identifiers.   I discussed the limitations, risks, security and privacy concerns of performing an evaluation and management service by telephone and the availability of in person appointments. I also discussed with the patient that there may be a patient responsible charge related to this service. The patient expressed understanding and agreed to proceed.  Subjective:  Marissa Friedman is a 23 y.o. [phone] at [redacted]w[redacted]d being followed for ongoing prenatal care.  She is currently monitored for the following issues for this low-risk pregnancy and has Rubella non-immune status, antepartum; Short interval between pregnancies affecting pregnancy in first trimester, antepartum; Supervision of normal pregnancy; Late prenatal care in second trimester; and Asymptomatic bacteriuria during pregnancy in second trimester on their problem list.  Patient reports no complaints. Reports fetal movement. Denies any contractions, bleeding or leaking of fluid.   The following portions of the patient's history were reviewed and updated as appropriate: allergies, current medications, past family history, past medical history, past social history, past surgical history and problem list.   Objective:   General:  Alert, oriented and cooperative.   Mental Status: Normal mood and affect perceived. Normal judgment and thought content.  Rest of physical exam deferred due to type of encounter  Assessment and Plan:  Pregnancy: G4P3003 at [redacted]w[redacted]d There are no diagnoses linked to this encounter. Term labor symptoms and general obstetric precautions including but not limited to vaginal bleeding, contractions, leaking of fluid and fetal movement were reviewed in detail with the patient.  I discussed the assessment and treatment  plan with the patient. The patient was provided an opportunity to ask questions and all were answered. The patient agreed with the plan and demonstrated an understanding of the instructions. The patient was advised to call back or seek an in-person office evaluation/go to MAU at [facility] for any urgent or concerning symptoms. Please refer to After Visit Summary for other counseling recommendations.   I provided 11 minutes of non-face-to-face time during this encounter.  Return in about 1 week (around 01/23/2019) for [place].  Future Appointments  Date Time Provider [place]  01/16/2019 12:45 PM [place], Mertie Clause, MD [facility]    Florian Buff, MD Center for [facility], [place]

## 2019-01-20 ENCOUNTER — Ambulatory Visit (INDEPENDENT_AMBULATORY_CARE_PROVIDER_SITE_OTHER): Payer: Medicaid Other | Admitting: Women's Health

## 2019-01-20 ENCOUNTER — Other Ambulatory Visit: Payer: Self-pay

## 2019-01-20 ENCOUNTER — Encounter: Payer: Self-pay | Admitting: Women's Health

## 2019-01-20 VITALS — BP 113/81 | HR 97

## 2019-01-20 DIAGNOSIS — Z3483 Encounter for supervision of other normal pregnancy, third trimester: Secondary | ICD-10-CM

## 2019-01-20 DIAGNOSIS — Z3A38 38 weeks gestation of pregnancy: Secondary | ICD-10-CM

## 2019-01-20 NOTE — Progress Notes (Signed)
   TELEHEALTH VIRTUAL OBSTETRICS VISIT ENCOUNTER NOTE Patient name: Marissa Friedman MRN [id number]  Date of birth: 03-05-1996  I connected with patient on 01/20/19 at  2:15 PM EDT by [facility] and verified that I am speaking with the correct person using two identifiers. Due to COVID-19 recommendations, pt is not currently in our office.    I discussed the limitations, risks, security and privacy concerns of performing an evaluation and management service by telephone and the availability of in person appointments. I also discussed with the patient that there may be a patient responsible charge related to this service. The patient expressed understanding and agreed to proceed.  Chief Complaint:   Routine Prenatal Visit  History of Present Illness:   Marissa Friedman is a 23 y.o. [phone] female at [redacted]w[redacted]d with an Estimated Date of Delivery: 02/02/19 being evaluated today for ongoing management of a low-risk pregnancy.  Today she reports no complaints. Contractions: Not present. Vag. Bleeding: None.  Movement: Present. denies leaking of fluid. Review of Systems:   Pertinent items are noted in HPI Denies abnormal vaginal discharge w/ itching/odor/irritation, headaches, visual changes, shortness of breath, chest pain, abdominal pain, severe nausea/vomiting, or problems with urination or bowel movements unless otherwise stated above. Pertinent History Reviewed:  Reviewed past medical,surgical, social, obstetrical and family history.  Reviewed problem list, medications and allergies. Physical Assessment:   Vitals:   01/20/19 1121  BP: 113/81  Pulse: 97  There is no height or weight on file to calculate BMI.        Physical Examination:   General:  Alert, oriented and cooperative.   Mental Status: Normal mood and affect perceived. Normal judgment and thought content.  Rest of physical exam deferred due to type of encounter  No results found for this or any previous visit (from the past 24  hour(s)).  Assessment & Plan:  1) Pregnancy G4P3003 at [redacted]w[redacted]d with an Estimated Date of Delivery: 02/02/19    Meds: No orders of the defined types were placed in this encounter.   Labs/procedures today: none  Plan:  Continue routine obstetrical care.  Has home bp cuff.  Check bp weekly, let us know if >140/90.   Reviewed: Term labor symptoms and general obstetric precautions including but not limited to vaginal bleeding, contractions, leaking of fluid and fetal movement were reviewed in detail with the patient. The patient was advised to call back or seek an in-person office evaluation/go to [facility] at [facility] for any urgent or concerning symptoms. All questions were answered. Please refer to After Visit Summary for other counseling recommendations.    I provided 10 minutes of non-face-to-face time during this encounter.  Follow-up: Return in about 1 week (around 01/27/2019) for [place], in person. wants membrane sweep if possible  No orders of the defined types were placed in this encounter.  [place], [facility] 01/20/2019 11:41 AM

## 2019-01-20 NOTE — Patient Instructions (Signed)
Marissa Friedman, I greatly value your feedback.  If you receive a survey following your visit with [place] today, we appreciate you taking the time to fill it out.  Thanks, Knute Neu, CNM, [facility]  [place]!!! It is now [place] at [facility] ([place], [place] [address]) Entrance located off of [address] parking   Go to [email or web address] to register for FREE online childbirth classes    Call the office [phone]) or go to [facility] if:  You begin to have strong, frequent contractions  Your water breaks.  Sometimes it is a big gush of fluid, sometimes it is just a trickle that keeps getting your panties wet or running down your legs  You have vaginal bleeding.  It is normal to have a small amount of spotting if your cervix was checked.   You don't feel your baby moving like normal.  If you don't, get you something to eat and drink and lay down and focus on feeling your baby move.  You should feel at least 10 movements in 2 hours.  If you don't, you should call the office or go to [place] Blood Pressure Monitoring for Patients   Your provider has recommended that you check your blood pressure (BP) at least once a week at home. If you do not have a blood pressure cuff at home, one will be provided for you. Contact your provider if you have not received your monitor within 1 week.   Helpful Tips for Accurate Home Blood Pressure Checks   Don't smoke, exercise, or drink caffeine 30 minutes before checking your BP  Use the restroom before checking your BP (a full bladder can raise your pressure)  Relax in a comfortable upright chair  Feet on the ground  Left arm resting comfortably on a flat surface at the level of your heart  Legs uncrossed  Back supported  Sit quietly and don't talk  Place the cuff on your bare arm  Adjust snuggly, so that only two fingertips can fit between your skin  and the top of the cuff  Check 2 readings separated by at least one minute  Keep a log of your BP readings  For a visual, please reference this diagram: http://ccnc.care/bpdiagram  Provider Name: [facility]     Phone: [phone]  Zone 1: ALL CLEAR  Continue to monitor your symptoms:   BP reading is less than 140 (top number) or less than 90 (bottom number)   No right upper stomach pain  No headaches or seeing spots  No feeling nauseated or throwing up  No swelling in face and hands  Zone 2: CAUTION Call your doctor's office for any of the following:   BP reading is greater than 140 (top number) or greater than 90 (bottom number)   Stomach pain under your ribs in the middle or right side  Headaches or seeing spots  Feeling nauseated or throwing up  Swelling in face and hands  Zone 3: EMERGENCY  Seek immediate medical care if you have any of the following:   BP reading is greater than160 (top number) or greater than 110 (bottom number)  Severe headaches not improving with Tylenol  Serious difficulty catching your breath Any worsening symptoms from Zone 2    Braxton Hicks Contractions Contractions of the uterus can occur throughout pregnancy, but they are not always a sign that you are in  labor. You may have practice contractions called Braxton Hicks contractions. These false labor contractions are sometimes confused with true labor. What are [place] contractions? Braxton Hicks contractions are tightening movements that occur in the muscles of the uterus before labor. Unlike true labor contractions, these contractions do not result in opening (dilation) and thinning of the cervix. Toward the end of pregnancy (32-34 weeks), Braxton Hicks contractions can happen more often and may become stronger. These contractions are sometimes difficult to tell apart from true labor because they can be very uncomfortable. You should not feel embarrassed if you go to the  hospital with false labor. Sometimes, the only way to tell if you are in true labor is for your health care provider to look for changes in the cervix. The health care provider will do a physical exam and may monitor your contractions. If you are not in true labor, the exam should show that your cervix is not dilating and your water has not broken. If there are no other health problems associated with your pregnancy, it is completely safe for you to be sent home with false labor. You may continue to have Braxton Hicks contractions until you go into true labor. How to tell the difference between true labor and false labor True labor  Contractions last 30-70 seconds.  Contractions become very regular.  Discomfort is usually felt in the top of the uterus, and it spreads to the lower abdomen and low back.  Contractions do not go away with walking.  Contractions usually become more intense and increase in frequency.  The cervix dilates and gets thinner. False labor  Contractions are usually shorter and not as strong as true labor contractions.  Contractions are usually irregular.  Contractions are often felt in the front of the lower abdomen and in the groin.  Contractions may go away when you walk around or change positions while lying down.  Contractions get weaker and are shorter-lasting as time goes on.  The cervix usually does not dilate or become thin. Follow these instructions at home:   Take over-the-counter and prescription medicines only as told by your health care provider.  Keep up with your usual exercises and follow other instructions from your health care provider.  Eat and drink lightly if you think you are going into labor.  If Braxton Hicks contractions are making you uncomfortable: ? Change your position from lying down or resting to walking, or change from walking to resting. ? Sit and rest in a tub of warm water. ? Drink enough fluid to keep your urine pale  yellow. Dehydration may cause these contractions. ? Do slow and deep breathing several times an hour.  Keep all follow-up prenatal visits as told by your health care provider. This is important. Contact a health care provider if:  You have a fever.  You have continuous pain in your abdomen. Get help right away if:  Your contractions become stronger, more regular, and closer together.  You have fluid leaking or gushing from your vagina.  You pass blood-tinged mucus (bloody show).  You have bleeding from your vagina.  You have low back pain that you never had before.  You feel your babys head pushing down and causing pelvic pressure.  Your baby is not moving inside you as much as it used to. Summary  Contractions that occur before labor are called Braxton Hicks contractions, false labor, or practice contractions.  Braxton Hicks contractions are usually shorter, weaker, farther apart, and less  regular than true labor contractions. True labor contractions usually become progressively stronger and regular, and they become more frequent.  Manage discomfort from [facility] contractions by changing position, resting in a warm bath, drinking plenty of water, or practicing deep breathing. This information is not intended to replace advice given to you by your health care provider. Make sure you discuss any questions you have with your health care provider. Document Released: 11/01/2016 Document Revised: 05/31/2017 Document Reviewed: 11/01/2016 Elsevier Patient Education  2020 [employer].

## 2019-01-27 ENCOUNTER — Ambulatory Visit (INDEPENDENT_AMBULATORY_CARE_PROVIDER_SITE_OTHER): Payer: Medicaid Other | Admitting: Obstetrics & Gynecology

## 2019-01-27 ENCOUNTER — Other Ambulatory Visit: Payer: Self-pay

## 2019-01-27 ENCOUNTER — Encounter: Payer: Self-pay | Admitting: Obstetrics & Gynecology

## 2019-01-27 VITALS — BP 105/76 | HR 132 | Wt 205.0 lb

## 2019-01-27 DIAGNOSIS — Z3A39 39 weeks gestation of pregnancy: Secondary | ICD-10-CM

## 2019-01-27 DIAGNOSIS — Z1389 Encounter for screening for other disorder: Secondary | ICD-10-CM

## 2019-01-27 DIAGNOSIS — Z331 Pregnant state, incidental: Secondary | ICD-10-CM

## 2019-01-27 DIAGNOSIS — Z3483 Encounter for supervision of other normal pregnancy, third trimester: Secondary | ICD-10-CM

## 2019-01-27 LAB — POCT URINALYSIS DIPSTICK OB
Blood, UA: NEGATIVE
Glucose, UA: NEGATIVE
Ketones, UA: NEGATIVE
Leukocytes, UA: NEGATIVE
Nitrite, UA: NEGATIVE
POC,PROTEIN,UA: NEGATIVE

## 2019-01-27 NOTE — Progress Notes (Signed)
   LOW-RISK PREGNANCY VISIT Patient name: Marissa Friedman MRN [id number]  Date of birth: [birth year] Chief Complaint:   Routine Prenatal Visit  History of Present Illness:   Marissa Friedman is a 23 y.o. [phone] female at [redacted]w[redacted]d with an Estimated Date of Delivery: 02/02/19 being seen today for ongoing management of a low-risk pregnancy.  Today she reports no complaints. Contractions: Not present.  .  Movement: Present. denies leaking of fluid. Review of Systems:   Pertinent items are noted in HPI Denies abnormal vaginal discharge w/ itching/odor/irritation, headaches, visual changes, shortness of breath, chest pain, abdominal pain, severe nausea/vomiting, or problems with urination or bowel movements unless otherwise stated above. Pertinent History Reviewed:  Reviewed past medical,surgical, social, obstetrical and family history.  Reviewed problem list, medications and allergies. Physical Assessment:   Vitals:   01/27/19 1215  BP: 105/76  Pulse: (!) 132  Weight: 205 lb (93 kg)  Body mass index is 35.19 kg/m.        Physical Examination:   General appearance: Well appearing, and in no distress  Mental status: Alert, oriented to person, place, and time  Skin: Warm & dry  Cardiovascular: Normal heart rate noted  Respiratory: Normal respiratory effort, no distress  Abdomen: Soft, gravid, nontender  Pelvic: Cervical exam performed  Dilation: 1 Effacement (%): Thick Station: Ballotable  Extremities: Edema: None  Fetal Status: Fetal Heart Rate (bpm): 160 Fundal Height: 40 cm Movement: Present Presentation: Vertex  Results for orders placed or performed in visit on 01/27/19 (from the past 24 hour(s))  POC Urinalysis Dipstick OB   Collection Time: 01/27/19 12:16 PM  Result Value Ref Range   Color, UA     Clarity, UA     Glucose, UA Negative Negative   Bilirubin, UA     Ketones, UA neg    Spec Grav, UA     Blood, UA neg    pH, UA     POC,PROTEIN,UA Negative Negative, Trace,  Small (1+), Moderate (2+), Large (3+), 4+   Urobilinogen, UA     Nitrite, UA neg    Leukocytes, UA Negative Negative   Appearance     Odor      Assessment & Plan:  1) Low-risk pregnancy [id number] at [redacted]w[redacted]d with an Estimated Date of Delivery: 02/02/19      Meds: No orders of the defined types were placed in this encounter.  Labs/procedures today:   Plan:  Continue routine obstetrical care   Reviewed:  labor symptoms and general obstetric precautions including but not limited to vaginal bleeding, contractions, leaking of fluid and fetal movement were reviewed in detail with the patient.  All questions were answered.  home bp cuff. Rx faxed to . Check bp weekly, let us know if >140/90.   Follow-up: Return in about 1 week (around 02/03/2019) for NST, LROB.  Orders Placed This Encounter  Procedures  . POC Urinalysis Dipstick OB   Florian Buff  01/27/2019 12:31 PM

## 2019-02-03 ENCOUNTER — Encounter (HOSPITAL_COMMUNITY): Payer: Self-pay | Admitting: *Deleted

## 2019-02-03 ENCOUNTER — Encounter: Payer: Self-pay | Admitting: Women's Health

## 2019-02-03 ENCOUNTER — Other Ambulatory Visit: Payer: Self-pay

## 2019-02-03 ENCOUNTER — Ambulatory Visit (INDEPENDENT_AMBULATORY_CARE_PROVIDER_SITE_OTHER): Payer: Medicaid Other | Admitting: Women's Health

## 2019-02-03 ENCOUNTER — Telehealth (HOSPITAL_COMMUNITY): Payer: Self-pay | Admitting: *Deleted

## 2019-02-03 VITALS — BP 129/86 | HR 127 | Wt 204.0 lb

## 2019-02-03 DIAGNOSIS — O480 Post-term pregnancy: Secondary | ICD-10-CM

## 2019-02-03 DIAGNOSIS — O0932 Supervision of pregnancy with insufficient antenatal care, second trimester: Secondary | ICD-10-CM | POA: Diagnosis not present

## 2019-02-03 DIAGNOSIS — Z3483 Encounter for supervision of other normal pregnancy, third trimester: Secondary | ICD-10-CM

## 2019-02-03 DIAGNOSIS — Z3A40 40 weeks gestation of pregnancy: Secondary | ICD-10-CM

## 2019-02-03 NOTE — Patient Instructions (Signed)
Marissa Friedman, I greatly value your feedback.  If you receive a survey following your visit with us today, we appreciate you taking the time to fill it out.  Thanks, [name], CNM, [facility]NP-BC  [facility] now [facility] ([address] [place], [place] [address]) Entrance located off of [address] Free 24/7 valet parking   Go to [email or web address] to register for FREE online childbirth classes    Call the office [phone]) or go to [facility] if:  You begin to have strong, frequent contractions  Your water breaks.  Sometimes it is a big gush of fluid, sometimes it is just a trickle that keeps getting your panties wet or running down your legs  You have vaginal bleeding.  It is normal to have a small amount of spotting if your cervix was checked.   You don't feel your baby moving like normal.  If you don't, get you something to eat and drink and lay down and focus on feeling your baby move.  You should feel at least 10 movements in 2 hours.  If you don't, you should call the office or go to [facility].   Home Blood Pressure Monitoring for Patients   Your provider has recommended that you check your blood pressure (BP) at least once a week at home. If you do not have a blood pressure cuff at home, one will be provided for you. Contact your provider if you have not received your monitor within 1 week.   Helpful Tips for Accurate Home Blood Pressure Checks  . Don't smoke, exercise, or drink caffeine 30 minutes before checking your BP . Use the restroom before checking your BP (a full bladder can raise your pressure) . Relax in a comfortable upright chair . Feet on the ground . Left arm resting comfortably on a flat surface at the level of your heart . Legs uncrossed . Back supported . Sit quietly and don't talk . Place the cuff on your bare arm . Adjust snuggly, so that only two fingertips can fit between your skin  and the top of the cuff . Check 2 readings separated by at least one minute . Keep a log of your BP readings . For a visual, please reference this diagram: http://ccnc.care/bpdiagram  Provider Name: [facility]     Phone: [phone]  Zone 1: ALL CLEAR  Continue to monitor your symptoms:  . BP reading is less than 140 (top number) or less than 90 (bottom number)  . No right upper stomach pain . No headaches or seeing spots . No feeling nauseated or throwing up . No swelling in face and hands  Zone 2: CAUTION Call your doctor's office for any of the following:  . BP reading is greater than 140 (top number) or greater than 90 (bottom number)  . Stomach pain under your ribs in the middle or right side . Headaches or seeing spots . Feeling nauseated or throwing up . Swelling in face and hands  Zone 3: EMERGENCY  Seek immediate medical care if you have any of the following:  . BP reading is greater than160 (top number) or greater than 110 (bottom number) . Severe headaches not improving with Tylenol . Serious difficulty catching your breath . Any worsening symptoms from Zone 2    Braxton Hicks Contractions Contractions of the uterus can occur throughout pregnancy, but they are not always a sign that you are  in labor. You may have practice contractions called Braxton Hicks contractions. These false labor contractions are sometimes confused with true labor. What are [place] contractions? Braxton Hicks contractions are tightening movements that occur in the muscles of the uterus before labor. Unlike true labor contractions, these contractions do not result in opening (dilation) and thinning of the cervix. Toward the end of pregnancy (32-34 weeks), Braxton Hicks contractions can happen more often and may become stronger. These contractions are sometimes difficult to tell apart from true labor because they can be very uncomfortable. You should not feel embarrassed if you go to  the hospital with false labor. Sometimes, the only way to tell if you are in true labor is for your health care provider to look for changes in the cervix. The health care provider will do a physical exam and may monitor your contractions. If you are not in true labor, the exam should show that your cervix is not dilating and your water has not broken. If there are no other health problems associated with your pregnancy, it is completely safe for you to be sent home with false labor. You may continue to have Braxton Hicks contractions until you go into true labor. How to tell the difference between true labor and false labor True labor  Contractions last 30-70 seconds.  Contractions become very regular.  Discomfort is usually felt in the top of the uterus, and it spreads to the lower abdomen and low back.  Contractions do not go away with walking.  Contractions usually become more intense and increase in frequency.  The cervix dilates and gets thinner. False labor  Contractions are usually shorter and not as strong as true labor contractions.  Contractions are usually irregular.  Contractions are often felt in the front of the lower abdomen and in the groin.  Contractions may go away when you walk around or change positions while lying down.  Contractions get weaker and are shorter-lasting as time goes on.  The cervix usually does not dilate or become thin. Follow these instructions at home:   Take over-the-counter and prescription medicines only as told by your health care provider.  Keep up with your usual exercises and follow other instructions from your health care provider.  Eat and drink lightly if you think you are going into labor.  If Braxton Hicks contractions are making you uncomfortable: ? Change your position from lying down or resting to walking, or change from walking to resting. ? Sit and rest in a tub of warm water. ? Drink enough fluid to keep your urine  pale yellow. Dehydration may cause these contractions. ? Do slow and deep breathing several times an hour.  Keep all follow-up prenatal visits as told by your health care provider. This is important. Contact a health care provider if:  You have a fever.  You have continuous pain in your abdomen. Get help right away if:  Your contractions become stronger, more regular, and closer together.  You have fluid leaking or gushing from your vagina.  You pass blood-tinged mucus (bloody show).  You have bleeding from your vagina.  You have low back pain that you never had before.  You feel your baby's head pushing down and causing pelvic pressure.  Your baby is not moving inside you as much as it used to. Summary  Contractions that occur before labor are called Braxton Hicks contractions, false labor, or practice contractions.  Braxton Hicks contractions are usually shorter, weaker, farther apart, and  less regular than true labor contractions. True labor contractions usually become progressively stronger and regular, and they become more frequent.  Manage discomfort from [facility] contractions by changing position, resting in a warm bath, drinking plenty of water, or practicing deep breathing. This information is not intended to replace advice given to you by your health care provider. Make sure you discuss any questions you have with your health care provider. Document Released: 11/01/2016 Document Revised: 05/31/2017 Document Reviewed: 11/01/2016 Elsevier Patient Education  [place] INDUCTION OF LABOR:  Information Sheet for Mothers and Family               What's a Foley Bulb Induction? A Foley bulb induction is a procedure where your provider inserts a catheter into your cervix. Once inside your womb, your provider inflates the balloon with a saline solution.   This puts pressure on your cervix and encourages dilation. The catheter falls out  once your cervix dilates to 3-4 centimeters.     With any procedure, it's important that you know what to expect. The insertion of a Foley catheter can be a bit uncomfortable, and some women experience sharp pelvic pain. The pain may subside once the catheter is in place. You may experience some cramping when the Foley catheter is in place.  This is normal.     GO TO THE MATERNITY ADMISSIONS UNIT FOR THE FOLLOWING:  Heavy vaginal bleeding  Rupture of membranes (fluid that wets your underwear)  Painful uterine contractions every 5 minutes or less  Severe abdominal discomfort  Decreased movement of the baby

## 2019-02-03 NOTE — Telephone Encounter (Signed)
Preadmission screen  

## 2019-02-03 NOTE — Progress Notes (Signed)
   LOW-RISK PREGNANCY VISIT Patient name: Marissa Friedman MRN [id number]  Date of birth: [birth year] Chief Complaint:   Routine Prenatal Visit  History of Present Illness:   Marissa Friedman is a 23 y.o. [phone] female at [redacted]w[redacted]d with an Estimated Date of Delivery: 02/02/19 being seen today for ongoing management of a low-risk pregnancy.  Today she reports no complaints. Contractions: Not present. Vag. Bleeding: None.  Movement: Present. denies leaking of fluid. Review of Systems:   Pertinent items are noted in HPI Denies abnormal vaginal discharge w/ itching/odor/irritation, headaches, visual changes, shortness of breath, chest pain, abdominal pain, severe nausea/vomiting, or problems with urination or bowel movements unless otherwise stated above. Pertinent History Reviewed:  Reviewed past medical,surgical, social, obstetrical and family history.  Reviewed problem list, medications and allergies. Physical Assessment:   Vitals:   02/03/19 0935  BP: 129/86  Pulse: (!) 127  Weight: 204 lb (92.5 kg)  Body mass index is 35.02 kg/m.        Physical Examination:   General appearance: Well appearing, and in no distress  Mental status: Alert, oriented to person, place, and time  Skin: Warm & dry  Cardiovascular: Normal heart rate noted  Respiratory: Normal respiratory effort, no distress  Abdomen: Soft, gravid, nontender  Pelvic: Cervical exam performed  Dilation: 2 Effacement (%): Thick Station: Ballotable  Extremities: Edema: None  Fetal Status: Fetal Heart Rate (bpm): 120 Fundal Height: 38 cm Movement: Present Presentation: Vertex  NST: FHR baseline 120 bpm, Variability: moderate, Accelerations:present, Decelerations:  Absent= Cat 1/Reactive Toco: none    No results found for this or any previous visit (from the past 24 hour(s)).  Assessment & Plan:  1) Low-risk pregnancy [id number] at [redacted]w[redacted]d with an Estimated Date of Delivery: 02/02/19   2) Postdates, reactive NST, IOL scheduled for  8/11 @ 0730 (nothing available 8/10), will bring to office 8/10 pm for FB insertion/NST.  IOL form faxed via Epic and orders placed    Meds: No orders of the defined types were placed in this encounter.  Labs/procedures today: sve, nst  Plan:  Continue routine obstetrical care   Reviewed: Term labor symptoms and general obstetric precautions including but not limited to vaginal bleeding, contractions, leaking of fluid and fetal movement were reviewed in detail with the patient.  All questions were answered.   Follow-up: Return in about 6 days (around 02/09/2019) for afternoon for NST and foley bulb insertion.  No orders of the defined types were placed in this encounter.  [place], [facility] 02/03/2019 10:34 AM

## 2019-02-03 NOTE — Treatment Plan (Signed)
Induction Assessment Scheduling Form Fax to [facility]:  [phone]  Marissa Friedman                                                                                   DOB:  04-24-1996                                                            MRN:  [id number]                                                                     Phone #:     [phone] (Home Phone)                   Provider:  [facility]  GP:  [phone]                                                            Estimated Date of Delivery: 02/02/19  Dating Criteria: 11wk u/s    Medical Indications for induction:  postdates Admission Date/Time:  8/11 @ 0730 Gestational age on admission:  76.1   Filed Weights   02/03/19 0935  Weight: 204 lb (92.5 kg)   HIV:  Non Reactive (05/04 0902) GBS:  neg  2/th/ballotable, vtx   Method of induction(proposed):  Outpatient FB 8/10, pit on arrival   Scheduling Provider Signature:  Roma Schanz, CNM                                            Today's Date:  02/03/2019

## 2019-02-08 ENCOUNTER — Other Ambulatory Visit: Payer: Self-pay

## 2019-02-08 ENCOUNTER — Other Ambulatory Visit (HOSPITAL_COMMUNITY)
Admission: RE | Admit: 2019-02-08 | Discharge: 2019-02-08 | Disposition: A | Discharge status: Home / Self Care | Payer: Medicaid Other | Source: Ambulatory Visit | Attending: Family Medicine | Admitting: Family Medicine

## 2019-02-08 DIAGNOSIS — Z01812 Encounter for preprocedural laboratory examination: Secondary | ICD-10-CM | POA: Diagnosis not present

## 2019-02-08 DIAGNOSIS — Z20828 Contact with and (suspected) exposure to other viral communicable diseases: Secondary | ICD-10-CM | POA: Insufficient documentation

## 2019-02-08 LAB — SARS CORONAVIRUS 2 (TAT 6-24 HRS): SARS Coronavirus 2: NEGATIVE

## 2019-02-08 NOTE — MAU Note (Signed)
Pt here for PAT covid swab, denies any symptoms 

## 2019-02-09 ENCOUNTER — Other Ambulatory Visit: Payer: Self-pay

## 2019-02-09 ENCOUNTER — Inpatient Hospital Stay (HOSPITAL_COMMUNITY)
Admission: AD | Admit: 2019-02-09 | Discharge: 2019-02-11 | DRG: 807 | Disposition: A | Discharge status: Home / Self Care | Payer: Medicaid Other | Attending: Obstetrics and Gynecology | Admitting: Obstetrics and Gynecology

## 2019-02-09 ENCOUNTER — Encounter (HOSPITAL_COMMUNITY): Payer: Self-pay | Admitting: *Deleted

## 2019-02-09 ENCOUNTER — Ambulatory Visit (INDEPENDENT_AMBULATORY_CARE_PROVIDER_SITE_OTHER): Payer: Medicaid Other | Admitting: Obstetrics & Gynecology

## 2019-02-09 ENCOUNTER — Encounter: Payer: Self-pay | Admitting: Obstetrics & Gynecology

## 2019-02-09 VITALS — BP 114/76 | HR 109 | Wt 207.3 lb

## 2019-02-09 DIAGNOSIS — O480 Post-term pregnancy: Secondary | ICD-10-CM

## 2019-02-09 DIAGNOSIS — Z3A41 41 weeks gestation of pregnancy: Secondary | ICD-10-CM | POA: Diagnosis not present

## 2019-02-09 DIAGNOSIS — Z30017 Encounter for initial prescription of implantable subdermal contraceptive: Secondary | ICD-10-CM | POA: Diagnosis not present

## 2019-02-09 DIAGNOSIS — Z1389 Encounter for screening for other disorder: Secondary | ICD-10-CM

## 2019-02-09 DIAGNOSIS — Z331 Pregnant state, incidental: Secondary | ICD-10-CM

## 2019-02-09 DIAGNOSIS — O0932 Supervision of pregnancy with insufficient antenatal care, second trimester: Secondary | ICD-10-CM

## 2019-02-09 DIAGNOSIS — Z87891 Personal history of nicotine dependence: Secondary | ICD-10-CM | POA: Diagnosis not present

## 2019-02-09 DIAGNOSIS — Z3483 Encounter for supervision of other normal pregnancy, third trimester: Secondary | ICD-10-CM

## 2019-02-09 LAB — POCT URINALYSIS DIPSTICK OB
Blood, UA: NEGATIVE
Glucose, UA: NEGATIVE
Leukocytes, UA: NEGATIVE
Nitrite, UA: NEGATIVE

## 2019-02-09 MED ORDER — LACTATED RINGERS IV SOLN
500.0000 mL | Freq: Once | INTRAVENOUS | Status: AC
  Administered 2019-02-09: 04:00:00 500 mL via INTRAVENOUS

## 2019-02-09 MED ORDER — EPHEDRINE 5 MG/ML INJ: 10.0000 mg | INTRAVENOUS | Status: DC | PRN

## 2019-02-09 MED ORDER — FLEET ENEMA 7-19 GM/118ML RE ENEM: 1.0000 | ENEMA | RECTAL | Status: DC | PRN

## 2019-02-09 MED ORDER — LACTATED RINGERS IV SOLN
INTRAVENOUS | Status: DC
  Administered 2019-02-09: via INTRAVENOUS

## 2019-02-09 MED ORDER — LACTATED RINGERS IV SOLN: 500.0000 mL | INTRAVENOUS | Status: DC | PRN

## 2019-02-09 MED ORDER — FENTANYL-BUPIVACAINE-NACL 0.5-0.125-0.9 MG/250ML-% EP SOLN
12.0000 mL/h | EPIDURAL | Status: DC | PRN
  Filled 2019-02-09: qty 250

## 2019-02-09 MED ORDER — HYDROXYZINE HCL 50 MG PO TABS: 50.0000 mg | ORAL_TABLET | Freq: Four times a day (QID) | ORAL | Status: DC | PRN

## 2019-02-09 MED ORDER — DIPHENHYDRAMINE HCL 50 MG/ML IJ SOLN: 12.5000 mg | INTRAMUSCULAR | Status: DC | PRN

## 2019-02-09 MED ORDER — OXYCODONE-ACETAMINOPHEN 5-325 MG PO TABS: 2.0000 | ORAL_TABLET | ORAL | Status: DC | PRN

## 2019-02-09 MED ORDER — OXYCODONE-ACETAMINOPHEN 5-325 MG PO TABS: 1.0000 | ORAL_TABLET | ORAL | Status: DC | PRN

## 2019-02-09 MED ORDER — OXYTOCIN 40 UNITS IN NORMAL SALINE INFUSION - SIMPLE MED: 2.5000 [IU]/h | INTRAVENOUS | Status: DC

## 2019-02-09 MED ORDER — OXYTOCIN BOLUS FROM INFUSION
500.0000 mL | Freq: Once | INTRAVENOUS | Status: AC
  Administered 2019-02-10: 500 mL via INTRAVENOUS

## 2019-02-09 MED ORDER — ACETAMINOPHEN 325 MG PO TABS: 650.0000 mg | ORAL_TABLET | ORAL | Status: DC | PRN

## 2019-02-09 MED ORDER — ONDANSETRON HCL 4 MG/2ML IJ SOLN
4.0000 mg | Freq: Four times a day (QID) | INTRAMUSCULAR | Status: DC | PRN
  Administered 2019-02-10: 02:00:00 4 mg via INTRAVENOUS
  Filled 2019-02-09: qty 2

## 2019-02-09 MED ORDER — LIDOCAINE HCL (PF) 1 % IJ SOLN: 30.0000 mL | INTRAMUSCULAR | Status: DC | PRN

## 2019-02-09 MED ORDER — OXYTOCIN 40 UNITS IN NORMAL SALINE INFUSION - SIMPLE MED: 1.0000 m[IU]/min | INTRAVENOUS | Status: DC

## 2019-02-09 MED ORDER — PHENYLEPHRINE 40 MCG/ML (10ML) SYRINGE FOR IV PUSH (FOR BLOOD PRESSURE SUPPORT)
80.0000 ug | PREFILLED_SYRINGE | INTRAVENOUS | Status: DC | PRN
  Filled 2019-02-09: qty 10

## 2019-02-09 MED ORDER — PHENYLEPHRINE 40 MCG/ML (10ML) SYRINGE FOR IV PUSH (FOR BLOOD PRESSURE SUPPORT)
80.0000 ug | PREFILLED_SYRINGE | INTRAVENOUS | Status: DC | PRN
  Administered 2019-02-10: 01:00:00 80 ug via INTRAVENOUS

## 2019-02-09 MED ORDER — SOD CITRATE-CITRIC ACID 500-334 MG/5ML PO SOLN
30.0000 mL | ORAL | Status: DC | PRN
  Administered 2019-02-10: 30 mL via ORAL
  Filled 2019-02-09: qty 30

## 2019-02-09 MED ORDER — TERBUTALINE SULFATE 1 MG/ML IJ SOLN: 0.2500 mg | Freq: Once | INTRAMUSCULAR | Status: DC | PRN

## 2019-02-09 MED ORDER — OXYTOCIN 40 UNITS IN NORMAL SALINE INFUSION - SIMPLE MED
INTRAVENOUS | Status: AC
  Administered 2019-02-10: 04:00:00 500 mL via INTRAVENOUS
  Filled 2019-02-09: qty 1000

## 2019-02-09 MED ORDER — FENTANYL CITRATE (PF) 100 MCG/2ML IJ SOLN
50.0000 ug | INTRAMUSCULAR | Status: DC | PRN
  Filled 2019-02-09: qty 2

## 2019-02-09 NOTE — MAU Note (Signed)
Pt reports to MAU c/o ctx every 46min. No bleeding or LOF. +FM. Pt reports getting a foley bulb placed today and she is a IOL at 0730 02/10/19.

## 2019-02-09 NOTE — Progress Notes (Signed)
   LOW-RISK PREGNANCY VISIT Patient name: Marissa Friedman MRN [id number]  Date of birth: April 08, 1996 Chief Complaint:   Routine Prenatal Visit (NST/ foley bulb insertion)  History of Present Illness:   Marissa Friedman is a 23 y.o. [phone] female at [redacted]w[redacted]d with an Estimated Date of Delivery: 02/02/19 being seen today for ongoing management of a low-risk pregnancy.  Today she reports no complaints. Contractions: Not present.  .  Movement: Present. denies leaking of fluid. Review of Systems:   Pertinent items are noted in HPI Denies abnormal vaginal discharge w/ itching/odor/irritation, headaches, visual changes, shortness of breath, chest pain, abdominal pain, severe nausea/vomiting, or problems with urination or bowel movements unless otherwise stated above. Pertinent History Reviewed:  Reviewed past medical,surgical, social, obstetrical and family history.  Reviewed problem list, medications and allergies. Physical Assessment:   Vitals:   02/09/19 1438  BP: 114/76  Pulse: (!) 109  Weight: 207 lb 4.8 oz (94 kg)  Body mass index is 35.58 kg/m.        Physical Examination:   General appearance: Well appearing, and in no distress  Mental status: Alert, oriented to person, place, and time  Skin: Warm & dry  Cardiovascular: Normal heart rate noted  Respiratory: Normal respiratory effort, no distress  Abdomen: Soft, gravid, nontender  Pelvic: Cervical exam performed  Dilation: 2 Effacement (%): Thick Station: Ballotable  Foley bulb placed  Extremities: Edema: None  Fetal Status: Fetal Heart Rate (bpm): 135 Fundal Height: 38 cm Movement: Present Presentation: Vertex  Results for orders placed or performed in visit on 02/09/19 (from the past 24 hour(s))  POC Urinalysis Dipstick OB   Collection Time: 02/09/19  2:44 PM  Result Value Ref Range   Color, UA     Clarity, UA     Glucose, UA Negative Negative   Bilirubin, UA     Ketones, UA small    Spec Grav, UA     Blood, UA neg    pH, UA     POC,PROTEIN,UA Trace Negative, Trace, Small (1+), Moderate (2+), Large (3+), 4+   Urobilinogen, UA     Nitrite, UA neg    Leukocytes, UA Negative Negative   Appearance     Odor      Assessment & Plan:  1) Low-risk pregnancy [id number] at [redacted]w[redacted]d with an Estimated Date of Delivery: 02/02/19   2) Post dates,    Meds: No orders of the defined types were placed in this encounter.  Labs/procedures today: Reactive NST  [name] is at [redacted]w[redacted]d Estimated Date of Delivery: 02/02/19  NST being performed due to postdates  Today the NST is Reactive  Fetal Monitoring:  Baseline: 135 bpm, Variability: Good {> 6 bpm), Accelerations: Reactive and Decelerations: Absent   reactive  The accelerations are >15 bpm and more than 2 in 20 minutes  Final diagnosis:  Reactive NST  Florian Buff, MD    Plan:  Foley placed IOL tomorrow am  Reviewed: Term labor symptoms and general obstetric precautions including but not limited to vaginal bleeding, contractions, leaking of fluid and fetal movement were reviewed in detail with the patient.  All questions were answered.  home bp cuff. Rx faxed to . Check bp weekly, let us know if >140/90.   Follow-up: Return in about 6 weeks (around 03/23/2019) for post partum visit.  Orders Placed This Encounter  Procedures  . POC Urinalysis Dipstick OB   Mertie Clause   02/09/2019 3:15 PM

## 2019-02-09 NOTE — H&P (Signed)
OBSTETRIC ADMISSION HISTORY AND PHYSICAL  Marissa Friedman is a 23 y.o. female [phone] with IUP at [redacted]w[redacted]d by 11 wk U/S presenting with SOL. She is scheduled for PD IOL @ 0730. She was seen in the office today for NST and FB placement. She started having strong UC's around 1700 (~2 hrs after FB).   Reports fetal movement. Denies vaginal bleeding. Denies LOF.  [facility]  Support person in labor: [name] (spouse)  Ultrasounds . Anatomy U/S: Normal  Prenatal History/Complications: . None  OB BOX:  [facility]  LAB RESULTS  Language English Pap 09/08/18: neg  Initiated care at 19wk GC/CT Initial: -/-           36wks:  -/-  Dating by 11wk U/S    Support person [name] NT/IT:too late  AVW:[id number]:declined      MaterniT21:declined    Ottertail/HgbE neg  Flu vaccine 09/08/2018 CF declined  TDaP vaccine 12/08/18 SMA declined  Rhogam n/a Fragile X declined       Anatomy US Female 'Marissa Friedman', mild bilateral RPD, repeat @ 32wks: resolved Blood Type O/Positive/-- (03/09 1441)  Feeding Plan bottle Antibody Negative (03/09 1441)  Contraception Nexp  HBsAg Negative (03/09 1441)  Circumcision n/a RPR Non Reactive (03/09 1441)  Pediatrician [facility] Rubella  1.64 (03/09 1441)  Prenatal Classes declined HIV Non Reactive (03/09 1441)    GTT/A1C Early:      26-28wks:83/102/85  BTL Consent  GBS  neg      [ ]  PCN allergy  VBAC Consent n/a    Waterbirth [ ] Class [ ] Consent [ ] CNM visit PP Needs  IBH - h/o anxiety   Past Medical History: Past Medical History:  Diagnosis Date  . Anxiety     Past Surgical History: Past Surgical History:  Procedure Laterality Date  . NO PAST SURGERIES      Obstetrical History: OB History    Gravida  4   Para  3   Term  3   Preterm      AB      Living  3     SAB      TAB      Ectopic      Multiple  0   Live Births  3        Obstetric Comments  [facility]        Social History: Social  History   Socioeconomic History  . Marital status: Married    Spouse name: Not on file  . Number of children: 3  . Years of education: Not on file  . Highest education level: Not on file  Occupational History  . Not on file  Social Needs  . Financial resource strain: Not hard at all  . Food insecurity    Worry: Never true    Inability: Never true  . Transportation needs    Medical: No    Non-medical: Not on file  Tobacco Use  . Smoking status: Former Smoker    Packs/day: 0.00    Years: 5.00    Pack years: 0.00    Types: Cigarettes  . Smokeless tobacco: Never Used  Substance and Sexual Activity  . Alcohol use: No    Comment: occasional. None since +preg test.  . Drug use: No  . Sexual activity: Yes    Birth control/protection: None  Lifestyle  . Physical activity    Days per week: Not on file  Minutes per session: Not on file  . Stress: Not at all  Relationships  . Social Herbalist on phone: Not on file    Gets together: Not on file    Attends religious service: Not on file    Active member of club or organization: Not on file    Attends meetings of clubs or organizations: Not on file    Relationship status: Not on file  Other Topics Concern  . Not on file  Social History Narrative  . Not on file    Family History: Family History  Problem Relation Age of Onset  . Hypertension Paternal Grandmother   . Stroke Other   . Coronary artery disease Other     Allergies: Allergies  Allergen Reactions  . Penicillins     Childhood reaction Has patient had a PCN reaction causing immediate rash, facial/tongue/throat swelling, SOB or lightheadedness with hypotension: Unknown Has patient had a PCN reaction causing severe rash involving mucus membranes or skin necrosis: No Has patient had a PCN reaction that required hospitalization: No Has patient had a PCN reaction occurring within the last 10 years: No If all of the above answers are "NO", then may  proceed with Cephalosporin use.  . Wellbutrin [Bupropion]     Pt unsure of reaction.- facial swelling- childhood    Medications Prior to Admission  Medication Sig Dispense Refill Last Dose  . prenatal vitamin w/FE, FA (PRENATAL 1 + 1) 27-1 MG TABS tablet Take 1 tablet by mouth daily at 12 noon. 30 each 12 02/08/2019 at Unknown time  . pantoprazole (PROTONIX) 20 MG tablet Take 1 tablet (20 mg total) by mouth daily. (Patient not taking: Reported on 02/03/2019) 30 tablet 3      Review of Systems  All systems reviewed and negative except as stated in HPI  Blood pressure 138/72, pulse 81, temperature 98.3 F (36.8 C), temperature source Oral, resp. rate 19, weight 94.1 kg, last menstrual period 04/21/2018. General appearance: alert, cooperative and mild distress Lungs: no respiratory distress Heart: regular rate  Abdomen: soft, non-tender; gravid b Pelvic: FB sitting in vagina -- pulled out without any difficulties Extremities: Homans sign is negative, no sign of DVT Presentation: cephalic Fetal monitoring: 120 bpm / moderate variability / accels present / decels absent Uterine activity: regular every 2-4 mins  Dilation: 7 Effacement (%): 100 Exam by:: [name] RN   Prenatal labs: ABO, Rh: O/Positive/-- (03/09 1441) Antibody: Negative (05/04 0902) Rubella: 1.64 (03/09 1441) RPR: Non Reactive (05/04 0902)  HBsAg: Negative (03/09 1441)  HIV: Non Reactive (05/04 0902)  GBS:  NEGATIVE Glucola: Normal 83-102-85 Genetic screening:  DECLINED  Prenatal Transfer Tool  Maternal Diabetes: No Genetic Screening: Declined Maternal Ultrasounds/Referrals: Normal Fetal Ultrasounds or other Referrals:  None Maternal Substance Abuse:  No Significant Maternal Medications:  None Significant Maternal Lab Results: Group B Strep negative  Results for orders placed or performed in visit on 02/09/19 (from the past 24 hour(s))  POC Urinalysis Dipstick OB   Collection Time: 02/09/19  2:44 PM   Result Value Ref Range   Color, UA     Clarity, UA     Glucose, UA Negative Negative   Bilirubin, UA     Ketones, UA small    Spec Grav, UA     Blood, UA neg    pH, UA     POC,PROTEIN,UA Trace Negative, Trace, Small (1+), Moderate (2+), Large (3+), 4+   Urobilinogen, UA  Nitrite, UA neg    Leukocytes, UA Negative Negative   Appearance     Odor      Patient Active Problem List   Diagnosis Date Noted  . Asymptomatic bacteriuria during pregnancy in second trimester 09/15/2018  . Supervision of normal pregnancy 09/08/2018  . Late prenatal care in second trimester 09/08/2018  . Short interval between pregnancies affecting pregnancy in first trimester, antepartum 07/07/2018  . Rubella non-immune status, antepartum 04/26/2017    Assessment/Plan:  Marissa Friedman is a 23 y.o. [phone] at 7455w0d here for SOL in postdates pregnancy, s/p FB placement this afternoon  Labor: active -- pain control: requesting epidural  Fetal Wellbeing: EFW 7 lbs by Leopold's. Cephalic by SVE.  -- GBS (NEG) -- continuous fetal monitoring - category 1   Postpartum Planning -- bottlefeeding -- contraception inpt Neplanon -- [X]  Tdap on 12/08/2018   Raelyn Moraolitta Anjali Manzella, CNM  02/09/2019, 11:26 PM

## 2019-02-10 ENCOUNTER — Encounter (HOSPITAL_COMMUNITY): Payer: Self-pay

## 2019-02-10 ENCOUNTER — Inpatient Hospital Stay (HOSPITAL_COMMUNITY): Payer: Medicaid Other

## 2019-02-10 ENCOUNTER — Inpatient Hospital Stay (HOSPITAL_COMMUNITY): Payer: Medicaid Other | Admitting: Anesthesiology

## 2019-02-10 DIAGNOSIS — Z3A41 41 weeks gestation of pregnancy: Secondary | ICD-10-CM

## 2019-02-10 DIAGNOSIS — O480 Post-term pregnancy: Secondary | ICD-10-CM

## 2019-02-10 LAB — CBC
HCT: 35.4 % — ABNORMAL LOW (ref 36.0–46.0)
Hemoglobin: 12.2 g/dL (ref 12.0–15.0)
MCH: 30.6 pg (ref 26.0–34.0)
MCHC: 34.5 g/dL (ref 30.0–36.0)
MCV: 88.7 fL (ref 80.0–100.0)
Platelets: 133 10*3/uL — ABNORMAL LOW (ref 150–400)
RBC: 3.99 MIL/uL (ref 3.87–5.11)
RDW: 14.2 % (ref 11.5–15.5)
WBC: 10.9 10*3/uL — ABNORMAL HIGH (ref 4.0–10.5)
nRBC: 0 % (ref 0.0–0.2)

## 2019-02-10 LAB — RPR: RPR Ser Ql: NONREACTIVE

## 2019-02-10 LAB — TYPE AND SCREEN
ABO/RH(D): O POS
Antibody Screen: NEGATIVE

## 2019-02-10 LAB — ABO/RH: ABO/RH(D): O POS

## 2019-02-10 MED ORDER — PANTOPRAZOLE SODIUM 20 MG PO TBEC
20.0000 mg | DELAYED_RELEASE_TABLET | Freq: Every day | ORAL | Status: DC
  Administered 2019-02-10: 11:00:00 20 mg via ORAL
  Filled 2019-02-10: qty 1

## 2019-02-10 MED ORDER — SODIUM CHLORIDE (PF) 0.9 % IJ SOLN
INTRAMUSCULAR | Status: DC | PRN
  Administered 2019-02-10: 10 mL/h via EPIDURAL

## 2019-02-10 MED ORDER — LACTATED RINGERS IV SOLN: 500.0000 mL | Freq: Once | INTRAVENOUS | Status: DC

## 2019-02-10 MED ORDER — TETANUS-DIPHTH-ACELL PERTUSSIS 5-2.5-18.5 LF-MCG/0.5 IM SUSP: 0.5000 mL | Freq: Once | INTRAMUSCULAR | Status: DC

## 2019-02-10 MED ORDER — DIPHENHYDRAMINE HCL 25 MG PO CAPS: 25.0000 mg | ORAL_CAPSULE | Freq: Four times a day (QID) | ORAL | Status: DC | PRN

## 2019-02-10 MED ORDER — IBUPROFEN 600 MG PO TABS
600.0000 mg | ORAL_TABLET | Freq: Four times a day (QID) | ORAL | Status: DC
  Administered 2019-02-10 – 2019-02-11 (×5): 600 mg via ORAL
  Filled 2019-02-10 (×5): qty 1

## 2019-02-10 MED ORDER — LIDOCAINE HCL 1 % IJ SOLN
0.0000 mL | Freq: Once | INTRAMUSCULAR | Status: AC | PRN
  Administered 2019-02-10: 5 mL via INTRADERMAL
  Filled 2019-02-10: qty 20

## 2019-02-10 MED ORDER — ETONOGESTREL 68 MG ~~LOC~~ IMPL
68.0000 mg | DRUG_IMPLANT | Freq: Once | SUBCUTANEOUS | Status: AC
  Administered 2019-02-10: 68 mg via SUBCUTANEOUS
  Filled 2019-02-10: qty 1

## 2019-02-10 MED ORDER — WITCH HAZEL-GLYCERIN EX PADS: 1.0000 "application " | MEDICATED_PAD | CUTANEOUS | Status: DC | PRN

## 2019-02-10 MED ORDER — ERYTHROMYCIN 5 MG/GM OP OINT
TOPICAL_OINTMENT | OPHTHALMIC | Status: AC
  Filled 2019-02-10: qty 1

## 2019-02-10 MED ORDER — BENZOCAINE-MENTHOL 20-0.5 % EX AERO: 1.0000 "application " | INHALATION_SPRAY | CUTANEOUS | Status: DC | PRN

## 2019-02-10 MED ORDER — SIMETHICONE 80 MG PO CHEW: 80.0000 mg | CHEWABLE_TABLET | ORAL | Status: DC | PRN

## 2019-02-10 MED ORDER — ACETAMINOPHEN 325 MG PO TABS: 650.0000 mg | ORAL_TABLET | ORAL | Status: DC | PRN

## 2019-02-10 MED ORDER — DIBUCAINE (PERIANAL) 1 % EX OINT: 1.0000 "application " | TOPICAL_OINTMENT | CUTANEOUS | Status: DC | PRN

## 2019-02-10 MED ORDER — BUPIVACAINE HCL (PF) 0.25 % IJ SOLN
INTRAMUSCULAR | Status: DC | PRN
  Administered 2019-02-10: 1 mL via INTRATHECAL

## 2019-02-10 MED ORDER — PRENATAL MULTIVITAMIN CH
1.0000 | ORAL_TABLET | Freq: Every day | ORAL | Status: DC
  Administered 2019-02-10 – 2019-02-11 (×2): 1 via ORAL
  Filled 2019-02-10 (×2): qty 1

## 2019-02-10 MED ORDER — ONDANSETRON HCL 4 MG PO TABS: 4.0000 mg | ORAL_TABLET | ORAL | Status: DC | PRN

## 2019-02-10 MED ORDER — FENTANYL CITRATE (PF) 100 MCG/2ML IJ SOLN
INTRAMUSCULAR | Status: DC | PRN
  Administered 2019-02-10: 15 ug via INTRATHECAL

## 2019-02-10 MED ORDER — ZOLPIDEM TARTRATE 5 MG PO TABS: 5.0000 mg | ORAL_TABLET | Freq: Every evening | ORAL | Status: DC | PRN

## 2019-02-10 MED ORDER — COCONUT OIL OIL: 1.0000 "application " | TOPICAL_OIL | Status: DC | PRN

## 2019-02-10 MED ORDER — SENNOSIDES-DOCUSATE SODIUM 8.6-50 MG PO TABS: 2.0000 | ORAL_TABLET | ORAL | Status: DC

## 2019-02-10 MED ORDER — ONDANSETRON HCL 4 MG/2ML IJ SOLN: 4.0000 mg | INTRAMUSCULAR | Status: DC | PRN

## 2019-02-10 NOTE — Progress Notes (Signed)
Marissa Friedman, CNM at bedside; foley bulb removed. Pt reports it was placed earlier this afternoon in the office.  Juluis Mire, RN

## 2019-02-10 NOTE — Progress Notes (Signed)
Subjective: Marissa Friedman is a 23 y.o. [phone] at [redacted]w[redacted]d by ultrasound admitted for active labor, postdates pregnancy. S/P FB for IOL.   Objective: BP 115/80   Pulse (!) 108   Temp 98.3 F (36.8 C) (Oral)   Resp 16   Wt 94.1 kg   LMP 04/21/2018 (Within Weeks)   SpO2 98%   BMI 35.62 kg/m  No intake/output data recorded. No intake/output data recorded.  FHT:  FHR: 130 bpm, variability: moderate,  accelerations:  Present,  decelerations:  Present just after AROM, but rapid return to baseline UC:   regular, every 1-3 minutes SVE:   Dilation: 8 Effacement (%): 100 Exam by: Sunday Corn, CNM AROM with large amount of clear fluid in return  Labs: Lab Results  Component Value Date   WBC 10.9 (H) 02/09/2019   HGB 12.2 02/09/2019   HCT 35.4 (L) 02/09/2019   MCV 88.7 02/09/2019   PLT 133 (L) 02/09/2019    Assessment / Plan: Spontaneous labor, progressing normally  Labor: Progressing normally Preeclampsia:  n/a Fetal Wellbeing:  Category I Pain Control:  Epidural I/D:  n/a Anticipated MOD:  NSVD  Laury Deep, MSN, CNM 02/10/2019, 2:03 AM

## 2019-02-10 NOTE — Anesthesia Procedure Notes (Signed)
Epidural Patient location during procedure: OB Start time: 02/10/2019 12:15 AM End time: 02/10/2019 12:18 AM  Staffing Anesthesiologist: Brennan Bailey, MD Performed: anesthesiologist   Preanesthetic Checklist Completed: patient identified, pre-op evaluation, timeout performed, IV checked, risks and benefits discussed and monitors and equipment checked  Epidural Patient position: sitting Prep: site prepped and draped and DuraPrep Patient monitoring: heart rate, continuous pulse ox and blood pressure Approach: midline Location: L3-L4 Injection technique: LOR air  Needle:  Needle type: Tuohy  Needle gauge: 17 G Needle length: 9 cm Needle insertion depth: 6 cm Catheter type: closed end flexible Catheter size: 19 Gauge Catheter at skin depth: 11 cm  Assessment Events: blood not aspirated, injection not painful, no injection resistance, negative IV test and no paresthesia  Additional Notes Patient identified. Risks, benefits, and alternatives discussed with patient including but not limited to bleeding, infection, nerve damage, paralysis, failed block, incomplete pain control, headache, blood pressure changes, nausea, vomiting, reactions to medication, itching, and postpartum back pain. Confirmed with bedside nurse the patient's most recent platelet count. Confirmed with patient that they are not currently taking any anticoagulation, have any bleeding history, or any family history of bleeding disorders. Patient expressed understanding and wished to proceed. All questions were answered. Sterile technique was used throughout the entire procedure. Please see nursing notes for vital signs.   Crisp LOR with Tuohy needle after 2 attempts, somewhat difficult due to patient positioning with precipitous labor. Whitacre 25g 147mm spinal needle introduced through Tuohy with clear CSF return prior to injection of intrathecal medication. Spinal needle withdrawn and epidural catheter threaded  easily. Negative aspiration of catheter for heme or CSF prior to starting epidural infusion. Warning signs of high block given to the patient including shortness of breath, tingling/numbness in hands, complete motor block, or any concerning symptoms with instructions to call for help. Patient was given instructions on fall risk and not to get out of bed. All questions and concerns addressed with instructions to call with any issues or inadequate analgesia.  Patient comfortable with contractions prior to my leaving room. Reason for block:procedure for pain

## 2019-02-10 NOTE — Progress Notes (Signed)
Marissa Friedman was referred for history of depression/anxiety. * Referral screened out by Clinical Social Worker because none of the following criteria appear to apply: ~ History of anxiety/depression during this pregnancy, or of post-partum depression following prior delivery. ~ Diagnosis of anxiety and/or depression within last 3 years. Per further chart review of Marissa Friedman, Marissa Friedman diagnosed with anxiety in 2013. OR * Marissa Friedman's symptoms currently being treated with medication and/or therapy.    Please contact the Clinical Social Worker if needs arise, by [facility] request, or if Marissa Friedman scores greater than 9/yes to question 10 on Edinburgh Postpartum Depression Screen.     Virgie Dad Verlena Marlette, MSW, LCSW [facility] [phone]

## 2019-02-10 NOTE — Progress Notes (Signed)
Post-Placental Nexplanon Insertion Procedure Note  Patient was identified. Informed consent was signed, signed copy in chart. A time-out was performed.    The insertion site was identified 8-10 cm (3-4 inches) from the medial epicondyle of the humerus and 3-5 cm (1.25-2 inches) posterior to (below) the sulcus (groove) between the biceps and triceps muscles of the patient's left arm and marked. The site was prepped and draped in the usual sterile fashion. Pt was prepped with alcohol swab and then injected with 5 cc of 1% lidocaine. The site was prepped with betadine. Nexplanon removed form packaging,  Device confirmed in needle, then inserted full length of needle and withdrawn per handbook instructions. Provider and patient verified presence of the implant in the woman's arm by palpation. Pt insertion site was covered with steristrips/adhesive bandage and pressure bandage. There was minimal blood loss. Patient tolerated procedure well.  Patient was given post procedure instructions and Nexplanon user card with expiration date. Condoms were recommended for STI prevention. Patient was asked to keep the pressure dressing on for 24 hours to minimize bruising and keep the adhesive bandage on for 3-5 days. The patient verbalized understanding of the plan of care and agrees.   Lot # [id number] Expiration Date 05/02/2021

## 2019-02-10 NOTE — Anesthesia Preprocedure Evaluation (Signed)
Anesthesia Evaluation  Patient identified by MRN, date of birth, ID band Patient awake    Reviewed: Allergy & Precautions, Patient's Chart, lab work & pertinent test results  History of Anesthesia Complications Negative for: history of anesthetic complications  Airway Mallampati: II  TM Distance: >3 FB Neck ROM: Full    Dental no notable dental hx.    Pulmonary former smoker,    Pulmonary exam normal        Cardiovascular negative cardio ROS Normal cardiovascular exam     Neuro/Psych Anxiety negative neurological ROS     GI/Hepatic negative GI ROS, Neg liver ROS,   Endo/Other  negative endocrine ROS  Renal/GU negative Renal ROS     Musculoskeletal negative musculoskeletal ROS (+)   Abdominal   Peds  Hematology negative hematology ROS (+)   Anesthesia Other Findings Day of surgery medications reviewed with the patient.  Reproductive/Obstetrics (+) Pregnancy                             Anesthesia Physical Anesthesia Plan  ASA: II  Anesthesia Plan: Combined Spinal and Epidural   Post-op Pain Management:    Induction:   PONV Risk Score and Plan: Treatment may vary due to age or medical condition  Airway Management Planned: Natural Airway  Additional Equipment:   Intra-op Plan:   Post-operative Plan:   Informed Consent: I have reviewed the patients History and Physical, chart, labs and discussed the procedure including the risks, benefits and alternatives for the proposed anesthesia with the patient or authorized representative who has indicated his/her understanding and acceptance.       Plan Discussed with:   Anesthesia Plan Comments:         Anesthesia Quick Evaluation

## 2019-02-10 NOTE — Lactation Note (Signed)
This note was copied from a baby's chart. Lactation Consultation Note  Patient Name: Girl Shamaria Kavan Today's Date: 02/10/2019 Reason for consult: Initial assessment   LC Initial Visit:  Mother's feeding choice on admission was breast/bottle.  However, when I arrived for my first visit mother informed me that she was going to formula feed only.  Lactation services will not be needed.               Consult Status Consult Status: Complete    Vergie Zahm R Keymora Grillot 02/10/2019, 2:01 PM

## 2019-02-10 NOTE — Anesthesia Postprocedure Evaluation (Signed)
Anesthesia Post Note  Patient: Marissa Friedman  Procedure(s) Performed: AN AD HOC LABOR EPIDURAL     Patient location during evaluation: Mother Baby Anesthesia Type: Combined Spinal/Epidural Level of consciousness: awake and alert and oriented Pain management: satisfactory to patient Vital Signs Assessment: post-procedure vital signs reviewed and stable Respiratory status: respiratory function stable Cardiovascular status: stable Postop Assessment: no headache, no backache, epidural receding, patient able to bend at knees, no signs of nausea or vomiting and adequate PO intake Anesthetic complications: no    Last Vitals:  Vitals:   02/10/19 0545 02/10/19 0700  BP: (!) 119/58 117/72  Pulse: 67 72  Resp:    Temp: 37.1 C 37 C  SpO2:      Last Pain:  Vitals:   02/10/19 0700  TempSrc: Oral  PainSc:    Pain Goal: Patients Stated Pain Goal: 0 (02/09/19 2304)                 Marissa Friedman

## 2019-02-10 NOTE — Discharge Summary (Signed)
Postpartum Discharge Summary     Patient Name: Marissa PatersonBrittany N Arntz DOB: 05/22/1996 MRN: [id number]  Date of admission: 02/09/2019 Delivering Provider: Raelyn MoraAWSON, ROLITTA   Date of discharge: 02/10/2019  Admitting diagnosis: 41 WKS, CTX Intrauterine pregnancy: 8358w1d     Secondary diagnosis:  Active Problems:   Post-dates pregnancy  Additional problems:      Discharge diagnosis: Term Pregnancy Delivered                                                                                                Post partum procedures:Nexplanon insertion  Augmentation: Pitocin  Complications: None  Hospital course:  Onset of Labor With Vaginal Delivery     23 y.o. yo [phone] at 8458w1d was admitted in Active Labor on 02/09/2019. Patient had an uncomplicated labor course as follows:  Membrane Rupture Time/Date: 1:33 AM ,02/10/2019   Intrapartum Procedures: Episiotomy: None [1]                                         Lacerations:  None [1]  Patient had a delivery of a Viable infant. 02/10/2019  Information for the patient's newborn:  Marissa Friedman, Girl GrenadaBrittany [[id number]][[id number]]  Delivery Method: Vaginal, Spontaneous(Filed from Delivery Summary)     Pateint had an uncomplicated postpartum course.  She is ambulating, tolerating a regular diet, passing flatus, and urinating well. Patient is discharged home in stable condition on 02/11/19.   Magnesium Sulfate received: No BMZ received: No  Physical exam  Vitals:   02/10/19 1500 02/10/19 1840 02/10/19 2300 02/11/19 0602  BP: (!) 109/52 117/71 113/67 104/68  Pulse: 65 66 66 (!) 54  Resp: 16 18 18 18   Temp: 98.6 F (37 C) 97.7 F (36.5 C) 98.1 F (36.7 C) 98 F (36.7 C)  TempSrc: Oral Oral Tympanic Oral  SpO2: 100% 99% 100% 100%  Weight:       General: alert, cooperative and no distress Lochia: appropriate Uterine Fundus: firm Incision: N/A DVT Evaluation: No significant calf/ankle edema. Labs: Lab Results  Component Value Date   WBC 10.9 (H)  02/09/2019   HGB 12.2 02/09/2019   HCT 35.4 (L) 02/09/2019   MCV 88.7 02/09/2019   PLT 133 (L) 02/09/2019   CMP Latest Ref Rng & Units 07/12/2014  Glucose 70 - 99 mg/dL 130(Q117(H)  BUN 6 - 23 mg/dL 11  Creatinine 6.570.50 - 8.461.10 mg/dL 9.620.67  Sodium 952135 - 841145 mmol/L 139  Potassium 3.5 - 5.1 mmol/L 4.0  Chloride 96 - 112 mEq/L 107  CO2 19 - 32 mmol/L 24  Calcium 8.4 - 10.5 mg/dL 9.1  Total Protein 6.0 - 8.3 g/dL 7.6  Total Bilirubin 0.3 - 1.2 mg/dL 0.5  Alkaline Phos 39 - 117 U/L 78  AST 0 - 37 U/L 21  ALT 0 - 35 U/L 18    Discharge instruction: per After Visit Summary and "Baby and Me Booklet".  After visit meds:  Allergies as of 02/11/2019      Reactions   Penicillins Swelling  Childhood reaction Has patient had a PCN reaction causing immediate rash, facial/tongue/throat swelling, SOB or lightheadedness with hypotension: Unknown Has patient had a PCN reaction causing severe rash involving mucus membranes or skin necrosis: No Has patient had a PCN reaction that required hospitalization: No Has patient had a PCN reaction occurring within the last 10 years: No If all of the above answers are "NO", then may proceed with Cephalosporin use.   Wellbutrin [bupropion] Swelling   Pt unsure of reaction.- facial swelling- childhood      Medication List    STOP taking these medications   pantoprazole 20 MG tablet Commonly known as: Protonix     TAKE these medications   acetaminophen 325 MG tablet Commonly known as: Tylenol Take 2 tablets (650 mg total) by mouth every 4 (four) hours as needed (for pain scale < 4).   ibuprofen 600 MG tablet Commonly known as: ADVIL Take 1 tablet (600 mg total) by mouth every 6 (six) hours.   prenatal vitamin w/FE, FA 27-1 MG Tabs tablet Take 1 tablet by mouth daily at 12 noon.       Diet: routine diet  Activity: Advance as tolerated. Pelvic rest for 6 weeks.   Outpatient follow up:4 weeks Follow up Appt: Future Appointments  Date Time  Provider Rew  03/23/2019  3:30 PM Roma Schanz, CNM [facility]   Follow up Visit:   Please schedule this patient for Postpartum visit in: 6 weeks with the following provider: Any provider For C/S patients schedule nurse incision check in weeks 2 weeks: no Low risk pregnancy complicated by: none Delivery mode:  SVD Anticipated Birth Control:  Nexplanon PP Procedures needed: none  Schedule Integrated Avalon visit: yes - needs IBH for h/o anxiety   Newborn Data: Live born female "Marissa Friedman" Birth Weight: 3600g/ 7lb 15oz APGAR: 8, 9  Newborn Delivery   Birth date/time: 02/10/2019 03:50:00 Delivery type: Vaginal, Spontaneous      Baby Feeding: Bottle Disposition:home with mother   02/11/2019 Naaman Plummer Autry-Lott, DO  OB FELLOW ATTESTATION  I have seen and examined this patient and agree with above documentation in the resident's note except as noted below.  No changes, agree with above  Augustin Coupe, MD/[facility] OB Fellow  02/11/2019, 5:23 PM

## 2019-02-11 LAB — BIRTH TISSUE RECOVERY COLLECTION (PLACENTA DONATION)

## 2019-02-11 MED ORDER — IBUPROFEN 600 MG PO TABS: 600.0000 mg | ORAL_TABLET | Freq: Four times a day (QID) | ORAL | 0 refills | Status: DC

## 2019-02-11 MED ORDER — ACETAMINOPHEN 325 MG PO TABS: 650.0000 mg | ORAL_TABLET | ORAL | 0 refills | Status: DC | PRN

## 2019-03-23 ENCOUNTER — Telehealth: Payer: Medicaid Other | Admitting: Women's Health

## 2019-03-31 ENCOUNTER — Other Ambulatory Visit: Payer: Self-pay | Admitting: Women's Health

## 2019-03-31 MED ORDER — MEGESTROL ACETATE 40 MG PO TABS: ORAL_TABLET | ORAL | 1 refills | Status: DC

## 2019-04-15 ENCOUNTER — Other Ambulatory Visit: Payer: Self-pay | Admitting: Women's Health

## 2019-04-15 MED ORDER — CLOTRIMAZOLE 1 % EX OINT: TOPICAL_OINTMENT | CUTANEOUS | 0 refills | Status: DC

## 2019-04-16 ENCOUNTER — Other Ambulatory Visit: Payer: Self-pay | Admitting: Women's Health

## 2019-04-16 MED ORDER — CLOTRIMAZOLE 1 % EX CREA: 1.0000 "application " | TOPICAL_CREAM | Freq: Two times a day (BID) | CUTANEOUS | 0 refills | Status: DC

## 2019-05-04 ENCOUNTER — Telehealth: Payer: Medicaid Other | Admitting: Women's Health

## 2019-07-09 ENCOUNTER — Ambulatory Visit
Admission: EM | Admit: 2019-07-09 | Discharge: 2019-07-09 | Disposition: A | Discharge status: Home / Self Care | Payer: Medicaid Other | Attending: Emergency Medicine | Admitting: Emergency Medicine

## 2019-07-09 ENCOUNTER — Other Ambulatory Visit: Payer: Self-pay

## 2019-07-09 DIAGNOSIS — W57XXXA Bitten or stung by nonvenomous insect and other nonvenomous arthropods, initial encounter: Secondary | ICD-10-CM

## 2019-07-09 DIAGNOSIS — S50861A Insect bite (nonvenomous) of right forearm, initial encounter: Secondary | ICD-10-CM

## 2019-07-09 DIAGNOSIS — B354 Tinea corporis: Secondary | ICD-10-CM | POA: Diagnosis not present

## 2019-07-09 MED ORDER — CETIRIZINE HCL 10 MG PO CHEW: 10.0000 mg | CHEWABLE_TABLET | Freq: Every day | ORAL | 0 refills | Status: DC

## 2019-07-09 MED ORDER — CLOTRIMAZOLE 1 % EX CREA: TOPICAL_CREAM | CUTANEOUS | 0 refills | Status: DC

## 2019-07-09 NOTE — Discharge Instructions (Addendum)
Prescribed cetirizine and calamine lotion use as needed for itchiness Avoid excessive scratching, which may put you at risk for developing a secondary bacterial infection Take OTC anti-inflammatories, like ibuprofen or motrin, as needed for pain and swelling Follow up with PCP if symptoms persist Return or go to ER if you have any new or worsening symptoms  

## 2019-07-09 NOTE — ED Triage Notes (Signed)
Pt presents to UC w/ c/o insect bite to right inner forearm since one day ago. Bite is red and raised and redness on inner forearm.  Pt also has some rounded dry skin marks on her back x1 month. Pt has tried creams w/o relief. Pt believes it may be ringworm.

## 2019-07-09 NOTE — ED Provider Notes (Signed)
[facility]    CSN: [id number] Arrival date & time: 07/09/19  1819      History   Chief Complaint Chief Complaint  Patient presents with  . Insect Bite    HPI Marissa Friedman is a 24 y.o. female.   [place] 24 years old female presented to the urgent care with a complaint of attached insect bite for one day.  Denies a precipitating event, noticed while changing clothes after church.  Localizes the bite to her right inner forearm .  The bite is itchy and patient is unaware what insect bite her.  Has not tried medications.  Denies previous hx of tick or insect bite.  Denies fever, chills, nausea, vomiting, headache, dizziness, weakness, fatigue, or abdominal pain.  Patient also complaining of ringworm on her back and would like a medication to be prescribed.  The history is provided by the patient. No language interpreter was used.    Past Medical History:  Diagnosis Date  . Anxiety     Patient Active Problem List   Diagnosis Date Noted  . Post-dates pregnancy 02/09/2019  . Asymptomatic bacteriuria during pregnancy in second trimester 09/15/2018  . Supervision of normal pregnancy 09/08/2018  . Late prenatal care in second trimester 09/08/2018  . Short interval between pregnancies affecting pregnancy in first trimester, antepartum 07/07/2018  . Rubella non-immune status, antepartum 04/26/2017    Past Surgical History:  Procedure Laterality Date  . NO PAST SURGERIES      OB History    Gravida  4   Para  4   Term  4   Preterm      AB      Living  4     SAB      TAB      Ectopic      Multiple  0   Live Births  4        Obstetric Comments  [facility] Medications    Prior to Admission medications   Medication Sig Start Date End Date Taking? Authorizing Provider  acetaminophen (TYLENOL) 325 MG tablet Take 2 tablets (650 mg total) by mouth every 4 (four) hours as needed (for pain scale < 4).  02/11/19   Autry-Lott, Naaman Plummer, DO  cetirizine (ZYRTEC) 10 MG chewable tablet Chew 1 tablet (10 mg total) by mouth daily. 07/09/19 08/08/19  Carrianne Hyun, Darrelyn Hillock, FNP  clotrimazole (LOTRIMIN) 1 % cream Apply 1 application topically 2 (two) times daily. 04/16/19   Roma Schanz, CNM  clotrimazole (LOTRIMIN) 1 % cream Apply to affected area 2 times daily 07/09/19   Densil Ottey, Darrelyn Hillock, FNP  ibuprofen (ADVIL) 600 MG tablet Take 1 tablet (600 mg total) by mouth every 6 (six) hours. 02/11/19   Autry-Lott, Naaman Plummer, DO  megestrol (MEGACE) 40 MG tablet 3x5d, 2x5d, then 1 daily to help control vaginal bleeding. Stop taking when bleeding stops. 03/31/19   Roma Schanz, CNM  prenatal vitamin w/FE, FA (PRENATAL 1 + 1) 27-1 MG TABS tablet Take 1 tablet by mouth daily at 12 noon. 07/07/18   Estill Dooms, NP    Family History Family History  Problem Relation Age of Onset  . Hypertension Paternal Grandmother   . Stroke Other   . Coronary artery disease Other     Social History Social History   Tobacco Use  . Smoking status: Former Smoker    Packs/day: 0.00    Years: 5.00  Pack years: 0.00    Types: Cigarettes  . Smokeless tobacco: Never Used  Substance Use Topics  . Alcohol use: No    Comment: occasional. None since +preg test.  . Drug use: No     Allergies   Penicillins and Wellbutrin [bupropion]   Review of Systems Review of Systems  Constitutional: Negative.   Respiratory: Negative.   Cardiovascular: Negative.   Skin: Positive for rash.  Neurological: Negative.      Physical Exam Triage Vital Signs ED Triage Vitals  Enc Vitals Group     BP      Pulse      Resp      Temp      Temp src      SpO2      Weight      Height      Head Circumference      Peak Flow      Pain Score      Pain Loc      Pain Edu?      Excl. in GC?    No data found.  Updated Vital Signs BP 110/74 (BP Location: Right Arm)   Pulse 74   Temp 98.2 F (36.8 C) (Oral)   Resp 16   SpO2 96%    Visual Acuity Right Eye Distance:   Left Eye Distance:   Bilateral Distance:    Right Eye Near:   Left Eye Near:    Bilateral Near:     Physical Exam Constitutional:      General: She is not in acute distress.    Appearance: Normal appearance. She is obese. She is not ill-appearing or toxic-appearing.  Cardiovascular:     Rate and Rhythm: Normal rate and regular rhythm.     Pulses: Normal pulses.     Heart sounds: Normal heart sounds. No murmur.  Pulmonary:     Effort: Pulmonary effort is normal. No respiratory distress.     Breath sounds: Normal breath sounds. No wheezing.  Chest:     Chest wall: No tenderness.  Skin:    Findings: Erythema and rash present.  Neurological:     Mental Status: She is alert.   07/09/2019     UC Treatments / Results  Labs (all labs ordered are listed, but only abnormal results are displayed) Labs Reviewed - No data to display  EKG   Radiology No results found.  Procedures Procedures (including critical care time)  Medications Ordered in UC Medications - No data to display  Initial Impression / Assessment and Plan / UC Course  I have reviewed the triage vital signs and the nursing notes.  Pertinent labs & imaging results that were available during my care of the patient were reviewed by me and considered in my medical decision making (see chart for details).   Patient stable for discharge.  Advised patient to take medication as prescribed and to return for worsening of symptoms.  Patient verbalized understanding plan of care.  Final Clinical Impressions(s) / UC Diagnoses   Final diagnoses:  Insect bite of right forearm, initial encounter  Ringworm of body     Discharge Instructions     Prescribed cetirizine and calamine lotion use as needed for itchiness Avoid excessive scratching, which may put you at risk for developing a secondary bacterial infection Take OTC anti-inflammatories, like ibuprofen or motrin, as  needed for pain and swelling Follow up with PCP if symptoms persist Return or go to ER if you have any  new or worsening symptoms    ED Prescriptions    Medication Sig Dispense Auth. Provider   cetirizine (ZYRTEC) 10 MG chewable tablet Chew 1 tablet (10 mg total) by mouth daily. 30 tablet Kelleen Stolze, Zachery Dakins, FNP   clotrimazole (LOTRIMIN) 1 % cream Apply to affected area 2 times daily 15 g Anjolina Byrer, Zachery Dakins, FNP     PDMP not reviewed this encounter.   Durward Parcel, FNP 07/09/19 1850

## 2019-09-13 ENCOUNTER — Other Ambulatory Visit: Payer: Self-pay

## 2019-09-13 ENCOUNTER — Encounter: Payer: Self-pay | Admitting: *Deleted

## 2019-09-13 ENCOUNTER — Ambulatory Visit
Admission: EM | Admit: 2019-09-13 | Discharge: 2019-09-13 | Disposition: A | Discharge status: Home / Self Care | Payer: Medicaid Other | Attending: Emergency Medicine | Admitting: Emergency Medicine

## 2019-09-13 DIAGNOSIS — R109 Unspecified abdominal pain: Secondary | ICD-10-CM | POA: Diagnosis present

## 2019-09-13 LAB — POCT URINE PREGNANCY: Preg Test, Ur: NEGATIVE

## 2019-09-13 LAB — POCT URINALYSIS DIP (MANUAL ENTRY)
Bilirubin, UA: NEGATIVE
Blood, UA: NEGATIVE
Glucose, UA: NEGATIVE mg/dL
Ketones, POC UA: NEGATIVE mg/dL
Leukocytes, UA: NEGATIVE
Nitrite, UA: NEGATIVE
Protein Ur, POC: NEGATIVE mg/dL
Spec Grav, UA: >=1.03 — AB (ref 1.010–1.025)
Urobilinogen, UA: 0.2 U/dL
pH, UA: 6 (ref 5.0–8.0)

## 2019-09-13 NOTE — ED Provider Notes (Signed)
[facility]   CC: Burning with urination  SUBJECTIVE:  Marissa Friedman is a 24 y.o. female who complains of urinary frequency, urgency and dysuria for the past 1 day.  Patient denies a precipitating event, recent sexual encounter, excessive caffeine intake.  Localizes the pain to the lower flank.  Pain is  constant and describes it as sharp.  Has tried OTC medications without relief.  Symptoms are made worse with urination.  Admits to similar symptoms in the past.  Denies fever, chills, nausea, vomiting, abdominal pain, abnormal vaginal discharge or bleeding, hematuria.    LMP: No LMP recorded. Patient has had an implant.  ROS: As in HPI.  All other pertinent ROS negative.     Past Medical History:  Diagnosis Date  . Anxiety    Past Surgical History:  Procedure Laterality Date  . NO PAST SURGERIES     Allergies  Allergen Reactions  . Penicillins Swelling    Childhood reaction Has patient had a PCN reaction causing immediate rash, facial/tongue/throat swelling, SOB or lightheadedness with hypotension: Unknown Has patient had a PCN reaction causing severe rash involving mucus membranes or skin necrosis: No Has patient had a PCN reaction that required hospitalization: No Has patient had a PCN reaction occurring within the last 10 years: No If all of the above answers are "NO", then may proceed with Cephalosporin use.  . Wellbutrin [Bupropion] Swelling    Pt unsure of reaction.- facial swelling- childhood   No current facility-administered medications on file prior to encounter.   Current Outpatient Medications on File Prior to Encounter  Medication Sig Dispense Refill  . acetaminophen (TYLENOL) 325 MG tablet Take 2 tablets (650 mg total) by mouth every 4 (four) hours as needed (for pain scale < 4). 30 tablet 0  . cetirizine (ZYRTEC) 10 MG chewable tablet Chew 1 tablet (10 mg total) by mouth daily. 30 tablet 0  . clotrimazole (LOTRIMIN) 1 % cream Apply 1 application  topically 2 (two) times daily. 30 g 0  . clotrimazole (LOTRIMIN) 1 % cream Apply to affected area 2 times daily 15 g 0  . ibuprofen (ADVIL) 600 MG tablet Take 1 tablet (600 mg total) by mouth every 6 (six) hours. 30 tablet 0  . megestrol (MEGACE) 40 MG tablet 3x5d, 2x5d, then 1 daily to help control vaginal bleeding. Stop taking when bleeding stops. 45 tablet 1  . prenatal vitamin w/FE, FA (PRENATAL 1 + 1) 27-1 MG TABS tablet Take 1 tablet by mouth daily at 12 noon. 30 each 12   Social History   Socioeconomic History  . Marital status: Married    Spouse name: Not on file  . Number of children: 3  . Years of education: Not on file  . Highest education level: Not on file  Occupational History  . Not on file  Tobacco Use  . Smoking status: Former Smoker    Packs/day: 0.00    Years: 5.00    Pack years: 0.00    Types: Cigarettes  . Smokeless tobacco: Never Used  Substance and Sexual Activity  . Alcohol use: Yes    Comment: occasionally  . Drug use: No  . Sexual activity: Yes    Birth control/protection: Implant  Other Topics Concern  . Not on file  Social History Narrative  . Not on file   Social Determinants of Health   Financial Resource Strain: Low Risk   . Difficulty of Paying Living Expenses: Not hard at all  Food Insecurity:  No Food Insecurity  . Worried About Charity fundraiser in the Last Year: Never true  . Ran Out of Food in the Last Year: Never true  Transportation Needs: Unknown  . Lack of Transportation (Medical): No  . Lack of Transportation (Non-Medical): Not on file  Physical Activity:   . Days of Exercise per Week:   . Minutes of Exercise per Session:   Stress: No Stress Concern Present  . Feeling of Stress : Not at all  Social Connections:   . Frequency of Communication with Friends and Family:   . Frequency of Social Gatherings with Friends and Family:   . Attends Religious Services:   . Active Member of Clubs or Organizations:   . Attends Theatre manager Meetings:   Marland Kitchen Marital Status:   Intimate Partner Violence: Not At Risk  . Fear of Current or Ex-Partner: No  . Emotionally Abused: No  . Physically Abused: No  . Sexually Abused: No   Family History  Problem Relation Age of Onset  . Healthy Mother   . Healthy Father   . Hypertension Paternal Grandmother   . Stroke Other   . Coronary artery disease Other     OBJECTIVE:  Vitals:   09/13/19 0953  BP: 119/81  Pulse: 77  Resp: 16  Temp: 98.6 F (37 C)  TempSrc: Oral  SpO2: 97%   General appearance: AOx3 in no acute distress HEENT: NCAT.  Oropharynx clear.  Lungs: clear to auscultation bilaterally without adventitious breath sounds Heart: regular rate and rhythm.  Radial pulses 2+ symmetrical bilaterally Abdomen: soft; non-distended; no tenderness; bowel sounds present; no guarding or rebound tenderness Back: no CVA tenderness Extremities: no edema; symmetrical with no gross deformities Skin: warm and dry Neurologic: Ambulates from chair to exam table without difficulty Psychological: alert and cooperative; normal mood and affect  Labs Reviewed  POCT URINALYSIS DIP (MANUAL ENTRY) - Abnormal; Notable for the following components:      Result Value   Spec Grav, UA >=1.030 (*)    All other components within normal limits  URINE CULTURE  POCT URINE PREGNANCY    ASSESSMENT & PLAN:  1. Flank pain    Patient stable at discharge. POCT urine analysis and pregnancy test were negative Patient was advised to increase fluid intake To return for worsening of symptoms  No orders of the defined types were placed in this encounter.  Discharge instruction Urine culture sent.  We will call you with the results.   Push fluids and get plenty of rest.   Follow up with PCP if symptoms persists Return here or go to ER if you have any new or worsening symptoms such as fever, worsening abdominal pain, nausea/vomiting, flank pain, etc...  Outlined signs and symptoms  indicating need for more acute intervention. Patient verbalized understanding. After Visit Summary given.     Emerson Monte, FNP 09/13/19 1013

## 2019-09-13 NOTE — ED Triage Notes (Addendum)
C/O low back, flank and some lateral abd pain since yesterday with polyuria and urinary urgency.  Denies fevers, dysuria, or hematuria.

## 2019-09-13 NOTE — Discharge Instructions (Addendum)
POCT urine analysis was negative POCT pregnancy test was negative Urine culture sent.  We will call you with the results.   Push fluids and get plenty of rest.  Follow up with PCP if symptoms persists Return here or go to ER if you have any new or worsening symptoms such as fever, worsening abdominal pain, nausea/vomiting, flank pain, etc..Marland Kitchen

## 2019-09-14 LAB — URINE CULTURE: Culture: NO GROWTH

## 2019-12-18 ENCOUNTER — Encounter: Payer: Self-pay | Admitting: Emergency Medicine

## 2019-12-18 ENCOUNTER — Ambulatory Visit
Admission: EM | Admit: 2019-12-18 | Discharge: 2019-12-18 | Disposition: A | Discharge status: Home / Self Care | Payer: Medicaid Other | Attending: Emergency Medicine | Admitting: Emergency Medicine

## 2019-12-18 DIAGNOSIS — L02214 Cutaneous abscess of groin: Secondary | ICD-10-CM | POA: Diagnosis not present

## 2019-12-18 MED ORDER — DOXYCYCLINE HYCLATE 100 MG PO CAPS: 100.0000 mg | ORAL_CAPSULE | Freq: Two times a day (BID) | ORAL | 0 refills | Status: DC

## 2019-12-18 NOTE — ED Provider Notes (Signed)
[facility]   [id number] 12/18/19 Arrival Time: 1419   Chief Complaint  Patient presents with  . Abscess     SUBJECTIVE:  Marissa Friedman is a 24 y.o. female presented to the urgent care with a complaint of abscess on left groin for the past 3 days.  Reported drainage of White color and redness.  Denies any precipitating event.  Has  not used any medication.  Symptoms are made worse with range of motion and sitting.  Denies chills, fever, nausea, vomiting, diarrhea, chest pain, chest tightness, confusion.   ROS: As per HPI.  All other pertinent ROS negative.     Past Medical History:  Diagnosis Date  . Anxiety    Past Surgical History:  Procedure Laterality Date  . NO PAST SURGERIES     Allergies  Allergen Reactions  . Penicillins Swelling    Childhood reaction Has patient had a PCN reaction causing immediate rash, facial/tongue/throat swelling, SOB or lightheadedness with hypotension: Unknown Has patient had a PCN reaction causing severe rash involving mucus membranes or skin necrosis: No Has patient had a PCN reaction that required hospitalization: No Has patient had a PCN reaction occurring within the last 10 years: No If all of the above answers are "NO", then may proceed with Cephalosporin use.  . Wellbutrin [Bupropion] Swelling    Pt unsure of reaction.- facial swelling- childhood   No current facility-administered medications on file prior to encounter.   Current Outpatient Medications on File Prior to Encounter  Medication Sig Dispense Refill  . acetaminophen (TYLENOL) 325 MG tablet Take 2 tablets (650 mg total) by mouth every 4 (four) hours as needed (for pain scale < 4). 30 tablet 0  . cetirizine (ZYRTEC) 10 MG chewable tablet Chew 1 tablet (10 mg total) by mouth daily. 30 tablet 0  . clotrimazole (LOTRIMIN) 1 % cream Apply 1 application topically 2 (two) times daily. 30 g 0  . clotrimazole (LOTRIMIN) 1 % cream Apply to affected area 2 times daily  15 g 0  . ibuprofen (ADVIL) 600 MG tablet Take 1 tablet (600 mg total) by mouth every 6 (six) hours. 30 tablet 0  . megestrol (MEGACE) 40 MG tablet 3x5d, 2x5d, then 1 daily to help control vaginal bleeding. Stop taking when bleeding stops. 45 tablet 1  . prenatal vitamin w/FE, FA (PRENATAL 1 + 1) 27-1 MG TABS tablet Take 1 tablet by mouth daily at 12 noon. 30 each 12   Social History   Socioeconomic History  . Marital status: Married    Spouse name: Not on file  . Number of children: 3  . Years of education: Not on file  . Highest education level: Not on file  Occupational History  . Not on file  Tobacco Use  . Smoking status: Former Smoker    Packs/day: 0.00    Years: 5.00    Pack years: 0.00    Types: Cigarettes  . Smokeless tobacco: Never Used  Vaping Use  . Vaping Use: Never used  Substance and Sexual Activity  . Alcohol use: Yes    Comment: occasionally  . Drug use: No  . Sexual activity: Yes    Birth control/protection: Implant  Other Topics Concern  . Not on file  Social History Narrative  . Not on file   Social Determinants of Health   Financial Resource Strain: Low Risk   . Difficulty of Paying Living Expenses: Not hard at all  Food Insecurity: No Food Insecurity  .  Worried About Charity fundraiser in the Last Year: Never true  . Ran Out of Food in the Last Year: Never true  Transportation Needs: Unknown  . Lack of Transportation (Medical): No  . Lack of Transportation (Non-Medical): Not on file  Physical Activity:   . Days of Exercise per Week:   . Minutes of Exercise per Session:   Stress: No Stress Concern Present  . Feeling of Stress : Not at all  Social Connections:   . Frequency of Communication with Friends and Family:   . Frequency of Social Gatherings with Friends and Family:   . Attends Religious Services:   . Active Member of Clubs or Organizations:   . Attends Archivist Meetings:   Marland Kitchen Marital Status:   Intimate Partner Violence:  Not At Risk  . Fear of Current or Ex-Partner: No  . Emotionally Abused: No  . Physically Abused: No  . Sexually Abused: No   Family History  Problem Relation Age of Onset  . Healthy Mother   . Healthy Father   . Hypertension Paternal Grandmother   . Stroke Other   . Coronary artery disease Other     OBJECTIVE:  Vitals:   12/18/19 1432 12/18/19 1434  BP:  106/68  Pulse:  90  Resp:  19  Temp:  98.5 F (36.9 C)  TempSrc:  Oral  SpO2:  96%  Weight: 215 lb (97.5 kg)   Height: 5\' 4"  (1.626 m)      Physical Exam Vitals and nursing note reviewed. Exam conducted with a chaperone present.  Constitutional:      General: She is not in acute distress.    Appearance: Normal appearance. She is normal weight. She is not ill-appearing, toxic-appearing or diaphoretic.  Cardiovascular:     Rate and Rhythm: Normal rate and regular rhythm.     Pulses: Normal pulses.     Heart sounds: Normal heart sounds. No murmur heard.  No friction rub. No gallop.   Pulmonary:     Effort: Pulmonary effort is normal. No respiratory distress.     Breath sounds: Normal breath sounds. No stridor. No wheezing, rhonchi or rales.  Chest:     Chest wall: No tenderness.  Skin:    General: Skin is warm.     Findings: Abscess and erythema present.     Comments: Skin abscess of left groin   Neurological:     Mental Status: She is alert.     Procedure:  ASSESSMENT & PLAN:  1. Abscess of groin     Meds ordered this encounter  Medications  . doxycycline (VIBRAMYCIN) 100 MG capsule    Sig: Take 1 capsule (100 mg total) by mouth 2 (two) times daily.    Dispense:  20 capsule    Refill:  0   Patient is stable at discharge.  I&D was not completed on this visit as patient would like to try antibiotic treatment first.   Discharge instructions Apply warm compresses 3-4x daily for 10-15 minutes Wash site daily with warm water and mild soap Keep covered to avoid friction Take antibiotic as prescribed  and to completion Follow up here or with PCP if symptoms persists Return or go to the ED if you have any new or worsening symptoms increased redness, swelling, pain, nausea, vomiting, fever, chills, etc...     Reviewed expectations re: course of current medical issues. Questions answered. Outlined signs and symptoms indicating need for more acute intervention. Patient verbalized understanding. After  Visit Summary given.    Note: This document was prepared using Dragon voice recognition software and may include unintentional dictation errors.        Durward Parcel, FNP 12/18/19 1445

## 2019-12-18 NOTE — Discharge Instructions (Addendum)
Apply warm compresses 3-4x daily for 10-15 minutes Wash site daily with warm water and mild soap Keep covered to avoid friction Take antibiotic as prescribed and to completion Follow up here or with PCP if symptoms persists Return or go to the ED if you have any new or worsening symptoms increased redness, swelling, pain, nausea, vomiting, fever, chills, etc...  

## 2019-12-18 NOTE — ED Triage Notes (Signed)
abscess on LT inner thigh x 3 days ago.  Pt said it has drained some but she is worried it is infected.

## 2020-01-17 ENCOUNTER — Other Ambulatory Visit: Payer: Self-pay

## 2020-01-17 ENCOUNTER — Ambulatory Visit
Admission: EM | Admit: 2020-01-17 | Discharge: 2020-01-17 | Disposition: A | Discharge status: Home / Self Care | Payer: Medicaid Other | Attending: Emergency Medicine | Admitting: Emergency Medicine

## 2020-01-17 ENCOUNTER — Encounter: Payer: Self-pay | Admitting: Emergency Medicine

## 2020-01-17 DIAGNOSIS — J208 Acute bronchitis due to other specified organisms: Secondary | ICD-10-CM

## 2020-01-17 MED ORDER — CETIRIZINE HCL 10 MG PO TABS: 10.0000 mg | ORAL_TABLET | Freq: Every day | ORAL | 0 refills | Status: DC

## 2020-01-17 MED ORDER — FLUTICASONE PROPIONATE 50 MCG/ACT NA SUSP: 1.0000 | Freq: Every day | NASAL | 0 refills | Status: DC

## 2020-01-17 MED ORDER — AZITHROMYCIN 250 MG PO TABS: 250.0000 mg | ORAL_TABLET | Freq: Every day | ORAL | 0 refills | Status: DC

## 2020-01-17 NOTE — ED Provider Notes (Signed)
[facility]   [id number] 01/17/20 Arrival Time: 0949  Chief Complaint  Patient presents with  . Nasal Congestion     SUBJECTIVE: History from: patient.  Marissa Friedman is a 24 y.o. female who presented to the urgent care for complaint of postnasal drainage with dark brown sputum, nasal congestion for the past 5 to 7 days. Got worse yesterday  Denies sick exposure to COVID, flu or strep.  Denies recent travel.  Denies aggravating or alleviating symptoms.  Denies previous COVID infection.   Denies fever, chills, fatigue,  rhinorrhea, sore throat, cough, SOB, wheezing, chest pain, nausea, vomiting, changes in bowel or bladder habits.    ROS: As per HPI.  All other pertinent ROS negative.      Past Medical History:  Diagnosis Date  . Anxiety    Past Surgical History:  Procedure Laterality Date  . NO PAST SURGERIES     Allergies  Allergen Reactions  . Penicillins Swelling    Childhood reaction Has patient had a PCN reaction causing immediate rash, facial/tongue/throat swelling, SOB or lightheadedness with hypotension: Unknown Has patient had a PCN reaction causing severe rash involving mucus membranes or skin necrosis: No Has patient had a PCN reaction that required hospitalization: No Has patient had a PCN reaction occurring within the last 10 years: No If all of the above answers are "NO", then may proceed with Cephalosporin use.  . Wellbutrin [Bupropion] Swelling    Pt unsure of reaction.- facial swelling- childhood   No current facility-administered medications on file prior to encounter.   Current Outpatient Medications on File Prior to Encounter  Medication Sig Dispense Refill  . acetaminophen (TYLENOL) 325 MG tablet Take 2 tablets (650 mg total) by mouth every 4 (four) hours as needed (for pain scale < 4). 30 tablet 0  . clotrimazole (LOTRIMIN) 1 % cream Apply 1 application topically 2 (two) times daily. 30 g 0  . clotrimazole (LOTRIMIN) 1 % cream Apply  to affected area 2 times daily 15 g 0  . doxycycline (VIBRAMYCIN) 100 MG capsule Take 1 capsule (100 mg total) by mouth 2 (two) times daily. 20 capsule 0  . ibuprofen (ADVIL) 600 MG tablet Take 1 tablet (600 mg total) by mouth every 6 (six) hours. 30 tablet 0  . megestrol (MEGACE) 40 MG tablet 3x5d, 2x5d, then 1 daily to help control vaginal bleeding. Stop taking when bleeding stops. 45 tablet 1  . prenatal vitamin w/FE, FA (PRENATAL 1 + 1) 27-1 MG TABS tablet Take 1 tablet by mouth daily at 12 noon. 30 each 12   Social History   Socioeconomic History  . Marital status: Married    Spouse name: Not on file  . Number of children: 3  . Years of education: Not on file  . Highest education level: Not on file  Occupational History  . Not on file  Tobacco Use  . Smoking status: Former Smoker    Packs/day: 0.00    Years: 5.00    Pack years: 0.00    Types: Cigarettes  . Smokeless tobacco: Never Used  Vaping Use  . Vaping Use: Never used  Substance and Sexual Activity  . Alcohol use: Yes    Comment: occasionally  . Drug use: No  . Sexual activity: Yes    Birth control/protection: Implant  Other Topics Concern  . Not on file  Social History Narrative  . Not on file   Social Determinants of Health   Financial Resource Strain: Low Risk   .  Difficulty of Paying Living Expenses: Not hard at all  Food Insecurity: No Food Insecurity  . Worried About Programme researcher, broadcasting/film/video in the Last Year: Never true  . Ran Out of Food in the Last Year: Never true  Transportation Needs: Unknown  . Lack of Transportation (Medical): No  . Lack of Transportation (Non-Medical): Not on file  Physical Activity:   . Days of Exercise per Week:   . Minutes of Exercise per Session:   Stress: No Stress Concern Present  . Feeling of Stress : Not at all  Social Connections:   . Frequency of Communication with Friends and Family:   . Frequency of Social Gatherings with Friends and Family:   . Attends Religious  Services:   . Active Member of Clubs or Organizations:   . Attends Banker Meetings:   Marland Kitchen Marital Status:   Intimate Partner Violence: Not At Risk  . Fear of Current or Ex-Partner: No  . Emotionally Abused: No  . Physically Abused: No  . Sexually Abused: No   Family History  Problem Relation Age of Onset  . Healthy Mother   . Healthy Father   . Hypertension Paternal Grandmother   . Stroke Other   . Coronary artery disease Other     OBJECTIVE:  Vitals:   01/17/20 0954  BP: 125/76  Pulse: 91  Resp: 16  Temp: 98.4 F (36.9 C)  TempSrc: Oral  SpO2: 98%     General appearance: alert; appears fatigued, but nontoxic, speaking in full sentences and managing own secretions HEENT: NCAT; Ears: EACs clear, TMs pearly gray with visible cone of light, without erythema; Eyes: PERRL, EOMI grossly; Nose: no obvious rhinorrhea; Throat: oropharynx clear, tonsils 1+ and mildly erythematous without white tonsillar exudates, uvula midline Neck: supple without LAD Lungs: CTA bilaterally without adventitious breath sounds; cough absent Heart: regular rate and rhythm.  Radial pulses 2+ symmetrical bilaterally Skin: warm and dry Psychological: alert and cooperative; normal mood and affect  LABS: No results found for this or any previous visit (from the past 24 hour(s)).   ASSESSMENT & PLAN:  1. Acute bronchitis due to other specified organisms     Meds ordered this encounter  Medications  . cetirizine (ZYRTEC ALLERGY) 10 MG tablet    Sig: Take 1 tablet (10 mg total) by mouth daily.    Dispense:  30 tablet    Refill:  0  . fluticasone (FLONASE) 50 MCG/ACT nasal spray    Sig: Place 1 spray into both nostrils daily for 14 days.    Dispense:  16 g    Refill:  0  . azithromycin (ZITHROMAX) 250 MG tablet    Sig: Take 1 tablet (250 mg total) by mouth daily. Take first 2 tablets together, then 1 every day until finished.    Dispense:  6 tablet    Refill:  0   Discharge  Instructions Get plenty of rest and push fluids Zyrtec D prescribed. Use daily for symptomatic relief Flonase was prescribed.  Use as directed Azithromycin was prescribed.   Drink warm or cool liquids, use throat lozenges, or popsicles to help alleviate symptoms Take OTC ibuprofen or tylenol as needed for pain Follow up with PCP if symptoms persists Return or go to ER if patient has any new or worsening symptoms such as fever, chills, nausea, vomiting, worsening sore throat, cough, abdominal pain, chest pain, changes in bowel or bladder habits, etc...  Reviewed expectations re: course of current medical issues. Questions  answered. Outlined signs and symptoms indicating need for more acute intervention. Patient verbalized understanding. After Visit Summary given.      Note: This document was prepared using Dragon voice recognition software and may include unintentional dictation errors.    Durward Parcel, FNP 01/17/20 1029

## 2020-01-17 NOTE — Discharge Instructions (Addendum)
Get plenty of rest and push fluids Zyrtec D prescribed. Use daily for symptomatic relief Flonase was prescribed.  Use as directed Azithromycin was prescribed.   Drink warm or cool liquids, use throat lozenges, or popsicles to help alleviate symptoms Take OTC ibuprofen or tylenol as needed for pain Follow up with PCP if symptoms persists Return or go to ER if patient has any new or worsening symptoms such as fever, chills, nausea, vomiting, worsening sore throat, cough, abdominal pain, chest pain, changes in bowel or bladder habits, etc..Marland Kitchen

## 2020-01-17 NOTE — ED Triage Notes (Signed)
Mucous, congestion and sore throat since yesterday

## 2020-02-17 ENCOUNTER — Ambulatory Visit
Admission: EM | Admit: 2020-02-17 | Discharge: 2020-02-17 | Disposition: A | Discharge status: Home / Self Care | Payer: Medicaid Other | Attending: Emergency Medicine | Admitting: Emergency Medicine

## 2020-02-17 ENCOUNTER — Other Ambulatory Visit: Payer: Self-pay

## 2020-02-17 DIAGNOSIS — L03011 Cellulitis of right finger: Secondary | ICD-10-CM | POA: Diagnosis not present

## 2020-02-17 MED ORDER — DOXYCYCLINE HYCLATE 100 MG PO CAPS: 100.0000 mg | ORAL_CAPSULE | Freq: Two times a day (BID) | ORAL | 0 refills | Status: DC

## 2020-02-17 NOTE — ED Triage Notes (Signed)
Pt has infected area to right thumb

## 2020-02-17 NOTE — ED Provider Notes (Signed)
[facility]   [id number] 02/17/20 Arrival Time: 1754   Chief Complaint  Patient presents with  . Abscess     SUBJECTIVE:  Marissa Friedman is a 24 y.o. female who presents to the urgent care with a complaint of infected right thumb for the past few days.  Denies any precipitating event.  Localized the pain to the right thumb.  Has tried OTC Tylenol/ibuprofen as needed for pain without relief.  Denies similar symptoms in the past.  Denies chills, fever, nausea, vomiting, diarrhea.   ROS: As per HPI.  All other pertinent ROS negative.     Past Medical History:  Diagnosis Date  . Anxiety    Past Surgical History:  Procedure Laterality Date  . NO PAST SURGERIES     Allergies  Allergen Reactions  . Penicillins Swelling    Childhood reaction Has patient had a PCN reaction causing immediate rash, facial/tongue/throat swelling, SOB or lightheadedness with hypotension: Unknown Has patient had a PCN reaction causing severe rash involving mucus membranes or skin necrosis: No Has patient had a PCN reaction that required hospitalization: No Has patient had a PCN reaction occurring within the last 10 years: No If all of the above answers are "NO", then may proceed with Cephalosporin use.  . Wellbutrin [Bupropion] Swelling    Pt unsure of reaction.- facial swelling- childhood   No current facility-administered medications on file prior to encounter.   Current Outpatient Medications on File Prior to Encounter  Medication Sig Dispense Refill  . acetaminophen (TYLENOL) 325 MG tablet Take 2 tablets (650 mg total) by mouth every 4 (four) hours as needed (for pain scale < 4). 30 tablet 0  . azithromycin (ZITHROMAX) 250 MG tablet Take 1 tablet (250 mg total) by mouth daily. Take first 2 tablets together, then 1 every day until finished. 6 tablet 0  . cetirizine (ZYRTEC ALLERGY) 10 MG tablet Take 1 tablet (10 mg total) by mouth daily. 30 tablet 0  . clotrimazole (LOTRIMIN) 1 %  cream Apply 1 application topically 2 (two) times daily. 30 g 0  . clotrimazole (LOTRIMIN) 1 % cream Apply to affected area 2 times daily 15 g 0  . fluticasone (FLONASE) 50 MCG/ACT nasal spray Place 1 spray into both nostrils daily for 14 days. 16 g 0  . ibuprofen (ADVIL) 600 MG tablet Take 1 tablet (600 mg total) by mouth every 6 (six) hours. 30 tablet 0  . megestrol (MEGACE) 40 MG tablet 3x5d, 2x5d, then 1 daily to help control vaginal bleeding. Stop taking when bleeding stops. 45 tablet 1  . prenatal vitamin w/FE, FA (PRENATAL 1 + 1) 27-1 MG TABS tablet Take 1 tablet by mouth daily at 12 noon. 30 each 12   Social History   Socioeconomic History  . Marital status: Married    Spouse name: Not on file  . Number of children: 3  . Years of education: Not on file  . Highest education level: Not on file  Occupational History  . Not on file  Tobacco Use  . Smoking status: Former Smoker    Packs/day: 0.00    Years: 5.00    Pack years: 0.00    Types: Cigarettes  . Smokeless tobacco: Never Used  Vaping Use  . Vaping Use: Never used  Substance and Sexual Activity  . Alcohol use: Yes    Comment: occasionally  . Drug use: No  . Sexual activity: Yes    Birth control/protection: Implant  Other Topics Concern  .  Not on file  Social History Narrative  . Not on file   Social Determinants of Health   Financial Resource Strain:   . Difficulty of Paying Living Expenses:   Food Insecurity:   . Worried About Programme researcher, broadcasting/film/video in the Last Year:   . Barista in the Last Year:   Transportation Needs:   . Freight forwarder (Medical):   Marland Kitchen Lack of Transportation (Non-Medical):   Physical Activity:   . Days of Exercise per Week:   . Minutes of Exercise per Session:   Stress:   . Feeling of Stress :   Social Connections:   . Frequency of Communication with Friends and Family:   . Frequency of Social Gatherings with Friends and Family:   . Attends Religious Services:   .  Active Member of Clubs or Organizations:   . Attends Banker Meetings:   Marland Kitchen Marital Status:   Intimate Partner Violence:   . Fear of Current or Ex-Partner:   . Emotionally Abused:   Marland Kitchen Physically Abused:   . Sexually Abused:    Family History  Problem Relation Age of Onset  . Healthy Mother   . Healthy Father   . Hypertension Paternal Grandmother   . Stroke Other   . Coronary artery disease Other     OBJECTIVE:  Vitals:   02/17/20 1844  BP: (!) 104/58  Pulse: 66  Resp: 20  Temp: 99.2 F (37.3 C)  SpO2: 98%     Physical Exam Vitals and nursing note reviewed.  Constitutional:      General: She is not in acute distress.    Appearance: Normal appearance. She is normal weight. She is not ill-appearing, toxic-appearing or diaphoretic.  HENT:     Head: Normocephalic.  Cardiovascular:     Rate and Rhythm: Normal rate and regular rhythm.     Pulses: Normal pulses.     Heart sounds: Normal heart sounds. No murmur heard.  No friction rub. No gallop.   Pulmonary:     Effort: Pulmonary effort is normal. No respiratory distress.     Breath sounds: Normal breath sounds. No stridor. No wheezing, rhonchi or rales.  Chest:     Chest wall: No tenderness.  Skin:    General: Skin is warm.     Findings: Abscess and erythema present.     Comments: Paronychia present on right thumb  Neurological:     Mental Status: She is alert and oriented to person, place, and time.      ASSESSMENT & PLAN:  1. Paronychia of right thumb     Meds ordered this encounter  Medications  . doxycycline (VIBRAMYCIN) 100 MG capsule    Sig: Take 1 capsule (100 mg total) by mouth 2 (two) times daily.    Dispense:  20 capsule    Refill:  0    Discharge instructions Perform frequent warm soak to help facilitate drainage Wash daily with warm water and mild soap Keep covered to avoid secondary infection Keflex was prescribed Use OTC Neosporin as needed Use OTC ibuprofen/tylenol as  needed for pain  Return or go to the ED if you have any new or worsening symptoms such as worsening toe pain, nausea, vomiting, increased redness, swelling, fever, chills, etc...  Reviewed expectations re: course of current medical issues. Questions answered. Outlined signs and symptoms indicating need for more acute intervention. Patient verbalized understanding. After Visit Summary given.      Note: This document  was prepared using Conservation officer, historic buildings and may include unintentional dictation errors.     Durward Parcel, FNP 02/17/20 1913

## 2020-02-17 NOTE — Discharge Instructions (Addendum)
Perform frequent warm soak to help facilitate drainage Wash daily with warm water and mild soap Keep covered to avoid secondary infection Keflex was prescribed Use OTC Neosporin as needed Use OTC ibuprofen/tylenol as needed for pain  Return or go to the ED if you have any new or worsening symptoms such as worsening toe pain, nausea, vomiting, increased redness, swelling, fever, chills, etc..

## 2020-02-22 ENCOUNTER — Encounter (HOSPITAL_COMMUNITY): Payer: Self-pay

## 2020-02-22 ENCOUNTER — Other Ambulatory Visit: Payer: Self-pay

## 2020-02-22 DIAGNOSIS — R221 Localized swelling, mass and lump, neck: Secondary | ICD-10-CM | POA: Insufficient documentation

## 2020-02-22 DIAGNOSIS — Z5321 Procedure and treatment not carried out due to patient leaving prior to being seen by health care provider: Secondary | ICD-10-CM | POA: Insufficient documentation

## 2020-02-22 NOTE — ED Triage Notes (Signed)
Pt to er, pt states that she noticed a bump on the back of her neck today, states that it hurts, states that it feels like it has gotten bigger throughout the day. Pt has grape sized mass on the back of her neck

## 2020-02-23 ENCOUNTER — Encounter: Payer: Self-pay | Admitting: Obstetrics & Gynecology

## 2020-02-23 ENCOUNTER — Emergency Department (HOSPITAL_COMMUNITY)
Admission: EM | Admit: 2020-02-23 | Discharge: 2020-02-23 | Disposition: A | Discharge status: Home / Self Care | Payer: Medicaid Other | Attending: Emergency Medicine | Admitting: Emergency Medicine

## 2020-02-23 ENCOUNTER — Ambulatory Visit (INDEPENDENT_AMBULATORY_CARE_PROVIDER_SITE_OTHER): Payer: Medicaid Other | Admitting: Obstetrics & Gynecology

## 2020-02-23 VITALS — BP 105/68 | HR 78 | Ht 64.0 in | Wt 212.0 lb

## 2020-02-23 DIAGNOSIS — Z3046 Encounter for surveillance of implantable subdermal contraceptive: Secondary | ICD-10-CM

## 2020-02-23 NOTE — Progress Notes (Signed)
Pt wanted nexplanon removed due to an assoication with increased anxiety  The left arm was prepped 1% lidocaine was injected Stab incision made Hemostat used and the device was removed intact without difficulty steristrips and bandage placed  Pt does not want any form of birth control at the present  Lazaro Arms, MD 02/23/2020 4:10 PM

## 2020-03-09 ENCOUNTER — Other Ambulatory Visit: Payer: Self-pay

## 2020-03-09 ENCOUNTER — Ambulatory Visit
Admission: EM | Admit: 2020-03-09 | Discharge: 2020-03-09 | Disposition: A | Discharge status: Home / Self Care | Payer: Medicaid Other | Attending: Emergency Medicine | Admitting: Emergency Medicine

## 2020-03-09 ENCOUNTER — Encounter: Payer: Self-pay | Admitting: Emergency Medicine

## 2020-03-09 DIAGNOSIS — R590 Localized enlarged lymph nodes: Secondary | ICD-10-CM

## 2020-03-09 DIAGNOSIS — L03012 Cellulitis of left finger: Secondary | ICD-10-CM

## 2020-03-09 MED ORDER — IBUPROFEN 800 MG PO TABS: 800.0000 mg | ORAL_TABLET | Freq: Three times a day (TID) | ORAL | 0 refills | Status: DC

## 2020-03-09 MED ORDER — CLINDAMYCIN HCL 300 MG PO CAPS: 300.0000 mg | ORAL_CAPSULE | Freq: Three times a day (TID) | ORAL | 0 refills | Status: AC

## 2020-03-09 NOTE — Discharge Instructions (Signed)
Begin clindamycin x 1 week Use anti-inflammatories for pain/swelling. You may take up to 800 mg Ibuprofen every 8 hours with food. You may supplement Ibuprofen with Tylenol [phone] mg every 8 hours.  Warm compresses/soaks Follow up with primary care

## 2020-03-09 NOTE — ED Triage Notes (Signed)
Abscess to LT index finger x 3 days.  Also has tender area under RT axilla

## 2020-03-10 NOTE — ED Provider Notes (Signed)
[facility]    CSN: [id number] Arrival date & time: 03/09/20  1928      History   Chief Complaint Chief Complaint  Patient presents with  . Abscess    HPI Marissa Friedman is a 24 y.o. female presenting today for evaluation of abscess and swollen lymph node.  Patient reports over the past 3 days she has developed increased pain swelling and redness to her left index finger.  Denies any spontaneous drainage.  Has had history of similar.  Reports frequently bites her nails.  She is also concerned about a swollen lymph node under her right axilla.  Has been present for approximately 3 weeks, but increased in size and discomfort in the past couple of days.  She denies any fevers.  Denies chest pain or shortness of breath.  HPI  Past Medical History:  Diagnosis Date  . Anxiety     Patient Active Problem List   Diagnosis Date Noted  . Post-dates pregnancy 02/09/2019  . Asymptomatic bacteriuria during pregnancy in second trimester 09/15/2018  . Supervision of normal pregnancy 09/08/2018  . Late prenatal care in second trimester 09/08/2018  . Short interval between pregnancies affecting pregnancy in first trimester, antepartum 07/07/2018  . Rubella non-immune status, antepartum 04/26/2017    Past Surgical History:  Procedure Laterality Date  . NO PAST SURGERIES      OB History    Gravida  4   Para  4   Term  4   Preterm      AB      Living  4     SAB      TAB      Ectopic      Multiple  0   Live Births  4        Obstetric Comments  [facility]         Home Medications    Prior to Admission medications   Medication Sig Start Date End Date Taking? Authorizing Provider  clindamycin (CLEOCIN) 300 MG capsule Take 1 capsule (300 mg total) by mouth 3 (three) times daily for 7 days. 03/09/20 03/16/20  Alliyah Roesler C, PA-C  ibuprofen (ADVIL) 800 MG tablet Take 1 tablet (800 mg total) by mouth 3 (three) times daily. 03/09/20    Tyrah Broers C, PA-C  fluticasone (FLONASE) 50 MCG/ACT nasal spray Place 1 spray into both nostrils daily for 14 days. 01/17/20 03/09/20  Durward Parcel, FNP    Family History Family History  Problem Relation Age of Onset  . Healthy Mother   . Healthy Father   . Hypertension Paternal Grandmother   . Stroke Other   . Coronary artery disease Other     Social History Social History   Tobacco Use  . Smoking status: Former Smoker    Packs/day: 0.00    Years: 5.00    Pack years: 0.00    Types: Cigarettes  . Smokeless tobacco: Never Used  Vaping Use  . Vaping Use: Never used  Substance Use Topics  . Alcohol use: Yes  . Drug use: No     Allergies   Penicillins and Wellbutrin [bupropion]   Review of Systems Review of Systems  Constitutional: Negative for fatigue and fever.  HENT: Negative for mouth sores.   Eyes: Negative for visual disturbance.  Respiratory: Negative for shortness of breath.   Cardiovascular: Negative for chest pain.  Gastrointestinal: Negative for abdominal pain, nausea and vomiting.  Genitourinary: Negative for genital sores.  Musculoskeletal: Positive for joint swelling. Negative for arthralgias.  Skin: Positive for color change. Negative for rash and wound.  Neurological: Negative for dizziness, weakness, light-headedness and headaches.  Hematological: Positive for adenopathy.     Physical Exam Triage Vital Signs ED Triage Vitals [03/09/20 2036]  Enc Vitals Group     BP 110/73     Pulse Rate 96     Resp 19     Temp 98.6 F (37 C)     Temp Source Oral     SpO2 97 %     Weight 211 lb 10.3 oz (96 kg)     Height 5\' 4"  (1.626 m)     Head Circumference      Peak Flow      Pain Score 8     Pain Loc      Pain Edu?      Excl. in GC?    No data found.  Updated Vital Signs BP 110/73 (BP Location: Right Arm)   Pulse 96   Temp 98.6 F (37 C) (Oral)   Resp 19   Ht 5\' 4"  (1.626 m)   Wt 211 lb 10.3 oz (96 kg)   SpO2 97%   BMI 36.33  kg/m   Visual Acuity Right Eye Distance:   Left Eye Distance:   Bilateral Distance:    Right Eye Near:   Left Eye Near:    Bilateral Near:     Physical Exam Vitals and nursing note reviewed.  Constitutional:      Appearance: She is well-developed.     Comments: No acute distress  HENT:     Head: Normocephalic and atraumatic.     Nose: Nose normal.  Eyes:     Conjunctiva/sclera: Conjunctivae normal.  Cardiovascular:     Rate and Rhythm: Normal rate.  Pulmonary:     Effort: Pulmonary effort is normal. No respiratory distress.  Abdominal:     General: There is no distension.  Musculoskeletal:        General: Normal range of motion.     Cervical back: Neck supple.     Comments: Full active range of motion of left index finger  Skin:    General: Skin is warm and dry.     Comments: Left index finger with small amount of swelling redness and superficial area of pus around the cuticle and around lateral nail fold, does not extend into distal finger pulp  Lymphadenopathy present right axilla, and rubbery, no overlying erythema  Neurological:     Mental Status: She is alert and oriented to person, place, and time.      UC Treatments / Results  Labs (all labs ordered are listed, but only abnormal results are displayed) Labs Reviewed - No data to display  EKG   Radiology No results found.  Procedures Incision and Drainage  Date/Time: 03/10/2020 9:25 AM Performed by: Adisen Bennion, [place] C, PA-C Authorized by: Minsa Weddington, 05/10/2020, PA-C   Consent:    Consent obtained:  Verbal   Consent given by:  Patient   Risks discussed:  Pain and incomplete drainage Location:    Indications for incision and drainage: paronychia.   Location:  Upper extremity   Upper extremity location:  Finger   Finger location:  L index finger Pre-procedure details:    Skin preparation:  Betadine Anesthesia (see MAR for exact dosages):    Anesthesia method:  None Procedure type:    Complexity:   Simple Procedure details:    Incision types:  Stab incision   Incision depth:  Dermal   Scalpel blade:  11   Drainage:  Purulent   Drainage amount:  Moderate   Wound treatment:  Wound left open   Packing materials:  None Post-procedure details:    Patient tolerance of procedure:  Tolerated well, no immediate complications   (including critical care time)    Medications Ordered in UC Medications - No data to display  Initial Impression / Assessment and Plan / UC Course  I have reviewed the triage vital signs and the nursing notes.  Pertinent labs & imaging results that were available during my care of the patient were reviewed by me and considered in my medical decision making (see chart for details).    1.  Paronychia-I&D performed with drainage, continue soaks, initiating on clindamycin, has penicillin allergy.  Anti-inflammatories and monitor for gradual resolution.  No sign of felon at this time.  2.  Axillary lymphadenopathy-recommending anti-inflammatories and warm compresses, follow-up with primary care for ultrasound/imaging if continuing to remain enlarged or continuing to increase in size.  Discussed strict return precautions. Patient verbalized understanding and is agreeable with plan.   Final Clinical Impressions(s) / UC Diagnoses   Final diagnoses:  Paronychia of left index finger  Lymphadenopathy, axillary     Discharge Instructions     Begin clindamycin x 1 week Use anti-inflammatories for pain/swelling. You may take up to 800 mg Ibuprofen every 8 hours with food. You may supplement Ibuprofen with Tylenol [phone] every 8 hours.  Warm compresses/soaks Follow up with primary care   ED Prescriptions    Medication Sig Dispense Auth. Provider   clindamycin (CLEOCIN) 300 MG capsule Take 1 capsule (300 mg total) by mouth 3 (three) times daily for 7 days. 21 capsule Alyx Mcguirk C, PA-C   ibuprofen (ADVIL) 800 MG tablet Take 1 tablet (800 mg total) by  mouth 3 (three) times daily. 21 tablet Laiden Milles, [place] C, PA-C     PDMP not reviewed this encounter.   Lew Dawes, [place] 03/10/20 [phone]

## 2020-03-22 ENCOUNTER — Ambulatory Visit: Payer: Medicaid Other | Admitting: Internal Medicine

## 2020-03-22 ENCOUNTER — Encounter: Payer: Self-pay | Admitting: Internal Medicine

## 2020-03-22 ENCOUNTER — Encounter: Payer: Self-pay | Admitting: *Deleted

## 2020-03-22 ENCOUNTER — Other Ambulatory Visit: Payer: Self-pay

## 2020-03-22 VITALS — BP 109/76 | HR 71 | Temp 98.2°F | Ht 64.0 in | Wt 212.3 lb

## 2020-03-22 DIAGNOSIS — R590 Localized enlarged lymph nodes: Secondary | ICD-10-CM | POA: Insufficient documentation

## 2020-03-22 DIAGNOSIS — Z0001 Encounter for general adult medical examination with abnormal findings: Secondary | ICD-10-CM

## 2020-03-22 DIAGNOSIS — F411 Generalized anxiety disorder: Secondary | ICD-10-CM | POA: Insufficient documentation

## 2020-03-22 DIAGNOSIS — R591 Generalized enlarged lymph nodes: Secondary | ICD-10-CM

## 2020-03-22 DIAGNOSIS — Z0000 Encounter for general adult medical examination without abnormal findings: Secondary | ICD-10-CM

## 2020-03-22 MED ORDER — CITALOPRAM HYDROBROMIDE 10 MG PO TABS: ORAL_TABLET | ORAL | 0 refills | Status: DC

## 2020-03-22 NOTE — Progress Notes (Signed)
New Patient Office Visit  Subjective:  Patient ID: Marissa Friedman, female    DOB: 12/07/1995  Age: 24 y.o. MRN: [id number]  CC:  Chief Complaint  Patient presents with  . Cyst    under right arm   . Anxiety    HPI Marissa Friedman presents for evaluation of the above concerns. She would also like to establish care with our practice.  3-4w hx of right axillary nodule. First noticed when she developed an abscess of her right thumb. She received a course of antibiotics and her thumb has since healed. She notes that the right axillary nodule has persisted. It's size fluctuates. It is generally nontender but does become tender after she continues to touch it. She denies any fevers, chills or other systemic symptoms over the past 2 weeks. She denies any drainage, erythema, or swelling of the area. She denies family history or personal history of breast cancer.  She also notes a history of generalized anxiety disorder.  She is previously well managed on Celexa.  She notes that she has been struggling with anxiety again over the past few months.  Symptoms include difficulty sleeping, restlessness, difficulty concentrating.  She denies a history of bipolar disorder.  She denies suicidal thoughts.   Past Medical History:  Diagnosis Date  . Anxiety     Past Surgical History:  Procedure Laterality Date  . NO PAST SURGERIES      Family History  Problem Relation Age of Onset  . Healthy Mother   . Healthy Father   . Hypertension Paternal Grandmother   . Stroke Other   . Coronary artery disease Other    Social History: resides in [place], [place] mother to 3 children. Denies use of tobacco, alcohol or illicit drugs.   ROS Review of Systems  Constitutional: Negative for chills and fever.  HENT: Negative.   Respiratory: Negative.   Cardiovascular: Negative.   Gastrointestinal: Negative.   Genitourinary: Negative.   Musculoskeletal: Negative.   Neurological:  Negative.   Hematological: Positive for adenopathy.  Psychiatric/Behavioral: Positive for decreased concentration. The patient is nervous/anxious.     Objective:   Today's Vitals: BP 109/76 (BP Location: Left Arm, Patient Position: Sitting, Cuff Size: Large)   Pulse 71   Temp 98.2 F (36.8 C) (Oral)   Ht 5\' 4"  (1.626 m)   Wt 212 lb 4.8 oz (96.3 kg)   LMP 03/12/2020   SpO2 99% Comment: room air  BMI 36.44 kg/m   Physical Exam Constitutional:      Appearance: Normal appearance. She is not ill-appearing.  HENT:     Head: Normocephalic.  Cardiovascular:     Rate and Rhythm: Normal rate and regular rhythm.  Pulmonary:     Effort: Pulmonary effort is normal.     Breath sounds: Normal breath sounds.  Chest:    Abdominal:     General: There is no distension.  Musculoskeletal:        General: Normal range of motion.  Lymphadenopathy:     Upper Body:     Right upper body: Axillary adenopathy present.     Comments: Firm nodule located in right axilla. Minimally mobile. Located in the anterior axillary line.  Skin:    General: Skin is warm and dry.     Findings: No erythema, lesion or rash.  Neurological:     General: No focal deficit present.  Psychiatric:        Mood and Affect: Mood normal.  Thought Content: Thought content does not include suicidal ideation. Thought content does not include suicidal plan.     Assessment & Plan:   Problem List Items Addressed This Visit      Immune and Lymphatic   Lymphadenopathy - Primary    The office visit today was for evaluation of the right axillary nodule.  This feels like a lymph node but is not very mobile.  Of note, this was first noted at the same time that she initially had an abscess of her right thumb and she received a course of antibiotics and that is since resolved.  The nodule has remained persistent however. Dr. Mikey Bussing and I did do a point-of-care ultrasound of this.  According to the ultrasound, it does seem  to be subcutaneous and a bit more superficial than I would expect in a lymph node. I suspect that this is a reactive lymph node from her prior finger infection.  Possibly a abscess of that lymph node which is slowly improving. She denies a personal or family history of breast cancer however I do think that this nodule does need further evaluation. Plan --Ultrasound of right breast with axilla ordered for further evaluation.      Relevant Orders   US BREAST LTD UNI RIGHT INC AXILLA     Other   Healthcare maintenance    Up-to-date on cervical cancer screening. Not interested in influenza or Covid vaccinations today.      Generalized anxiety disorder    Previously well managed on Celexa but has been off of it for a couple of years.  Patient notes recurrent symptoms at today's visit. Plan --We will restart her on Celexa today.  10 mg for 7 days followed by 20 mg thereafter. --Follow-up in 4 weeks --Patient agreeable to counseling.  Referral order placed      Relevant Medications   citalopram (CELEXA) 10 MG tablet   Other Relevant Orders   Ambulatory referral to [facility] (Completed)      Outpatient Encounter Medications as of 03/22/2020  Medication Sig  . citalopram (CELEXA) 10 MG tablet Take 1 tablet (10 mg total) by mouth daily for 7 days, THEN 2 tablets (20 mg total) daily for 21 days.  Marland Kitchen ibuprofen (ADVIL) 800 MG tablet Take 1 tablet (800 mg total) by mouth 3 (three) times daily.  . [DISCONTINUED] fluticasone (FLONASE) 50 MCG/ACT nasal spray Place 1 spray into both nostrils daily for 14 days.   No facility-administered encounter medications on file as of 03/22/2020.    Follow-up: Return in about 4 weeks (around 04/19/2020).   Pt discussed with Dr. Phyllis Ginger, MD Internal Medicine Resident PGY-2 [facility] Pager: [phone] 03/23/2020 10:48 AM

## 2020-03-22 NOTE — Patient Instructions (Signed)
It was a pleasure meeting you today. I have placed an order for an ultrasound of your right armpit and breast. I suspect that this is a remnant from your finger infection but I would like to rule out other things. I have also sent celexa to your pharmacy. Start with one pill for a week and then increase to 2 pills after that. I would like you to arrange a follow up appointment in 4 weeks so we can check on your symptoms. I have also placed a referral to counseling.

## 2020-03-23 ENCOUNTER — Encounter: Payer: Self-pay | Admitting: Internal Medicine

## 2020-03-23 NOTE — Assessment & Plan Note (Signed)
Previously well managed on Celexa but has been off of it for a couple of years.  Patient notes recurrent symptoms at today's visit. Plan --We will restart her on Celexa today.  10 mg for 7 days followed by 20 mg thereafter. --Follow-up in 4 weeks --Patient agreeable to counseling.  Referral order placed

## 2020-03-23 NOTE — Assessment & Plan Note (Signed)
The office visit today was for evaluation of the right axillary nodule.  This feels like a lymph node but is not very mobile.  Of note, this was first noted at the same time that she initially had an abscess of her right thumb and she received a course of antibiotics and that is since resolved.  The nodule has remained persistent however. Dr. Mikey Bussing and I did do a point-of-care ultrasound of this.  According to the ultrasound, it does seem to be subcutaneous and a bit more superficial than I would expect in a lymph node. I suspect that this is a reactive lymph node from her prior finger infection.  Possibly a abscess of that lymph node which is slowly improving. She denies a personal or family history of breast cancer however I do think that this nodule does need further evaluation. Plan --Ultrasound of right breast with axilla ordered for further evaluation.

## 2020-03-23 NOTE — Assessment & Plan Note (Signed)
Up-to-date on cervical cancer screening. Not interested in influenza or Covid vaccinations today.

## 2020-03-29 ENCOUNTER — Other Ambulatory Visit: Payer: Self-pay

## 2020-03-29 ENCOUNTER — Ambulatory Visit (HOSPITAL_COMMUNITY)
Admission: RE | Admit: 2020-03-29 | Discharge: 2020-03-29 | Disposition: A | Discharge status: Home / Self Care | Payer: Medicaid Other | Source: Ambulatory Visit | Attending: Internal Medicine | Admitting: Internal Medicine

## 2020-03-29 DIAGNOSIS — R591 Generalized enlarged lymph nodes: Secondary | ICD-10-CM | POA: Diagnosis present

## 2020-04-03 ENCOUNTER — Encounter: Payer: Self-pay | Admitting: Internal Medicine

## 2020-04-13 NOTE — Progress Notes (Signed)
Internal Medicine Clinic Attending  Case discussed with Dr. Christian  At the time of the visit.  We reviewed the resident's history and exam and pertinent patient test results.  I agree with the assessment, diagnosis, and plan of care documented in the resident's note.  

## 2020-04-19 ENCOUNTER — Encounter: Payer: Medicaid Other | Admitting: Student

## 2020-05-22 ENCOUNTER — Encounter: Payer: Self-pay | Admitting: Internal Medicine

## 2020-05-24 NOTE — Telephone Encounter (Signed)
*  I can place a referral if needed*

## 2020-05-30 ENCOUNTER — Other Ambulatory Visit: Payer: Self-pay | Admitting: Internal Medicine

## 2020-05-30 DIAGNOSIS — Z3009 Encounter for other general counseling and advice on contraception: Secondary | ICD-10-CM

## 2020-06-05 NOTE — Progress Notes (Deleted)
   Office Visit   Patient ID: Marissa Friedman, female    DOB: 1995/09/18, 24 y.o.   MRN: [id number]  Subjective:  CC: axillary adenopathy follow up, generalized anxiety disorder  HPI 24 y.o. presents today for the above.  Axillary adenopathy I first saw Marissa Friedman on 03/22/20 for 3-4w history of a right axillary nodule. I obtained an ultrasound of this which showed some mild cortical thickening of the axillary lymph node which was presumed to be reactive to a recent right thumb abscess however it was recommended that Marissa Friedman undergo another ultrasound in 33mo to ensure improvement.   Generalized anxiety disorder At Marissa Friedman LOV with me 9/21, we had also restarted Marissa Friedman celexa      ACTIVE MEDICATIONS   Current Outpatient Medications on File Prior to Visit  Medication Sig Dispense Refill  . citalopram (CELEXA) 10 MG tablet Take 1 tablet (10 mg total) by mouth daily for 7 days, THEN 2 tablets (20 mg total) daily for 21 days. 49 tablet 0  . ibuprofen (ADVIL) 800 MG tablet Take 1 tablet (800 mg total) by mouth 3 (three) times daily. 21 tablet 0  . [DISCONTINUED] fluticasone (FLONASE) 50 MCG/ACT nasal spray Place 1 spray into both nostrils daily for 14 days. 16 g 0   No current facility-administered medications on file prior to visit.    ROS  Review of Systems  Objective:   There were no vitals taken for this visit. Wt Readings from Last 3 Encounters:  03/22/20 212 lb 4.8 oz (96.3 kg)  03/09/20 211 lb 10.3 oz (96 kg)  02/23/20 212 lb (96.2 kg)   BP Readings from Last 3 Encounters:  03/22/20 109/76  03/09/20 110/73  02/23/20 105/68   Physical Exam  Health Maintenance:   Health Maintenance  Topic Date Due  . Hepatitis C Screening  Never done  . COVID-19 Vaccine (1) Never done  . INFLUENZA VACCINE  01/31/2020  . PAP-Cervical Cytology Screening  09/07/2021  . PAP SMEAR-Modifier  09/07/2021  . TETANUS/TDAP  12/07/2028  . HIV Screening  Completed     Assessment & Plan:   Problem List  Items Addressed This Visit    None        Pt discussed with ***  Elige Radon, MD Internal Medicine Resident PGY-2 [facility] Pager: [phone] 06/05/2020 10:46 AM

## 2020-06-05 NOTE — Assessment & Plan Note (Deleted)
At her LOV with me 9/21, we had restarted her celexa.

## 2020-06-05 NOTE — Assessment & Plan Note (Deleted)
I first saw her on 03/22/20 for 3-4w history of a right axillary nodule. I obtained an ultrasound of this which showed some mild cortical thickening of the axillary lymph node which was presumed to be reactive to a recent right thumb abscess however it was recommended that she undergo another ultrasound in 36mo to ensure improvement.   Plan -right axillary [place] ordered for follow up as recommended

## 2020-06-06 ENCOUNTER — Encounter: Payer: Medicaid Other | Admitting: Internal Medicine

## 2020-06-06 ENCOUNTER — Telehealth: Payer: Self-pay | Admitting: *Deleted

## 2020-06-06 DIAGNOSIS — R590 Localized enlarged lymph nodes: Secondary | ICD-10-CM

## 2020-06-06 DIAGNOSIS — F411 Generalized anxiety disorder: Secondary | ICD-10-CM

## 2020-06-06 NOTE — Telephone Encounter (Signed)
Spoke to patient this morning about missed appointment.  Patiet ststed has a Cold and would like to reschedule for next week.  Appointment was rescheduled for 06/13/2020.  Angelina Ok, RN. 12/6 /2021 10:10 AM.

## 2020-06-13 ENCOUNTER — Encounter: Payer: Medicaid Other | Admitting: Internal Medicine

## 2020-06-18 NOTE — Progress Notes (Deleted)
   Office Visit   Patient ID: LADEANA LAPLANT, female    DOB: 01/21/1996, 24 y.o.   MRN: [id number]  Subjective:  CC: axillary lymphadenopathy follow up  HPI 24 y.o. presents today for follow up from her 03/22/20 visit with me for an enlarged right axillary lymph node. She had recently had a right thumb abscess in the weeks prior to that visit. I had obtained an ultrasound of the right axilla regardless, for further evaluation. This had shown some mild cortical thickening of that node. No sonographic abnormalities were noted of the RUQ breast region. They recommended f/u ultrasound in 3 months to ensure improvement.  We had also started celexa, which she had been on in the past, for anxiety so she is here for follow up of that as well.      ACTIVE MEDICATIONS   Outpatient Medications Prior to Visit  Medication Sig Dispense Refill  . citalopram (CELEXA) 10 MG tablet Take 1 tablet (10 mg total) by mouth daily for 7 days, THEN 2 tablets (20 mg total) daily for 21 days. 49 tablet 0  . ibuprofen (ADVIL) 800 MG tablet Take 1 tablet (800 mg total) by mouth 3 (three) times daily. 21 tablet 0   No facility-administered medications prior to visit.     ROS  Review of Systems  Objective:   There were no vitals taken for this visit. Wt Readings from Last 3 Encounters:  03/22/20 212 lb 4.8 oz (96.3 kg)  03/09/20 211 lb 10.3 oz (96 kg)  02/23/20 212 lb (96.2 kg)   BP Readings from Last 3 Encounters:  03/22/20 109/76  03/09/20 110/73  02/23/20 105/68   Physical Exam  Health Maintenance:   Health Maintenance  Topic Date Due  . Hepatitis C Screening  Never done  . COVID-19 Vaccine (1) Never done  . INFLUENZA VACCINE  01/31/2020  . PAP-Cervical Cytology Screening  09/07/2021  . PAP SMEAR-Modifier  09/07/2021  . TETANUS/TDAP  12/07/2028  . HIV Screening  Completed     Assessment & Plan:   Problem List Items Addressed This Visit      Other   Healthcare maintenance (Chronic)    Generalized anxiety disorder (Chronic)    Current medications: celexa 10mg  daily            Pt discussed with ***  , MD Internal Medicine Resident PGY-2 [facility] Pager: [phone] 06/18/2020 7:18 PM

## 2020-06-18 NOTE — Assessment & Plan Note (Deleted)
Current medications: celexa 10mg  daily

## 2020-06-20 ENCOUNTER — Encounter: Payer: Medicaid Other | Admitting: Internal Medicine

## 2020-07-18 ENCOUNTER — Encounter: Payer: Medicaid Other | Admitting: Obstetrics and Gynecology

## 2020-08-02 ENCOUNTER — Encounter: Payer: Medicaid Other | Admitting: Obstetrics and Gynecology

## 2020-08-02 NOTE — Addendum Note (Signed)
Addended by: Neomia Dear on: 08/02/2020 11:56 AM   Modules accepted: Orders

## 2020-09-20 ENCOUNTER — Encounter: Payer: Self-pay | Admitting: Internal Medicine

## 2020-09-20 ENCOUNTER — Ambulatory Visit (INDEPENDENT_AMBULATORY_CARE_PROVIDER_SITE_OTHER): Payer: Medicaid Other | Admitting: Internal Medicine

## 2020-09-20 ENCOUNTER — Other Ambulatory Visit: Payer: Self-pay

## 2020-09-20 VITALS — BP 128/77 | HR 80 | Temp 98.1°F | Ht 64.0 in | Wt 238.8 lb

## 2020-09-20 DIAGNOSIS — R590 Localized enlarged lymph nodes: Secondary | ICD-10-CM

## 2020-09-20 DIAGNOSIS — Z0000 Encounter for general adult medical examination without abnormal findings: Secondary | ICD-10-CM | POA: Diagnosis not present

## 2020-09-20 DIAGNOSIS — F411 Generalized anxiety disorder: Secondary | ICD-10-CM | POA: Diagnosis not present

## 2020-09-20 MED ORDER — CITALOPRAM HYDROBROMIDE 10 MG PO TABS: ORAL_TABLET | ORAL | 0 refills | Status: DC

## 2020-09-20 NOTE — Progress Notes (Signed)
Annual Exam Office Visit  Subjective:  Patient ID: Marissa Friedman, female    DOB: 09-24-1995  Age: 25 y.o. MRN: [id number]  CC:  Chief Complaint  Patient presents with  . Annual Exam    Pt stated she is here for her annual and to make sure her shots are up to date for school    HPI Marissa Friedman is a 25 year old female with generalized anxiety disorder who presents for an annual exam in anticipation of beginning school next week. She has no new complaints at today's visit. 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  She is continuing to work on her diet.  She is sleeping ok at night.  She does not smoke cigarettes. She drinks alcohol socially. She does not use illicit drugs. She is up to date on vaccinations. She received 2 doses of the COVID 19 vaccination however they are not in our system. She will send [place] a picture of her card for date confirmation. She received the influenza vaccine at [facility] and will obtain those records in anticipation of starting school.   Past Medical History:  Diagnosis Date  . Anxiety     Past Surgical History:  Procedure Laterality Date  . NO PAST SURGERIES      Family History  Problem Relation Age of Onset  . Healthy Mother   . Healthy Father   . Hypertension Paternal Grandmother   . Stroke Other   . Coronary artery disease Other     Social History   Socioeconomic History  . Marital status: Married    Spouse name: Not on file  . Number of children: 3  . Years of education: Not on file  . Highest education level: Not on file  Occupational History  . Occupation: Stay at home mom  Tobacco Use  . Smoking status: Former Smoker    Packs/day: 0.00    Years: 5.00    Pack years: 0.00    Types: Cigarettes  . Smokeless tobacco: Never Used  Vaping Use  . Vaping Use: Never used  Substance and Sexual Activity  . Alcohol use: Not Currently  . Drug use: No  . Sexual activity: Yes    Birth  control/protection: Implant  Other Topics Concern  . Not on file  Social History Narrative  . Not on file   Social Determinants of Health   Financial Resource Strain: Not on file  Food Insecurity: Not on file  Transportation Needs: Not on file  Physical Activity: Not on file  Stress: Not on file  Social Connections: Not on file  Intimate Partner Violence: Not on file    Objective:   Today's Vitals: BP 128/77 (BP Location: Left Arm, Patient Position: Sitting, Cuff Size: Normal)   Pulse 80   Temp 98.1 F (36.7 C) (Oral)   Ht 5\' 4"  (1.626 m)   Wt 238 lb 12.8 oz (108.3 kg)   SpO2 99%   BMI 40.99 kg/m   Physical Exam General: well appearing in NAD Cardiac: RRR Pulm: breathing comfortably on room air, lung sounds clear  Assessment & Plan:   Problem List Items Addressed This Visit      Immune and Lymphatic   Axillary adenopathy - Primary    Axillary ultrasound from 03/2020 recommended a repeat ultrasound in 3 months so I have ordered this today.       Relevant Orders   04/2020 AXILLA RIGHT  Other   Generalized anxiety disorder (Chronic)    She notes having discontinued the celexa several months ago. She does continue to struggle with generalized anxiety symptoms and does feel like these were better controlled on celexa. She is agreeable to restarting this today. -celexa refilled and sent to her pharmacy. Discussed need to start at the lower dose and titrate up again with restarting -she will f/u with [place] via telehealth in 4-6w to let us know how her symptoms are doing      Relevant Medications   citalopram (CELEXA) 10 MG tablet   Visit for annual health examination    This encounter was for an exam prior to beginning school next week. She is up to date on age appropriate screening and vaccinations. She has received the Covid 19 vaccine series and will send [place] a message with the dates she received these. Last pap smear was in 2020 and reported to be NILM.  She will  continue to work on diet and activity. Age appropriate physical exam performed and is unremarkable No labs are indicated at today's visit. She will return in one year for an annual exam.          Outpatient Encounter Medications as of 09/20/2020  Medication Sig  . citalopram (CELEXA) 10 MG tablet Take 1 tablet (10 mg total) by mouth daily for 7 days, THEN 2 tablets (20 mg total) daily for 21 days.  . [DISCONTINUED] citalopram (CELEXA) 10 MG tablet Take 1 tablet (10 mg total) by mouth daily for 7 days, THEN 2 tablets (20 mg total) daily for 21 days.  . [DISCONTINUED] fluticasone (FLONASE) 50 MCG/ACT nasal spray Place 1 spray into both nostrils daily for 14 days.  . [DISCONTINUED] ibuprofen (ADVIL) 800 MG tablet Take 1 tablet (800 mg total) by mouth 3 (three) times daily.   No facility-administered encounter medications on file as of 09/20/2020.    Follow-up: Return in about 4 weeks (around 10/18/2020) for telehealth for anxiety follow up.   Pt discussed with Dr. Johny Shock, MD Internal Medicine Resident PGY-2 [facility] Pager: [phone] 09/20/2020 12:41 PM

## 2020-09-20 NOTE — Assessment & Plan Note (Signed)
This encounter was for an exam prior to beginning school next week. She is up to date on age appropriate screening and vaccinations. She has received the Covid 19 vaccine series and will send [place] a message with the dates she received these. Last pap smear was in 2020 and reported to be NILM.  She will continue to work on diet and activity. Age appropriate physical exam performed and is unremarkable No labs are indicated at today's visit. She will return in one year for an annual exam.

## 2020-09-20 NOTE — Assessment & Plan Note (Signed)
She notes having discontinued the celexa several months ago. She does continue to struggle with generalized anxiety symptoms and does feel like these were better controlled on celexa. She is agreeable to restarting this today. -celexa refilled and sent to her pharmacy. Discussed need to start at the lower dose and titrate up again with restarting -she will f/u with [place] via telehealth in 4-6w to let us know how her symptoms are doing

## 2020-09-20 NOTE — Patient Instructions (Signed)
It was a pleasure seeing you again today. When restarting the Celexa, please start at 10mg  for one week and then increase to 20mg  after that. Please arrange a telehealth visit in 4-6 weeks so we can check in and see how you are doing.   In regards to immunization records, you will likely need to try to get those records from your pediatrician. I have also attached a record of the ones we have in our system.

## 2020-09-20 NOTE — Assessment & Plan Note (Addendum)
Axillary ultrasound from 03/2020 recommended a repeat ultrasound in 3 months so I have ordered this today.

## 2020-09-21 NOTE — Progress Notes (Signed)
Internal Medicine Clinic Attending  Case discussed with Dr. Christian  At the time of the visit.  We reviewed the resident's history and exam and pertinent patient test results.  I agree with the assessment, diagnosis, and plan of care documented in the resident's note.  

## 2020-09-28 ENCOUNTER — Encounter: Payer: Self-pay | Admitting: Internal Medicine

## 2020-09-28 ENCOUNTER — Telehealth: Payer: Self-pay

## 2020-09-28 NOTE — Telephone Encounter (Signed)
Thanks! I just saw her 3/22 so I can fill it out rather than her having to have a separate visit for it. Just send me a message when she brings it in.

## 2020-09-28 NOTE — Telephone Encounter (Signed)
Return pt's call - stated she has a physical form which needs to be completed and asking for her immunization hx. She was seen 09/20/20 by Dr Ephriam Knuckles. Informed pt to drop off the form and front office will call her when ready. And she can pick up copy of immunization record.

## 2020-09-28 NOTE — Telephone Encounter (Signed)
Requesting to speak with a nurse about getting immunization records. Please call pt back.

## 2020-11-23 ENCOUNTER — Inpatient Hospital Stay: Admission: RE | Admit: 2020-11-23 | Payer: Medicaid Other | Source: Ambulatory Visit

## 2021-01-03 ENCOUNTER — Encounter: Payer: Self-pay | Admitting: *Deleted

## 2021-12-05 IMAGING — US US BREAST*R* LIMITED INC AXILLA
1 series · 7 of 7 positions shown · non-contrast
Comparison: None.

CLINICAL DATA: 24-year-old female with palpable lump in the RIGHT
axilla discovered 1 month ago at same time of a RIGHT thumb abscess.

EXAM:
ULTRASOUND OF THE RIGHT BREAST

[Series 1: us breast*right* limited inc axilla · 0.06mm/px · 7 of 7 slices shown]
[im 1/7]
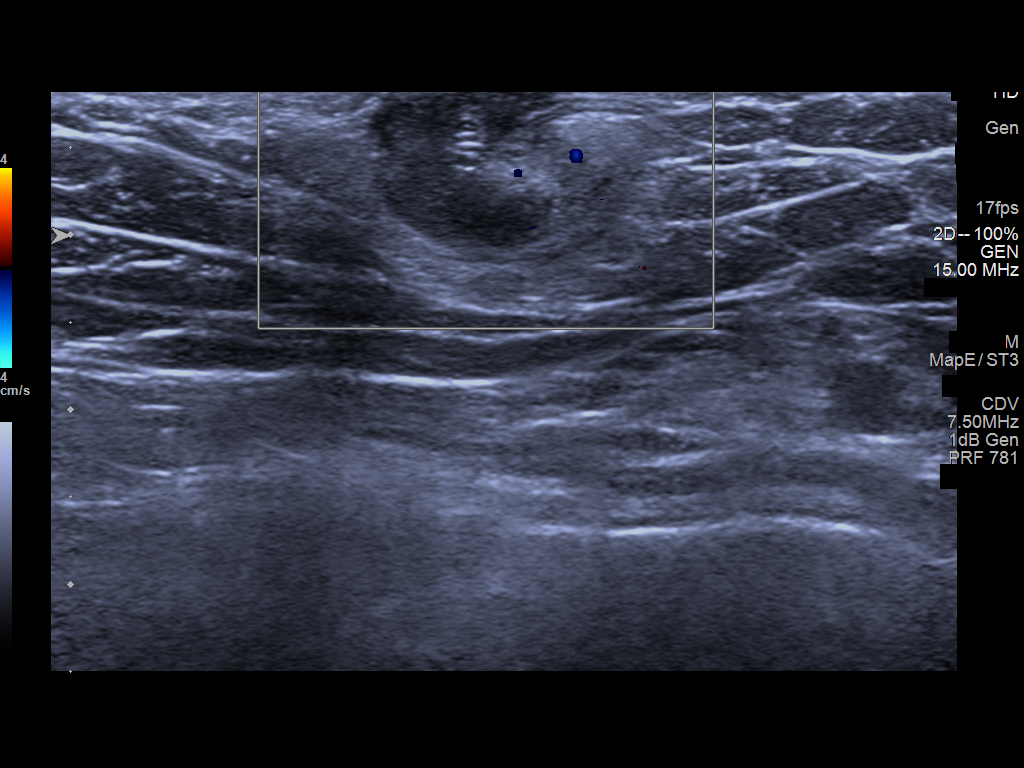
[im 2/7]
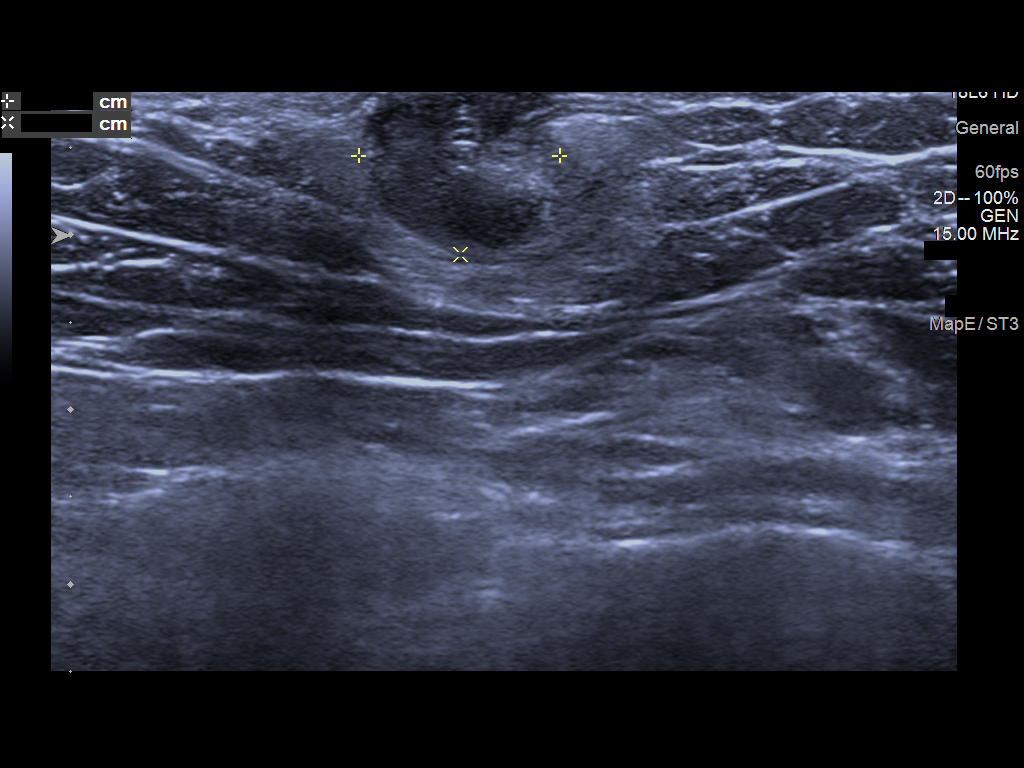
[im 3/7]
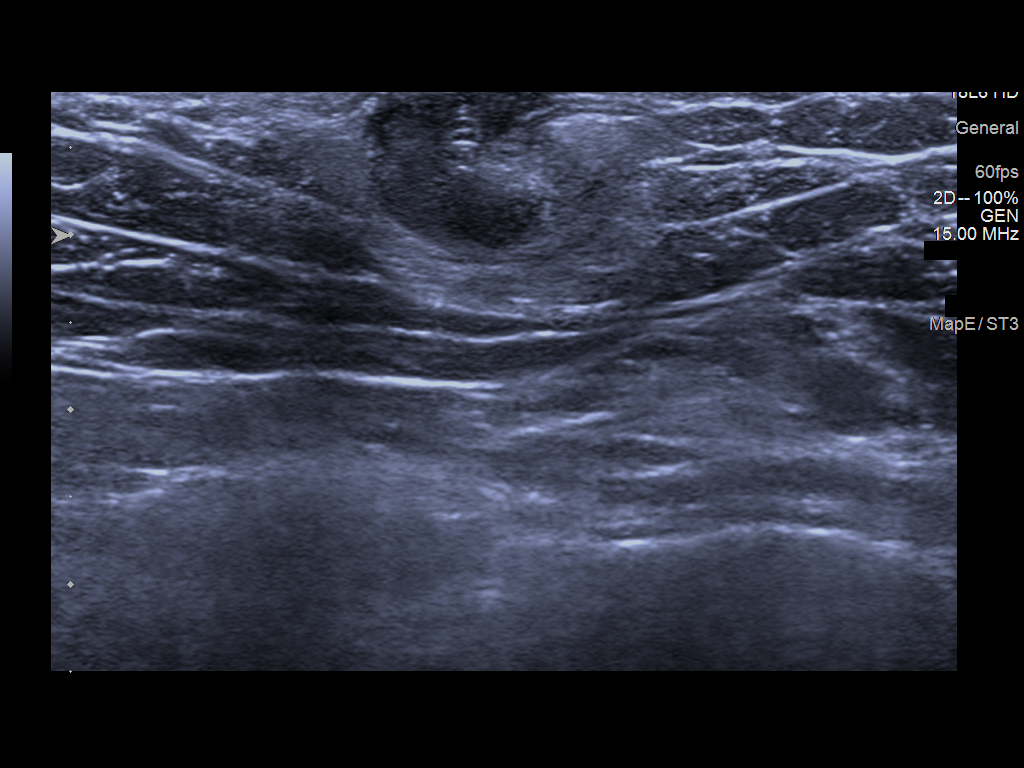
[im 4/7]
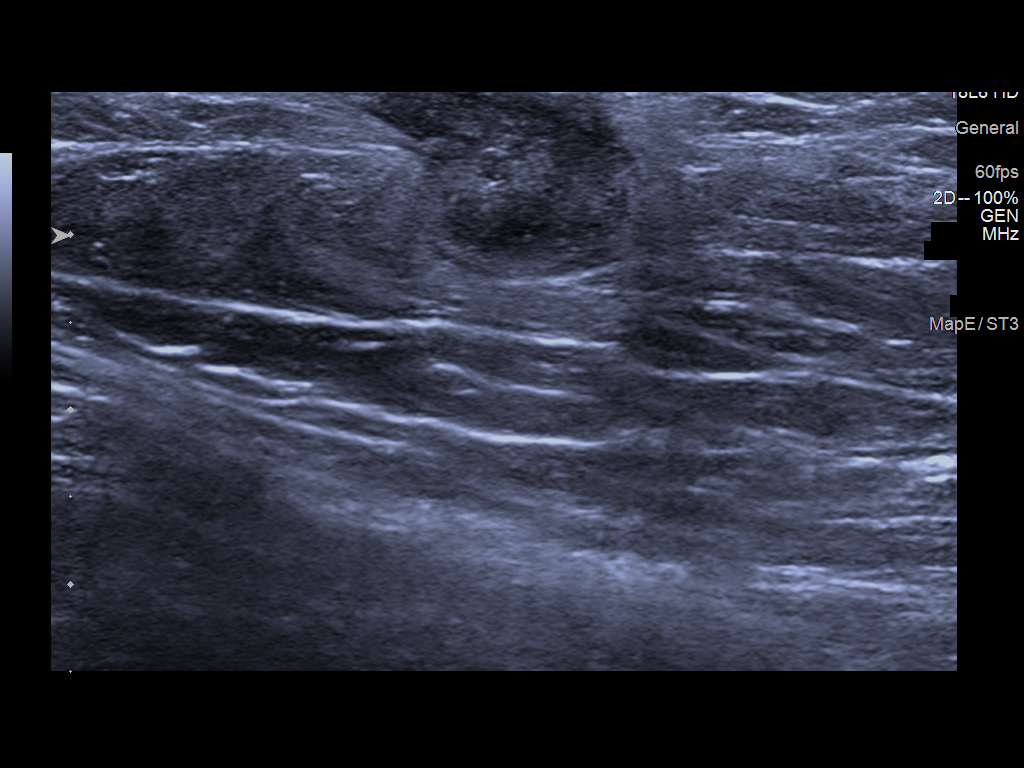
[im 5/7]
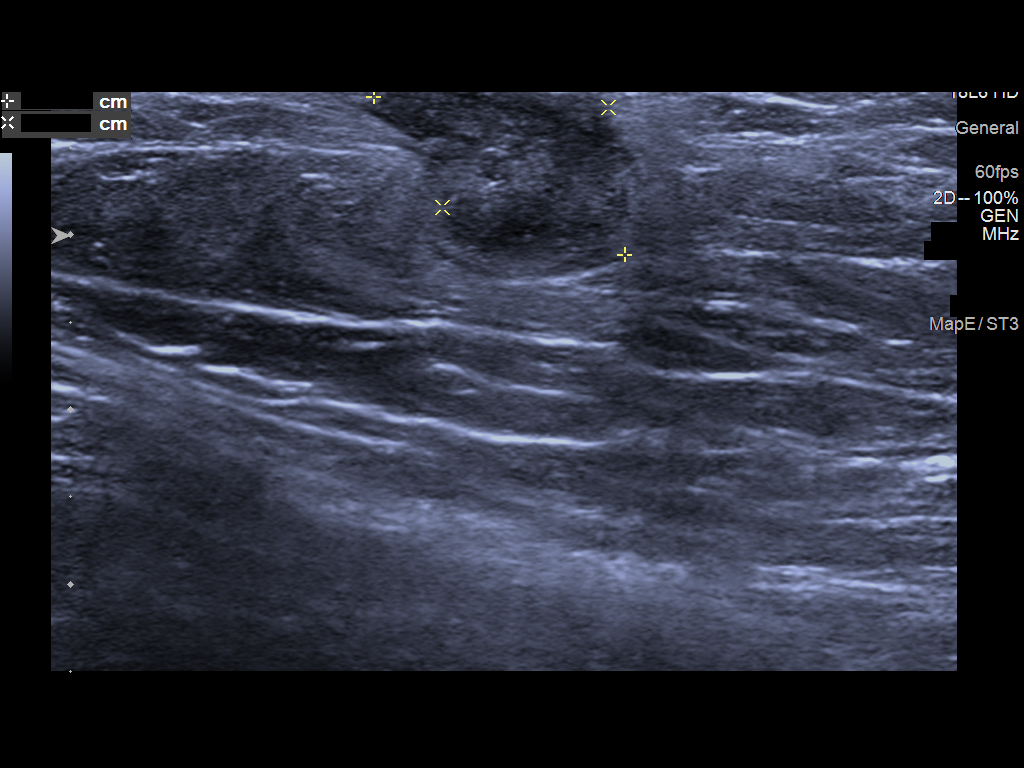
[im 6/7]
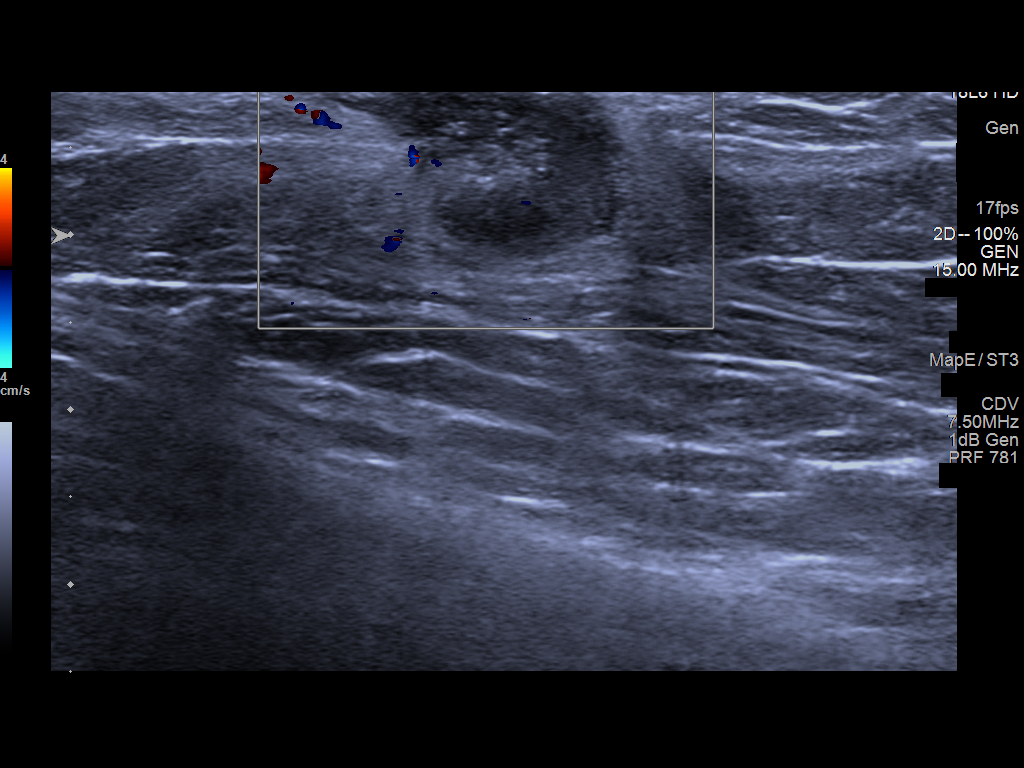
[im 7/7]
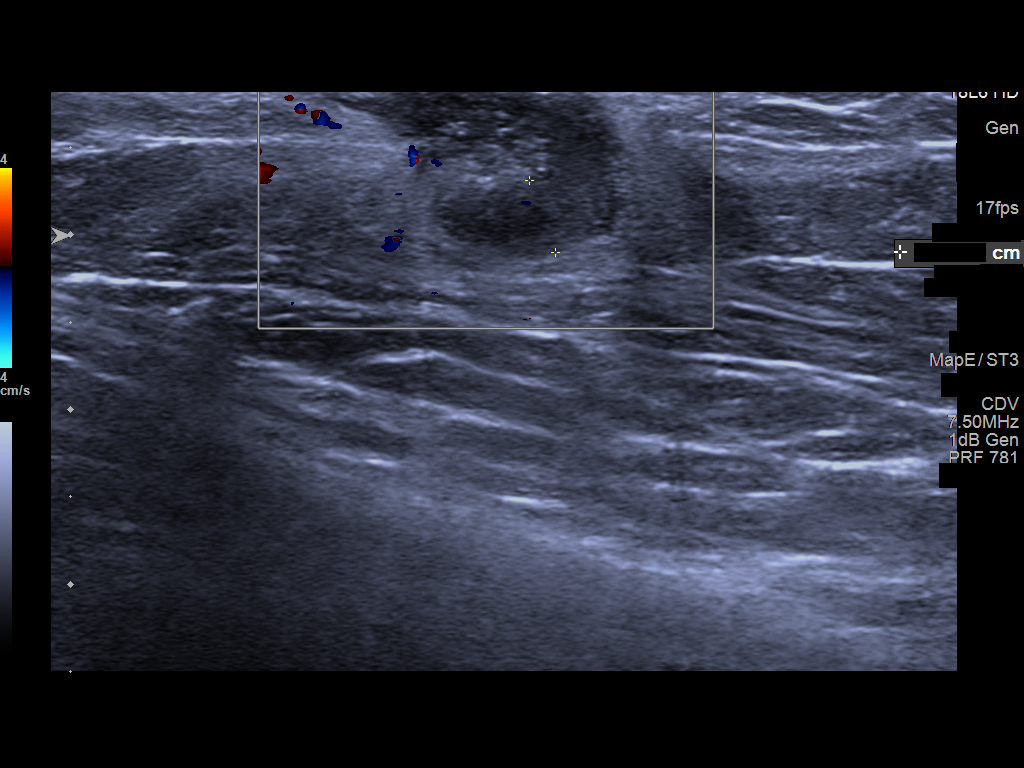

[7 of 7 positions shown; findings below may reference images not displayed]

FINDINGS: On physical exam, a small palpable mass is identified in the RIGHT
axilla.

Targeted ultrasound is performed, showing a prominent superficial
RIGHT axillary lymph node with cortical thickening of 4 mm,
corresponding to the patient's palpable abnormality.

No sonographic abnormalities are identified within the UPPER-OUTER
RIGHT breast.
IMPRESSION: RIGHT axillary lymph node with mild cortical thickening
corresponding to the patient's palpable abnormality. This is likely
reactive given patient's recent RIGHT thumb abscess but 3 month
follow-up is recommended to ensure improvement.

RECOMMENDATION:
RIGHT axillary ultrasound in 3 months.

I have discussed the findings and recommendations with the patient.
If applicable, a reminder letter will be sent to the patient
regarding the next appointment.

BI-RADS CATEGORY  3: Probably benign.

## 2021-12-11 ENCOUNTER — Other Ambulatory Visit: Payer: Self-pay | Admitting: Obstetrics & Gynecology

## 2021-12-11 DIAGNOSIS — O3680X0 Pregnancy with inconclusive fetal viability, not applicable or unspecified: Secondary | ICD-10-CM

## 2021-12-13 ENCOUNTER — Ambulatory Visit (INDEPENDENT_AMBULATORY_CARE_PROVIDER_SITE_OTHER): Payer: Medicaid Other

## 2021-12-13 DIAGNOSIS — O3680X0 Pregnancy with inconclusive fetal viability, not applicable or unspecified: Secondary | ICD-10-CM | POA: Diagnosis not present

## 2021-12-13 NOTE — Progress Notes (Signed)
[place] 8+2 wks,single IUP with yolk sac,CRL 20.21 mm,normal ovaries,FHR 169 bpm

## 2021-12-25 ENCOUNTER — Encounter: Payer: Self-pay | Admitting: *Deleted

## 2022-01-03 DIAGNOSIS — Z3490 Encounter for supervision of normal pregnancy, unspecified, unspecified trimester: Secondary | ICD-10-CM | POA: Insufficient documentation

## 2022-01-12 ENCOUNTER — Other Ambulatory Visit: Payer: Self-pay | Admitting: Obstetrics & Gynecology

## 2022-01-12 DIAGNOSIS — Z3682 Encounter for antenatal screening for nuchal translucency: Secondary | ICD-10-CM

## 2022-01-15 ENCOUNTER — Ambulatory Visit (INDEPENDENT_AMBULATORY_CARE_PROVIDER_SITE_OTHER): Payer: Medicaid Other

## 2022-01-15 ENCOUNTER — Other Ambulatory Visit: Payer: Medicaid Other

## 2022-01-15 DIAGNOSIS — Z3480 Encounter for supervision of other normal pregnancy, unspecified trimester: Secondary | ICD-10-CM

## 2022-01-15 DIAGNOSIS — Z1379 Encounter for other screening for genetic and chromosomal anomalies: Secondary | ICD-10-CM

## 2022-01-15 DIAGNOSIS — Z3682 Encounter for antenatal screening for nuchal translucency: Secondary | ICD-10-CM

## 2022-01-15 DIAGNOSIS — Z3A13 13 weeks gestation of pregnancy: Secondary | ICD-10-CM

## 2022-01-15 NOTE — Progress Notes (Signed)
[place] 13 wks,measurements c/w dates,CRL 73.72 mm,NB present,NT 1.6 mm,FHR 159 bpm,normal ovaries,anterior placenta

## 2022-01-16 ENCOUNTER — Ambulatory Visit: Payer: Medicaid Other

## 2022-01-16 ENCOUNTER — Ambulatory Visit (INDEPENDENT_AMBULATORY_CARE_PROVIDER_SITE_OTHER): Payer: Medicaid Other | Admitting: Medical

## 2022-01-16 ENCOUNTER — Ambulatory Visit: Payer: Medicaid Other | Admitting: *Deleted

## 2022-01-16 ENCOUNTER — Encounter: Payer: Self-pay | Admitting: Medical

## 2022-01-16 ENCOUNTER — Other Ambulatory Visit (HOSPITAL_COMMUNITY)
Admission: RE | Admit: 2022-01-16 | Discharge: 2022-01-16 | Disposition: A | Discharge status: Home / Self Care | Payer: Medicaid Other | Source: Ambulatory Visit | Attending: Medical | Admitting: Medical

## 2022-01-16 VITALS — BP 115/78 | HR 69 | Wt 211.0 lb

## 2022-01-16 DIAGNOSIS — Z3A13 13 weeks gestation of pregnancy: Secondary | ICD-10-CM

## 2022-01-16 DIAGNOSIS — Z2839 Other underimmunization status: Secondary | ICD-10-CM

## 2022-01-16 DIAGNOSIS — F411 Generalized anxiety disorder: Secondary | ICD-10-CM

## 2022-01-16 DIAGNOSIS — Z3480 Encounter for supervision of other normal pregnancy, unspecified trimester: Secondary | ICD-10-CM

## 2022-01-16 DIAGNOSIS — Z31430 Encounter of female for testing for genetic disease carrier status for procreative management: Secondary | ICD-10-CM

## 2022-01-16 DIAGNOSIS — O09899 Supervision of other high risk pregnancies, unspecified trimester: Secondary | ICD-10-CM

## 2022-01-16 DIAGNOSIS — O2341 Unspecified infection of urinary tract in pregnancy, first trimester: Secondary | ICD-10-CM

## 2022-01-16 DIAGNOSIS — Z3482 Encounter for supervision of other normal pregnancy, second trimester: Secondary | ICD-10-CM

## 2022-01-16 DIAGNOSIS — O99119 Other diseases of the blood and blood-forming organs and certain disorders involving the immune mechanism complicating pregnancy, unspecified trimester: Secondary | ICD-10-CM

## 2022-01-16 DIAGNOSIS — D696 Thrombocytopenia, unspecified: Secondary | ICD-10-CM

## 2022-01-16 LAB — OB RESULTS CONSOLE GBS: GBS: POSITIVE

## 2022-01-16 LAB — CBC/D/PLT+RPR+RH+ABO+RUBIGG...
Hepatitis B Surface Ag: NEGATIVE
RBC: 4.25 x10E6/uL (ref 3.77–5.28)

## 2022-01-16 NOTE — Patient Instructions (Signed)

## 2022-01-16 NOTE — Progress Notes (Signed)
PRENATAL VISIT NOTE  Subjective:  Marissa Friedman is a 26 y.o. [id number] at 63w1dbeing seen today for her first prenatal visit for this pregnancy.  She is currently monitored for the following issues for this low-risk pregnancy and has Rubella non-immune status, antepartum; Generalized anxiety disorder; Axillary adenopathy; Encounter for supervision of normal pregnancy, antepartum; and Thrombocytopenia affecting pregnancy, antepartum ([place] on their problem list.  Patient reports no complaints.  Contractions: Not present. Vag. Bleeding: None.  Movement: Absent. Denies leaking of fluid.   She is planning to bottle feed. Desires partner vasectomy for contraception.   The following portions of the patient's history were reviewed and updated as appropriate: allergies, current medications, past family history, past medical history, past social history, past surgical history and problem list.   Objective:   Vitals:   01/16/22 1000  BP: 115/78  Pulse: 69  Weight: 211 lb (95.7 kg)    Fetal Status: Fetal Heart Rate (bpm): 153   Movement: Absent     General:  Alert, oriented and cooperative. Patient is in no acute distress.  Skin: Skin is warm and dry. No rash noted.   Cardiovascular: Normal heart rate noted  Respiratory: Normal respiratory effort, no problems with respiration noted.   Abdomen: Soft, gravid, appropriate for gestational age. Non-tender. Pain/Pressure: Absent     Pelvic: Cervical exam performed Dilation: Closed     Normal cervical contour, no lesions, scant bleeding following pap, normal discharge  Extremities: Normal range of motion.  Edema: None  Mental Status: Normal mood and affect. Normal behavior. Normal judgment and thought content.    Indications for ASA therapy (per uptodate) One of the following: Previous pregnancy with preeclampsia, especially early onset and with an adverse outcome No Multifetal gestation No Chronic hypertension No Type 1 or 2 diabetes  mellitus No Chronic kidney disease No Autoimmune disease (antiphospholipid syndrome, systemic lupus erythematosus) No  Two or more of the following: Nulliparity No Obesity (body mass index >30 kg/m2) Yes Family history of preeclampsia in mother or sister No Age ?35 years No Sociodemographic characteristics (African American race, low socioeconomic level) No Personal risk factors (eg, previous pregnancy with low birth weight or small for gestational age infant, previous adverse pregnancy outcome [eg, stillbirth], interval >10 years between pregnancies) No  Indications for early GDM screening  First-degree relative with diabetes No BMI >30kg/m2 Yes Age > 35 No Previous birth of an infant weighing ?4000 g No Gestational diabetes mellitus in a previous pregnancy No Glycated hemoglobin ?5.7 percent (39 mmol/mol), impaired glucose tolerance, or impaired fasting glucose on previous testing No High-risk race/ethnicity (eg, African American, Latino, Native American, [place], Pacific Islander) No Previous stillbirth of unknown cause No Maternal birthweight > 9 lbs No History of cardiovascular disease No Hypertension or on therapy for hypertension No High-density lipoprotein cholesterol level <35 mg/dL (0.90 mmol/L) and/or a triglyceride level >250 mg/dL (2.82 mmol/L) No Polycystic ovary syndrome No Physical inactivity No Other clinical condition associated with insulin resistance (eg, severe obesity, acanthosis nigricans) No Current use of glucocorticoids No   Assessment and Plan:  Pregnancy: G5P4004 at 161w1d. Supervision of other normal pregnancy, antepartum - Cytology - PAP - Urine Culture - Reviewed labs from yesterday with patient  - Will schedule anatomy US[place] 19 weeks  - Discussed documented weight loss, patient states appetite has returned and N/V has resolved.   2. Generalized anxiety disorder - No treatment since teenager and improvement of symptoms since onset,  declines IBHC  3.  Rubella non-immune status, antepartum - Discussed PP MMR   4. Thrombocytopenia affecting pregnancy, antepartum ([place]) - Will continue to monitor, patient is aware   5. [redacted] weeks gestation of pregnancy  Preterm labor/first trimester warning symptoms and general obstetric precautions including but not limited to vaginal bleeding, contractions, leaking of fluid and fetal movement were reviewed in detail with the patient. Please refer to After Visit Summary for other counseling recommendations.   Discussed the normal visit cadence for prenatal care Discussed the nature of our practice with multiple providers including residents and students   Return in about 4 weeks (around 02/13/2022) for [facility] APP, In-Person, any provider.  No future appointments.  Kerry Hough, PA-C

## 2022-01-16 NOTE — Addendum Note (Signed)
Addended by: Harrel Lemon on: 01/16/2022 12:27 PM   Modules accepted: Orders

## 2022-01-16 NOTE — Progress Notes (Signed)
New OB OB Panel 01/15/22 OB Urine, Pap w/ GC/CC today

## 2022-01-17 LAB — CBC/D/PLT+RPR+RH+ABO+RUBIGG...
Antibody Screen: NEGATIVE
Basophils Absolute: 0 10*3/uL (ref 0.0–0.2)
Basos: 0 %
EOS (ABSOLUTE): 0.1 10*3/uL (ref 0.0–0.4)
Eos: 2 %
HCV Ab: NONREACTIVE
HIV Screen 4th Generation wRfx: NONREACTIVE
Hematocrit: 38.1 % (ref 34.0–46.6)
Hemoglobin: 13.4 g/dL (ref 11.1–15.9)
Immature Grans (Abs): 0 10*3/uL (ref 0.0–0.1)
Immature Granulocytes: 0 %
Lymphocytes Absolute: 1.2 10*3/uL (ref 0.7–3.1)
Lymphs: 23 %
MCH: 31.5 pg (ref 26.6–33.0)
MCHC: 35.2 g/dL (ref 31.5–35.7)
MCV: 90 fL (ref 79–97)
Monocytes Absolute: 0.3 10*3/uL (ref 0.1–0.9)
Monocytes: 5 %
Neutrophils Absolute: 3.6 10*3/uL (ref 1.4–7.0)
Neutrophils: 70 %
Platelets: 144 10*3/uL — ABNORMAL LOW (ref 150–450)
RDW: 13.7 % (ref 11.7–15.4)
RPR Ser Ql: NONREACTIVE
Rh Factor: POSITIVE
Rubella Antibodies, IGG: <0.9 index — ABNORMAL LOW (ref >0.99)
WBC: 5.2 10*3/uL (ref 3.4–10.8)

## 2022-01-17 LAB — CYTOLOGY - PAP
Chlamydia: NEGATIVE
Comment: NEGATIVE
Comment: NORMAL
Diagnosis: NEGATIVE
Neisseria Gonorrhea: NEGATIVE

## 2022-01-17 LAB — INTEGRATED 1
Crown Rump Length: 73.7 mm
Gest. Age on Collection Date: 13.3 weeks
Maternal Age at EDD: 29 yr
Nuchal Translucency (NT): 1.6 mm
Number of Fetuses: 1
PAPP-A Value: 1122.6 ng/mL
Weight: 210 [lb_av]

## 2022-01-17 LAB — HCV INTERPRETATION

## 2022-01-21 LAB — URINE CULTURE

## 2022-01-22 ENCOUNTER — Encounter: Payer: Self-pay | Admitting: Medical

## 2022-01-22 DIAGNOSIS — O2341 Unspecified infection of urinary tract in pregnancy, first trimester: Secondary | ICD-10-CM | POA: Insufficient documentation

## 2022-01-22 MED ORDER — NITROFURANTOIN MONOHYD MACRO 100 MG PO CAPS: 100.0000 mg | ORAL_CAPSULE | Freq: Two times a day (BID) | ORAL | 0 refills | Status: DC

## 2022-01-22 NOTE — Addendum Note (Signed)
Addended by: Marny Lowenstein on: 01/22/2022 11:37 AM   Modules accepted: Orders

## 2022-01-28 LAB — PANORAMA PRENATAL TEST FULL PANEL:PANORAMA TEST PLUS 5 ADDITIONAL MICRODELETIONS: FETAL FRACTION: 7.1

## 2022-01-28 LAB — HORIZON CUSTOM: REPORT SUMMARY: NEGATIVE

## 2022-02-13 ENCOUNTER — Encounter: Payer: Self-pay | Admitting: Women's Health

## 2022-02-13 ENCOUNTER — Ambulatory Visit (INDEPENDENT_AMBULATORY_CARE_PROVIDER_SITE_OTHER): Payer: Medicaid Other | Admitting: Women's Health

## 2022-02-13 VITALS — BP 117/71 | HR 70 | Wt 212.0 lb

## 2022-02-13 DIAGNOSIS — Z363 Encounter for antenatal screening for malformations: Secondary | ICD-10-CM

## 2022-02-13 DIAGNOSIS — Z3480 Encounter for supervision of other normal pregnancy, unspecified trimester: Secondary | ICD-10-CM

## 2022-02-13 DIAGNOSIS — Z1379 Encounter for other screening for genetic and chromosomal anomalies: Secondary | ICD-10-CM

## 2022-02-13 DIAGNOSIS — O2341 Unspecified infection of urinary tract in pregnancy, first trimester: Secondary | ICD-10-CM

## 2022-02-13 DIAGNOSIS — Z87440 Personal history of urinary (tract) infections: Secondary | ICD-10-CM

## 2022-02-13 DIAGNOSIS — Z3482 Encounter for supervision of other normal pregnancy, second trimester: Secondary | ICD-10-CM

## 2022-02-13 NOTE — Progress Notes (Signed)
LOW-RISK PREGNANCY VISIT Patient name: Marissa Friedman MRN [id number]  Date of birth: 03-02-1996 Chief Complaint:   Routine Prenatal Visit  History of Present Illness:   Marissa Friedman is a 26 y.o. G59P4004 female at [redacted]w[redacted]d with an Estimated Date of Delivery: 07/23/22 being seen today for ongoing management of a low-risk pregnancy.   Today she reports no complaints. Contractions: Not present. Vag. Bleeding: None.  Movement: Absent. denies leaking of fluid.     01/16/2022   10:13 AM 09/20/2020    8:50 AM 03/22/2020    3:42 PM 09/08/2018    2:03 PM 07/07/2018    2:23 PM  Depression screen PHQ 2/9  Decreased Interest 0 0 1 0 0  Down, Depressed, Hopeless 0 0 1 0 0  PHQ - 2 Score 0 0 2 0 0  Altered sleeping 0 0 0 0   Tired, decreased energy 0 0 2 0   Change in appetite 0 0 3 0   Feeling bad or failure about yourself  0 0 1 0   Trouble concentrating 0 0 2 0   Moving slowly or fidgety/restless 0 0 2 0   Suicidal thoughts 0 0 0 0   PHQ-9 Score 0 0 12 0   Difficult doing work/chores  Not difficult at all Not difficult at all          03/22/2020    3:43 PM  GAD 7 : Generalized Anxiety Score  Nervous, Anxious, on Edge 3  Control/stop worrying 3  Worry too much - different things 3  Trouble relaxing 3  Restless 2  Easily annoyed or irritable 2  Afraid - awful might happen 3  Total GAD 7 Score 19  Anxiety Difficulty Not difficult at all      Review of Systems:   Pertinent items are noted in HPI Denies abnormal vaginal discharge w/ itching/odor/irritation, headaches, visual changes, shortness of breath, chest pain, abdominal pain, severe nausea/vomiting, or problems with urination or bowel movements unless otherwise stated above. Pertinent History Reviewed:  Reviewed past medical,surgical, social, obstetrical and family history.  Reviewed problem list, medications and allergies. Physical Assessment:   Vitals:   02/13/22 0935  BP: 117/71  Pulse: 70  Weight: 212 lb (96.2  kg)  Body mass index is 36.39 kg/m.        Physical Examination:   General appearance: Well appearing, and in no distress  Mental status: Alert, oriented to person, place, and time  Skin: Warm & dry  Cardiovascular: Normal heart rate noted  Respiratory: Normal respiratory effort, no distress  Abdomen: Soft, gravid, nontender  Pelvic: Cervical exam deferred         Extremities: Edema: None  Fetal Status: Fetal Heart Rate (bpm): +u/s   Movement: Absent  , unable to hear well w/ doppler, informal TA u/s, +FCA and active fetus  Chaperone: N/A   No results found for this or any previous visit (from the past 24 hour(s)).  Assessment & Plan:  1) Low-risk pregnancy G5P4004 at [redacted]w[redacted]d with an Estimated Date of Delivery: 07/23/22   2) H/O UTI 1st trimester> urine cx poc today   Meds: No orders of the defined types were placed in this encounter.  Labs/procedures today: 2nd IT  Plan:  Continue routine obstetrical care  Next visit: prefers will be in person for y.s     Reviewed: Preterm labor symptoms and general obstetric precautions including but not limited to vaginal bleeding, contractions, leaking of fluid and fetal  movement were reviewed in detail with the patient.  All questions were answered. Does have home bp cuff. Office bp cuff given: not applicable. Check bp weekly, let us know if consistently >140 and/or >90.  Follow-up: Return for As scheduled.  Future Appointments  Date Time Provider Department Center  02/27/2022 10:00 AM [facility] [place] [facility] None  02/27/2022 11:10 AM Cheral Marker, CNM [facility]    Orders Placed This Encounter  Procedures   Urine Culture   US OB Comp + 14 Wk   INTEGRATED 2   Cheral Marker CNM, [facility] 02/13/2022 9:59 AM

## 2022-02-13 NOTE — Patient Instructions (Signed)
[place], thank you for choosing our office today! We appreciate the opportunity to meet your healthcare needs. You may receive a short survey by mail, e-mail, or through MyChart. If you are happy with your care we would appreciate if you could take just a few minutes to complete the survey questions. We read all of your comments and take your feedback very seriously. Thank you again for choosing our office.  [facility] Team at [facility] [place] ([address] [place], [place] [address]) Entrance C, located off of [address] Free 24/7 valet parking  Go to [email or web address] to register for FREE online childbirth classes  Call the office ([phone]) or go to [facility] if: You begin to severe cramping Your water breaks.  Sometimes it is a big gush of fluid, sometimes it is just a trickle that keeps getting your panties wet or running down your legs You have vaginal bleeding.  It is normal to have a small amount of spotting if your cervix was checked.   [place] Pediatricians/Family Doctors [facility] ([facility]): [address], [phone]           [facility]: [address], [phone]                [facility] ([facility]): [address], [phone] (call to ask if accepting patients) [facility]: [address], [place], [phone]    [place] Pediatricians/Family Doctors [facility] ([facility]): [address], [phone] [facility]: [address], [phone] [facility]: [address], [phone]  [place] [facility]  [facility] ([facility]): [phone] [facility]: [address], [phone]   [place] Family Doctors [facility]: [address], [phone]  [place] [facility]  [facility]: [address], [phone]  Home Blood Pressure Monitoring for Patients   Your provider has recommended that you check your blood pressure (BP) at least once a week at home. If you do not have a blood pressure cuff at home, one will be provided for you. Contact your provider if you have not received your monitor within 1 week.   Helpful Tips for Accurate Home Blood Pressure Checks  Don't smoke, exercise, or drink caffeine 30 minutes before checking your BP Use the restroom before checking your BP (a full bladder can raise your pressure) Relax in a comfortable upright chair Feet on the ground Left arm resting comfortably on a flat surface at the level of your heart Legs uncrossed Back supported Sit quietly and don't talk Place the cuff on your bare arm Adjust snuggly, so that only two fingertips can fit between your skin and the top of the cuff Check 2 readings separated by at least one minute Keep a log of your BP readings For a visual, please reference this diagram: http://ccnc.care/bpdiagram  Provider Name: [facility]     Phone: [phone]  Zone 1: ALL CLEAR  Continue to monitor your symptoms:  BP reading is less than 140 (top number) or less than 90 (bottom number)  No right upper stomach pain No headaches or seeing spots No feeling nauseated or throwing up No swelling in face and hands  Zone 2: CAUTION Call your doctor's office for any of the following:  BP reading is greater than 140 (top number) or greater than   90 (bottom number)  Stomach pain under your ribs in the middle or right side Headaches or seeing spots Feeling nauseated or throwing up Swelling in face and hands  Zone 3: EMERGENCY  Seek immediate medical care if you have any of the following:  BP reading is greater than160 (top number) or greater than 110 (bottom number) Severe headaches not improving with Tylenol Serious difficulty catching your breath Any worsening symptoms from  Zone 2     Second Trimester of Pregnancy The second trimester is from week 14 through week 27 (months 4 through 6). The second trimester is often a time when you feel your best. Your body has adjusted to being pregnant, and you begin to feel better physically. Usually, morning sickness has lessened or quit completely, you may have more energy, and you may have an increase in appetite. The second trimester is also a time when the fetus is growing rapidly. At the end of the sixth month, the fetus is about 9 inches long and weighs about 1 pounds. You will likely begin to feel the baby move (quickening) between 16 and 20 weeks of pregnancy. Body changes during your second trimester Your body continues to go through many changes during your second trimester. The changes vary from woman to woman. Your weight will continue to increase. You will notice your lower abdomen bulging out. You may begin to get stretch marks on your hips, abdomen, and breasts. You may develop headaches that can be relieved by medicines. The medicines should be approved by your health care provider. You may urinate more often because the fetus is pressing on your bladder. You may develop or continue to have heartburn as a result of your pregnancy. You may develop constipation because certain hormones are causing the muscles that push waste through your intestines to slow down. You may develop hemorrhoids or swollen, bulging veins (varicose veins). You may have back pain. This is caused by: Weight gain. Pregnancy hormones that are relaxing the joints in your pelvis. A shift in weight and the muscles that support your balance. Your breasts will continue to grow and they will continue to become tender. Your gums may bleed and may be sensitive to brushing and flossing. Dark spots or blotches (chloasma, mask of pregnancy) may develop on your face. This will likely fade after the baby is born. A dark line from your belly button to  the pubic area (linea nigra) may appear. This will likely fade after the baby is born. You may have changes in your hair. These can include thickening of your hair, rapid growth, and changes in texture. Some women also have hair loss during or after pregnancy, or hair that feels dry or thin. Your hair will most likely return to normal after your baby is born.  What to expect at prenatal visits During a routine prenatal visit: You will be weighed to make sure you and the fetus are growing normally. Your blood pressure will be taken. Your abdomen will be measured to track your baby's growth. The fetal heartbeat will be listened to. Any test results from the previous visit will be discussed.  Your health care provider may ask you: How you are feeling. If you are feeling the baby move. If you have had any abnormal symptoms, such as leaking fluid, bleeding, severe headaches, or abdominal cramping. If you are using any tobacco products, including cigarettes, chewing tobacco, and electronic cigarettes. If you have any questions.  Other tests that may be performed during   your second trimester include: Blood tests that check for: Low iron levels (anemia). High blood sugar that affects pregnant women (gestational diabetes) between 24 and 28 weeks. Rh antibodies. This is to check for a protein on red blood cells (Rh factor). Urine tests to check for infections, diabetes, or protein in the urine. An ultrasound to confirm the proper growth and development of the baby. An amniocentesis to check for possible genetic problems. Fetal screens for spina bifida and Down syndrome. HIV (human immunodeficiency virus) testing. Routine prenatal testing includes screening for HIV, unless you choose not to have this test.  Follow these instructions at home: Medicines Follow your health care provider's instructions regarding medicine use. Specific medicines may be either safe or unsafe to take during  pregnancy. Take a prenatal vitamin that contains at least 600 micrograms (mcg) of folic acid. If you develop constipation, try taking a stool softener if your health care provider approves. Eating and drinking Eat a balanced diet that includes fresh fruits and vegetables, whole grains, good sources of protein such as meat, eggs, or tofu, and low-fat dairy. Your health care provider will help you determine the amount of weight gain that is right for you. Avoid raw meat and uncooked cheese. These carry germs that can cause birth defects in the baby. If you have low calcium intake from food, talk to your health care provider about whether you should take a daily calcium supplement. Limit foods that are high in fat and processed sugars, such as fried and sweet foods. To prevent constipation: Drink enough fluid to keep your urine clear or pale yellow. Eat foods that are high in fiber, such as fresh fruits and vegetables, whole grains, and beans. Activity Exercise only as directed by your health care provider. Most women can continue their usual exercise routine during pregnancy. Try to exercise for 30 minutes at least 5 days a week. Stop exercising if you experience uterine contractions. Avoid heavy lifting, wear low heel shoes, and practice good posture. A sexual relationship may be continued unless your health care provider directs you otherwise. Relieving pain and discomfort Wear a good support bra to prevent discomfort from breast tenderness. Take warm sitz baths to soothe any pain or discomfort caused by hemorrhoids. Use hemorrhoid cream if your health care provider approves. Rest with your legs elevated if you have leg cramps or low back pain. If you develop varicose veins, wear support hose. Elevate your feet for 15 minutes, 3-4 times a day. Limit salt in your diet. Prenatal Care Write down your questions. Take them to your prenatal visits. Keep all your prenatal visits as told by your health  care provider. This is important. Safety Wear your seat belt at all times when driving. Make a list of emergency phone numbers, including numbers for family, friends, the hospital, and police and fire departments. General instructions Ask your health care provider for a referral to a local prenatal education class. Begin classes no later than the beginning of month 6 of your pregnancy. Ask for help if you have counseling or nutritional needs during pregnancy. Your health care provider can offer advice or refer you to specialists for help with various needs. Do not use hot tubs, steam rooms, or saunas. Do not douche or use tampons or scented sanitary pads. Do not cross your legs for long periods of time. Avoid cat litter boxes and soil used by cats. These carry germs that can cause birth defects in the baby and possibly loss of the   fetus by miscarriage or stillbirth. Avoid all smoking, herbs, alcohol, and unprescribed drugs. Chemicals in these products can affect the formation and growth of the baby. Do not use any products that contain nicotine or tobacco, such as cigarettes and e-cigarettes. If you need help quitting, ask your health care provider. Visit your dentist if you have not gone yet during your pregnancy. Use a soft toothbrush to brush your teeth and be gentle when you floss. Contact a health care provider if: You have dizziness. You have mild pelvic cramps, pelvic pressure, or nagging pain in the abdominal area. You have persistent nausea, vomiting, or diarrhea. You have a bad smelling vaginal discharge. You have pain when you urinate. Get help right away if: You have a fever. You are leaking fluid from your vagina. You have spotting or bleeding from your vagina. You have severe abdominal cramping or pain. You have rapid weight gain or weight loss. You have shortness of breath with chest pain. You notice sudden or extreme swelling of your face, hands, ankles, feet, or legs. You  have not felt your baby move in over an hour. You have severe headaches that do not go away when you take medicine. You have vision changes. Summary The second trimester is from week 14 through week 27 (months 4 through 6). It is also a time when the fetus is growing rapidly. Your body goes through many changes during pregnancy. The changes vary from woman to woman. Avoid all smoking, herbs, alcohol, and unprescribed drugs. These chemicals affect the formation and growth your baby. Do not use any tobacco products, such as cigarettes, chewing tobacco, and e-cigarettes. If you need help quitting, ask your health care provider. Contact your health care provider if you have any questions. Keep all prenatal visits as told by your health care provider. This is important. This information is not intended to replace advice given to you by your health care provider. Make sure you discuss any questions you have with your health care provider. Document Released: 06/12/2001 Document Revised: 11/24/2015 Document Reviewed: 08/19/2012 Elsevier Interactive Patient Education  2017 [employer]  

## 2022-02-15 LAB — INTEGRATED 2
AFP MoM: 0.76
Alpha-Fetoprotein: 23.2 ng/mL
Crown Rump Length: 73.7 mm
DIA MoM: 1.76
DIA Value: 218.2 pg/mL
Estriol, Unconjugated: 1.32 ng/mL
Gest. Age on Collection Date: 13.3 weeks
Gestational Age: 17.4 weeks
Maternal Age at EDD: 29 yr
Nuchal Translucency (NT): 1.6 mm
Nuchal Translucency MoM: 0.95
Number of Fetuses: 1
PAPP-A MoM: 1.26
PAPP-A Value: 1122.6 ng/mL
Test Results:: NEGATIVE
Weight: 210 [lb_av]
Weight: 210 [lb_av]
hCG MoM: 1.87
hCG Value: 42.1 IU/mL
uE3 MoM: 1.16

## 2022-02-15 LAB — URINE CULTURE

## 2022-02-27 ENCOUNTER — Other Ambulatory Visit: Payer: Medicaid Other

## 2022-02-27 ENCOUNTER — Encounter: Payer: Medicaid Other | Admitting: Women's Health

## 2022-03-22 ENCOUNTER — Ambulatory Visit (INDEPENDENT_AMBULATORY_CARE_PROVIDER_SITE_OTHER): Payer: Medicaid Other | Admitting: Advanced Practice Midwife

## 2022-03-22 ENCOUNTER — Ambulatory Visit (INDEPENDENT_AMBULATORY_CARE_PROVIDER_SITE_OTHER): Payer: Medicaid Other

## 2022-03-22 VITALS — BP 123/73 | HR 78 | Wt 211.0 lb

## 2022-03-22 DIAGNOSIS — Z3A22 22 weeks gestation of pregnancy: Secondary | ICD-10-CM

## 2022-03-22 DIAGNOSIS — Z3480 Encounter for supervision of other normal pregnancy, unspecified trimester: Secondary | ICD-10-CM

## 2022-03-22 DIAGNOSIS — Z363 Encounter for antenatal screening for malformations: Secondary | ICD-10-CM | POA: Diagnosis not present

## 2022-03-22 DIAGNOSIS — Z3482 Encounter for supervision of other normal pregnancy, second trimester: Secondary | ICD-10-CM | POA: Diagnosis not present

## 2022-03-22 DIAGNOSIS — O2341 Unspecified infection of urinary tract in pregnancy, first trimester: Secondary | ICD-10-CM

## 2022-03-22 NOTE — Progress Notes (Signed)
   LOW-RISK PREGNANCY VISIT Patient name: Marissa Friedman MRN [id number]  Date of birth: 1995/07/07 Chief Complaint:   Routine Prenatal Visit  History of Present Illness:   Marissa Friedman is a 26 y.o. G106P4004 female at [redacted]w[redacted]d with an Estimated Date of Delivery: 07/23/22 being seen today for ongoing management of a low-risk pregnancy.  Today she reports no complaints. Contractions: Not present. Vag. Bleeding: None.  Movement: Present. denies leaking of fluid. Review of Systems:   Pertinent items are noted in HPI Denies abnormal vaginal discharge w/ itching/odor/irritation, headaches, visual changes, shortness of breath, chest pain, abdominal pain, severe nausea/vomiting, or problems with urination or bowel movements unless otherwise stated above. Pertinent History Reviewed:  Reviewed past medical,surgical, social, obstetrical and family history.  Reviewed problem list, medications and allergies. Physical Assessment:   Vitals:   03/22/22 1458  BP: 123/73  Pulse: 78  Weight: 211 lb (95.7 kg)  Body mass index is 36.22 kg/m.        Physical Examination:   General appearance: Well appearing, and in no distress  Mental status: Alert, oriented to person, place, and time  Skin: Warm & dry  Cardiovascular: Normal heart rate noted  Respiratory: Normal respiratory effort, no distress  Abdomen: Soft, gravid, nontender  Pelvic: Cervical exam deferred         Extremities: Edema: None  Fetal Status:     Movement: Present   [place] 22+3 wks,breech,anterior placenta gr 0,normal ovaries,cx 4.3 cm,SVP of fluid 4.1 cm,FHR 138 bpm.,EFW 524 g 54%,anatomy complete,no obvious abnormalities  Chaperone: n/a    No results found for this or any previous visit (from the past 24 hour(s)).  Assessment & Plan:  1) Low-risk pregnancy G5P4004 at [redacted]w[redacted]d with an Estimated Date of Delivery: 07/23/22      Meds: No orders of the defined types were placed in this encounter.  Labs/procedures today: anatomy  scan  Plan:  Continue routine obstetrical care  Next visit: prefers in person    Reviewed: Preterm labor symptoms and general obstetric precautions including but not limited to vaginal bleeding, contractions, leaking of fluid and fetal movement were reviewed in detail with the patient.  All questions were answered. Has home bp cuff.. Check bp weekly, let us know if >140/90.   Follow-up: No follow-ups on file.  Future Appointments  Date Time Provider [place]  03/22/2022  3:10 PM Cresenzo-Dishmon, Joaquim Lai, CNM [facility]    No orders of the defined types were placed in this encounter.  Christin Fudge DNP, CNM 03/22/2022 3:02 PM

## 2022-03-22 NOTE — Progress Notes (Signed)
[place] 22+3 wks,breech,anterior placenta gr 0,normal ovaries,cx 4.3 cm,SVP of fluid 4.1 cm,FHR 138 bpm.,EFW 524 g 54%,anatomy complete,no obvious abnormalities

## 2022-03-22 NOTE — Patient Instructions (Addendum)

## 2022-03-23 ENCOUNTER — Ambulatory Visit
Admission: EM | Admit: 2022-03-23 | Discharge: 2022-03-23 | Disposition: A | Discharge status: Home / Self Care | Payer: Medicaid Other | Attending: Family Medicine | Admitting: Family Medicine

## 2022-03-23 DIAGNOSIS — K0889 Other specified disorders of teeth and supporting structures: Secondary | ICD-10-CM

## 2022-03-23 MED ORDER — CLINDAMYCIN HCL 300 MG PO CAPS: 300.0000 mg | ORAL_CAPSULE | Freq: Two times a day (BID) | ORAL | 0 refills | Status: DC

## 2022-03-23 NOTE — ED Provider Notes (Signed)
[facility]    CSN: [id number] Arrival date & time: 03/23/22  1653      History   Chief Complaint Chief Complaint  Patient presents with   gum pain    HPI Marissa Friedman is a 26 y.o. female.   Presenting today with 2-day history of gum swelling, dental pain left lower region where tooth had broken off in the past.  Denies drainage, bleeding, fever, chills.  Has been trying Tylenol with minimal relief.  Currently second trimester pregnancy.    Past Medical History:  Diagnosis Date   Anxiety     Patient Active Problem List   Diagnosis Date Noted   UTI in pregnancy, antepartum, first trimester 01/22/2022   Thrombocytopenia affecting pregnancy, antepartum (HCC) 01/16/2022   Encounter for supervision of normal pregnancy, antepartum 01/03/2022   Generalized anxiety disorder 03/22/2020   Axillary adenopathy 03/22/2020   Rubella non-immune status, antepartum 04/26/2017    Past Surgical History:  Procedure Laterality Date   NO PAST SURGERIES      OB History     Gravida  5   Para  4   Term  4   Preterm      AB      Living  4      SAB      IAB      Ectopic      Multiple  0   Live Births  4        Obstetric Comments  [facility]          Home Medications    Prior to Admission medications   Medication Sig Start Date End Date Taking? Authorizing Provider  clindamycin (CLEOCIN) 300 MG capsule Take 1 capsule (300 mg total) by mouth 2 (two) times daily. 03/23/22  Yes Particia Nearing, PA-C  citalopram (CELEXA) 10 MG tablet Take 1 tablet (10 mg total) by mouth daily for 7 days, THEN 2 tablets (20 mg total) daily for 21 days. 09/20/20 10/18/20  Elige Radon, MD  nitrofurantoin, macrocrystal-monohydrate, (MACROBID) 100 MG capsule Take 1 capsule (100 mg total) by mouth 2 (two) times daily. Patient not taking: Reported on 02/13/2022 01/22/22   Marny Lowenstein, PA-C  fluticasone [facility]) 50 MCG/ACT nasal spray Place  1 spray into both nostrils daily for 14 days. 01/17/20 03/09/20  Avegno, Zachery Dakins, FNP    Family History Family History  Problem Relation Age of Onset   Asthma Mother    Healthy Mother    Healthy Father    Heart disease Maternal Grandfather    Hypertension Paternal Grandmother    Stroke Other    Coronary artery disease Other     Social History Social History   Tobacco Use   Smoking status: Former    Packs/day: 0.00    Years: 5.00    Total pack years: 0.00    Types: Cigarettes   Smokeless tobacco: Never  Vaping Use   Vaping Use: Never used  Substance Use Topics   Alcohol use: Not Currently   Drug use: No     Allergies   Penicillins and Wellbutrin [bupropion]   Review of Systems Review of Systems Per HPI  Physical Exam Triage Vital Signs ED Triage Vitals  Enc Vitals Group     BP 03/23/22 1658 114/74     Pulse Rate 03/23/22 1658 91     Resp 03/23/22 1658 16     Temp 03/23/22 1658 98.1 F (36.7 C)  Temp Source 03/23/22 1658 Oral     SpO2 03/23/22 1658 98 %     Weight --      Height --      Head Circumference --      Peak Flow --      Pain Score 03/23/22 1659 8     Pain Loc --      Pain Edu? --      Excl. in [place]? --    No data found.  Updated Vital Signs BP 114/74 (BP Location: Right Arm)   Pulse 91   Temp 98.1 F (36.7 C) (Oral)   Resp 16   LMP 10/16/2021   SpO2 98%   Visual Acuity Right Eye Distance:   Left Eye Distance:   Bilateral Distance:    Right Eye Near:   Left Eye Near:    Bilateral Near:     Physical Exam Vitals and nursing note reviewed.  Constitutional:      Appearance: Normal appearance. She is not ill-appearing.  HENT:     Head: Atraumatic.     Mouth/Throat:     Mouth: Mucous membranes are moist.     Comments: Gingival edema, erythema and area of broken tooth left lower jaw Eyes:     Extraocular Movements: Extraocular movements intact.     Conjunctiva/sclera: Conjunctivae normal.  Cardiovascular:     Rate and  Rhythm: Normal rate and regular rhythm.     Heart sounds: Normal heart sounds.  Pulmonary:     Effort: Pulmonary effort is normal.     Breath sounds: Normal breath sounds.  Musculoskeletal:        General: Normal range of motion.     Cervical back: Normal range of motion and neck supple.  Lymphadenopathy:     Cervical: No cervical adenopathy.  Skin:    General: Skin is warm and dry.  Neurological:     Mental Status: She is alert and oriented to person, place, and time.  Psychiatric:        Mood and Affect: Mood normal.        Thought Content: Thought content normal.        Judgment: Judgment normal.      UC Treatments / Results  Labs (all labs ordered are listed, but only abnormal results are displayed) Labs Reviewed - No data to display  EKG   Radiology No results found.  Procedures Procedures (including critical care time)  Medications Ordered in UC Medications - No data to display  Initial Impression / Assessment and Plan / UC Course  I have reviewed the triage vital signs and the nursing notes.  Pertinent labs & imaging results that were available during my care of the patient were reviewed by me and considered in my medical decision making (see chart for details).     Treat with clindamycin, salt water rinses and Tylenol as needed.  Follow-up with dentist for recheck.  Final Clinical Impressions(s) / UC Diagnoses   Final diagnoses:  Pain, dental   Discharge Instructions   None    ED Prescriptions     Medication Sig Dispense Auth. Provider   clindamycin (CLEOCIN) 300 MG capsule Take 1 capsule (300 mg total) by mouth 2 (two) times daily. 14 capsule Volney American, [place]      PDMP not reviewed this encounter.   Volney American, [place] 03/23/22 1743

## 2022-03-23 NOTE — ED Triage Notes (Signed)
Pt reports on and off gum pain x 1 1/2 day . Pain is trigger when eating.   Pt reports 22 weeks pregnancy.

## 2022-04-19 ENCOUNTER — Ambulatory Visit (INDEPENDENT_AMBULATORY_CARE_PROVIDER_SITE_OTHER): Payer: Medicaid Other | Admitting: Advanced Practice Midwife

## 2022-04-19 ENCOUNTER — Other Ambulatory Visit: Payer: Medicaid Other

## 2022-04-19 ENCOUNTER — Encounter: Payer: Self-pay | Admitting: Advanced Practice Midwife

## 2022-04-19 VITALS — BP 98/65 | HR 98 | Wt 216.0 lb

## 2022-04-19 DIAGNOSIS — Z3480 Encounter for supervision of other normal pregnancy, unspecified trimester: Secondary | ICD-10-CM

## 2022-04-19 DIAGNOSIS — Z3A26 26 weeks gestation of pregnancy: Secondary | ICD-10-CM

## 2022-04-19 DIAGNOSIS — E669 Obesity, unspecified: Secondary | ICD-10-CM

## 2022-04-19 DIAGNOSIS — Z131 Encounter for screening for diabetes mellitus: Secondary | ICD-10-CM

## 2022-04-19 DIAGNOSIS — Z1332 Encounter for screening for maternal depression: Secondary | ICD-10-CM

## 2022-04-19 NOTE — Patient Instructions (Signed)
Claudia Desanctis, I greatly value your feedback.  If you receive a survey following your visit with [place] today, we appreciate you taking the time to fill it out.  Thanks, Nigel Berthold, CNM   [place]!!! It is now [place] at [facility] ([place], [place] [address]) Entrance located off of [address] parking   Go to [email or web address] to register for FREE online childbirth classes    Call the office [phone]) or go to [facility] if: You begin to have strong, frequent contractions Your water breaks.  Sometimes it is a big gush of fluid, sometimes it is just a trickle that keeps getting your panties wet or running down your legs You have vaginal bleeding.  It is normal to have a small amount of spotting if your cervix was checked.  You don't feel your baby moving like normal.  If you don't, get you something to eat and drink and lay down and focus on feeling your baby move.  You should feel at least 10 movements in 2 hours.  If you don't, you should call the office or go to [facility].    Tdap Vaccine It is recommended that you get the Tdap vaccine during the third trimester of EACH pregnancy to help protect your baby from getting pertussis (whooping cough) 27-36 weeks is the BEST time to do this so that you can pass the protection on to your baby. During pregnancy is better than after pregnancy, but if you are unable to get it during pregnancy it will be offered at the hospital.  You will be offered this vaccine in the office after 27 weeks. If you do not have health insurance, you can get this vaccine at the health department or your family doctor Everyone who will be around your baby should also be up-to-date on their vaccines. Adults (who are not pregnant) only need 1 dose of Tdap during adulthood.   Third Trimester of Pregnancy The third trimester is from week 29 through week 42, months 7 through 9. The third  trimester is a time when the fetus is growing rapidly. At the end of the ninth month, the fetus is about 20 inches in length and weighs 6-10 pounds.  BODY CHANGES Your body goes through many changes during pregnancy. The changes vary from woman to woman.  Your weight will continue to increase. You can expect to gain 25-35 pounds (11-16 kg) by the end of the pregnancy. You may begin to get stretch marks on your hips, abdomen, and breasts. You may urinate more often because the fetus is moving lower into your pelvis and pressing on your bladder. You may develop or continue to have heartburn as a result of your pregnancy. You may develop constipation because certain hormones are causing the muscles that push waste through your intestines to slow down. You may develop hemorrhoids or swollen, bulging veins (varicose veins). You may have pelvic pain because of the weight gain and pregnancy hormones relaxing your joints between the bones in your pelvis. Backaches may result from overexertion of the muscles supporting your posture. You may have changes in your hair. These can include thickening of your hair, rapid growth, and changes in texture. Some women also have hair loss during or after pregnancy, or hair that feels dry or thin. Your hair will most likely return to normal after your baby is born. Your breasts will continue to grow and be tender. A  yellow discharge may leak from your breasts called colostrum. Your belly button may stick out. You may feel short of breath because of your expanding uterus. You may notice the fetus "dropping," or moving lower in your abdomen. You may have a bloody mucus discharge. This usually occurs a few days to a week before labor begins. Your cervix becomes thin and soft (effaced) near your due date. WHAT TO EXPECT AT YOUR PRENATAL EXAMS  You will have prenatal exams every 2 weeks until week 36. Then, you will have weekly prenatal exams. During a routine prenatal  visit: You will be weighed to make sure you and the fetus are growing normally. Your blood pressure is taken. Your abdomen will be measured to track your baby's growth. The fetal heartbeat will be listened to. Any test results from the previous visit will be discussed. You may have a cervical check near your due date to see if you have effaced. At around 36 weeks, your caregiver will check your cervix. At the same time, your caregiver will also perform a test on the secretions of the vaginal tissue. This test is to determine if a type of bacteria, Group B streptococcus, is present. Your caregiver will explain this further. Your caregiver may ask you: What your birth plan is. How you are feeling. If you are feeling the baby move. If you have had any abnormal symptoms, such as leaking fluid, bleeding, severe headaches, or abdominal cramping. If you have any questions. Other tests or screenings that may be performed during your third trimester include: Blood tests that check for low iron levels (anemia). Fetal testing to check the health, activity level, and growth of the fetus. Testing is done if you have certain medical conditions or if there are problems during the pregnancy. FALSE LABOR You may feel small, irregular contractions that eventually go away. These are called Braxton Hicks contractions, or false labor. Contractions may last for hours, days, or even weeks before true labor sets in. If contractions come at regular intervals, intensify, or become painful, it is best to be seen by your caregiver.  SIGNS OF LABOR  Menstrual-like cramps. Contractions that are 5 minutes apart or less. Contractions that start on the top of the uterus and spread down to the lower abdomen and back. A sense of increased pelvic pressure or back pain. A watery or bloody mucus discharge that comes from the vagina. If you have any of these signs before the 37th week of pregnancy, call your caregiver right away.  You need to go to the hospital to get checked immediately. HOME CARE INSTRUCTIONS  Avoid all smoking, herbs, alcohol, and unprescribed drugs. These chemicals affect the formation and growth of the baby. Follow your caregiver's instructions regarding medicine use. There are medicines that are either safe or unsafe to take during pregnancy. Exercise only as directed by your caregiver. Experiencing uterine cramps is a good sign to stop exercising. Continue to eat regular, healthy meals. Wear a good support bra for breast tenderness. Do not use hot tubs, steam rooms, or saunas. Wear your seat belt at all times when driving. Avoid raw meat, uncooked cheese, cat litter boxes, and soil used by cats. These carry germs that can cause birth defects in the baby. Take your prenatal vitamins. Try taking a stool softener (if your caregiver approves) if you develop constipation. Eat more high-fiber foods, such as fresh vegetables or fruit and whole grains. Drink plenty of fluids to keep your urine clear or pale yellow.  Take warm sitz baths to soothe any pain or discomfort caused by hemorrhoids. Use hemorrhoid cream if your caregiver approves. If you develop varicose veins, wear support hose. Elevate your feet for 15 minutes, 3-4 times a day. Limit salt in your diet. Avoid heavy lifting, wear low heal shoes, and practice good posture. Rest a lot with your legs elevated if you have leg cramps or low back pain. Visit your dentist if you have not gone during your pregnancy. Use a soft toothbrush to brush your teeth and be gentle when you floss. A sexual relationship may be continued unless your caregiver directs you otherwise. Do not travel far distances unless it is absolutely necessary and only with the approval of your caregiver. Take prenatal classes to understand, practice, and ask questions about the labor and delivery. Make a trial run to the hospital. Pack your hospital bag. Prepare the baby's  nursery. Continue to go to all your prenatal visits as directed by your caregiver. SEEK MEDICAL CARE IF: You are unsure if you are in labor or if your water has broken. You have dizziness. You have mild pelvic cramps, pelvic pressure, or nagging pain in your abdominal area. You have persistent nausea, vomiting, or diarrhea. You have a bad smelling vaginal discharge. You have pain with urination. SEEK IMMEDIATE MEDICAL CARE IF:  You have a fever. You are leaking fluid from your vagina. You have spotting or bleeding from your vagina. You have severe abdominal cramping or pain. You have rapid weight loss or gain. You have shortness of breath with chest pain. You notice sudden or extreme swelling of your face, hands, ankles, feet, or legs. You have not felt your baby move in over an hour. You have severe headaches that do not go away with medicine. You have vision changes. Document Released: 06/12/2001 Document Revised: 06/23/2013 Document Reviewed: 08/19/2012 [facility] Patient Information 2015 [place], [place]. This information is not intended to replace advice given to you by your health care provider. Make sure you discuss any questions you have with your health care provider.

## 2022-04-19 NOTE — Progress Notes (Signed)
   LOW-RISK PREGNANCY VISIT Patient name: Marissa Friedman MRN [id number]  Date of birth: [birth year] Chief Complaint:   Routine Prenatal Visit (PN2)  History of Present Illness:   Marissa Friedman is a 26 y.o. G50P4004 female at [redacted]w[redacted]d with an Estimated Date of Delivery: 07/23/22 being seen today for ongoing management of a low-risk pregnancy.  Today she reports no complaints. Contractions: Not present.  .  Movement: Present. denies leaking of fluid. Review of Systems:   Pertinent items are noted in HPI Denies abnormal vaginal discharge w/ itching/odor/irritation, headaches, visual changes, shortness of breath, chest pain, abdominal pain, severe nausea/vomiting, or problems with urination or bowel movements unless otherwise stated above. Pertinent History Reviewed:  Reviewed past medical,surgical, social, obstetrical and family history.  Reviewed problem list, medications and allergies. Physical Assessment:   Vitals:   04/19/22 0949  BP: 98/65  Pulse: 98  Weight: 216 lb (98 kg)  Body mass index is 37.08 kg/m.        Physical Examination:   General appearance: Well appearing, and in no distress  Mental status: Alert, oriented to person, place, and time  Skin: Warm & dry  Cardiovascular: Normal heart rate noted  Respiratory: Normal respiratory effort, no distress  Abdomen: Soft, gravid, nontender  Pelvic: Cervical exam deferred         Extremities: Edema: None  Fetal Status: Fetal Heart Rate (bpm): 145 Fundal Height: 27 cm Movement: Present    Chaperone: n/a    No results found for this or any previous visit (from the past 24 hour(s)).  Assessment & Plan:  1) Low-risk pregnancy G5P4004 at [redacted]w[redacted]d with an Estimated Date of Delivery: 07/23/22   2) obestiy BMI 38:  EFW 32 weeks, , IOL 39-40 per MFM   Meds: No orders of the defined types were placed in this encounter.  Labs/procedures today: PN2  Plan:  Continue routine obstetrical care  Next visit: prefers in person     Reviewed: Preterm labor symptoms and general obstetric precautions including but not limited to vaginal bleeding, contractions, leaking of fluid and fetal movement were reviewed in detail with the patient.  All questions were answered. Has home bp cuff. Check bp weekly, let us know if >140/90.   Follow-up: Return in about 3 weeks (around 05/10/2022) for [place]; EFW/LROB  in 6 weeks.  No future appointments.   Orders Placed This Encounter  Procedures   US OB Follow Up   Christin Fudge DNP, CNM 04/19/2022 10:50 AM

## 2022-04-20 LAB — CBC
Hematocrit: 34.2 % (ref 34.0–46.6)
Hemoglobin: 11.6 g/dL (ref 11.1–15.9)
MCH: 31.4 pg (ref 26.6–33.0)
MCHC: 33.9 g/dL (ref 31.5–35.7)
MCV: 92 fL (ref 79–97)
Platelets: 124 x10E3/uL — ABNORMAL LOW (ref 150–450)
RBC: 3.7 x10E6/uL — ABNORMAL LOW (ref 3.77–5.28)
RDW: 13.5 % (ref 11.7–15.4)
WBC: 8.3 x10E3/uL (ref 3.4–10.8)

## 2022-04-20 LAB — HIV ANTIBODY (ROUTINE TESTING W REFLEX): HIV Screen 4th Generation wRfx: NONREACTIVE

## 2022-04-20 LAB — SYPHILIS: RPR W/REFLEX TO RPR TITER AND TREPONEMAL ANTIBODIES, TRADITIONAL SCREENING AND DIAGNOSIS ALGORITHM: RPR Ser Ql: NONREACTIVE

## 2022-04-20 LAB — GLUCOSE TOLERANCE, 2 HOURS W/ 1HR
Glucose, 1 hour: 110 mg/dL (ref 70–179)
Glucose, 2 hour: 111 mg/dL (ref 70–152)
Glucose, Fasting: 82 mg/dL (ref 70–91)

## 2022-04-20 LAB — ANTIBODY SCREEN: Antibody Screen: NEGATIVE

## 2022-05-04 ENCOUNTER — Encounter: Payer: Self-pay | Admitting: Women's Health

## 2022-05-10 ENCOUNTER — Ambulatory Visit (INDEPENDENT_AMBULATORY_CARE_PROVIDER_SITE_OTHER): Payer: Medicaid Other | Admitting: Obstetrics & Gynecology

## 2022-05-10 VITALS — BP 107/72 | HR 95 | Wt 219.0 lb

## 2022-05-10 DIAGNOSIS — Z3483 Encounter for supervision of other normal pregnancy, third trimester: Secondary | ICD-10-CM

## 2022-05-10 DIAGNOSIS — Z3480 Encounter for supervision of other normal pregnancy, unspecified trimester: Secondary | ICD-10-CM

## 2022-05-10 DIAGNOSIS — Z3A29 29 weeks gestation of pregnancy: Secondary | ICD-10-CM

## 2022-05-10 NOTE — Progress Notes (Signed)
Patient ID: Marissa Friedman, female   DOB: 1995/08/28, 28 y.o.   MRN: [id number]   LOW-RISK PREGNANCY VISIT Patient name: Marissa Friedman MRN [id number]  Date of birth: 18-Oct-1995 Chief Complaint:   Routine Prenatal Visit  History of Present Illness:   Marissa Friedman is a 26 y.o. G30P4004 female at [redacted]w[redacted]d with an Estimated Date of Delivery: 07/23/22 being seen today for ongoing management of a low-risk pregnancy.     04/19/2022    9:55 AM 01/16/2022   10:13 AM 09/20/2020    8:50 AM 03/22/2020    3:42 PM 09/08/2018    2:03 PM  Depression screen PHQ 2/9  Decreased Interest 0 0 0 1 0  Down, Depressed, Hopeless 0 0 0 1 0  PHQ - 2 Score 0 0 0 2 0  Altered sleeping 0 0 0 0 0  Tired, decreased energy 0 0 0 2 0  Change in appetite 0 0 0 3 0  Feeling bad or failure about yourself  0 0 0 1 0  Trouble concentrating 0 0 0 2 0  Moving slowly or fidgety/restless 0 0 0 2 0  Suicidal thoughts 0 0 0 0 0  PHQ-9 Score 0 0 0 12 0  Difficult doing work/chores   Not difficult at all Not difficult at all     Today she reports no complaints. Contractions: Not present. Vag. Bleeding: None.  Movement: Present. denies leaking of fluid. Review of Systems:   Pertinent items are noted in HPI Denies abnormal vaginal discharge w/ itching/odor/irritation, headaches, visual changes, shortness of breath, chest pain, abdominal pain, severe nausea/vomiting, or problems with urination or bowel movements unless otherwise stated above. Pertinent History Reviewed:  Reviewed past medical,surgical, social, obstetrical and family history.  Reviewed problem list, medications and allergies. Physical Assessment:   Vitals:   05/10/22 0907  BP: 107/72  Pulse: 95  Weight: 219 lb (99.3 kg)  Body mass index is 37.59 kg/m.        Physical Examination:   General appearance: Well appearing, and in no distress  Mental status: Alert, oriented to person, place, and time  Skin: Warm & dry  Cardiovascular: Normal heart rate  noted  Respiratory: Normal respiratory effort, no distress  Abdomen: Soft, gravid, nontender  Pelvic: Cervical exam deferred         Extremities: Edema: None  Fetal Status: Fetal Heart Rate (bpm): 140 Fundal Height: 29 cm Movement: Present Presentation: Undeterminable  Chaperone: n/a    No results found for this or any previous visit (from the past 24 hour(s)).  Assessment & Plan:  1) Low-risk pregnancy G5P4004 at [redacted]w[redacted]d with an Estimated Date of Delivery: 07/23/22   2) ,    Meds: No orders of the defined types were placed in this encounter.  Labs/procedures today:   Plan:  Continue routine obstetrical care  Next visit: prefers in person    Reviewed: Preterm labor symptoms and general obstetric precautions including but not limited to vaginal bleeding, contractions, leaking of fluid and fetal movement were reviewed in detail with the patient.  All questions were answered. Has home bp cuff. Rx faxed to . Check bp weekly, let us know if >140/90.   Follow-up: Return in about 3 weeks (around 05/31/2022) for LROB.  No orders of the defined types were placed in this encounter.   Lazaro Arms, MD 05/10/2022 9:30 AM

## 2022-05-31 ENCOUNTER — Ambulatory Visit (INDEPENDENT_AMBULATORY_CARE_PROVIDER_SITE_OTHER): Payer: Medicaid Other

## 2022-05-31 ENCOUNTER — Ambulatory Visit (INDEPENDENT_AMBULATORY_CARE_PROVIDER_SITE_OTHER): Payer: Medicaid Other | Admitting: Advanced Practice Midwife

## 2022-05-31 VITALS — BP 130/86 | HR 93 | Wt 226.0 lb

## 2022-05-31 DIAGNOSIS — Z3480 Encounter for supervision of other normal pregnancy, unspecified trimester: Secondary | ICD-10-CM

## 2022-05-31 DIAGNOSIS — E669 Obesity, unspecified: Secondary | ICD-10-CM

## 2022-05-31 DIAGNOSIS — Z3A32 32 weeks gestation of pregnancy: Secondary | ICD-10-CM | POA: Diagnosis not present

## 2022-05-31 DIAGNOSIS — O2341 Unspecified infection of urinary tract in pregnancy, first trimester: Secondary | ICD-10-CM

## 2022-05-31 NOTE — Progress Notes (Signed)
   LOW-RISK PREGNANCY VISIT Patient name: Marissa Friedman MRN [id number]  Date of birth: 05-10-1996 Chief Complaint:   Routine Prenatal Visit  History of Present Illness:   Marissa Friedman is a 26 y.o. G33P4004 female at [redacted]w[redacted]d with an Estimated Date of Delivery: 07/23/22 being seen today for ongoing management of a low-risk pregnancy.  Today she reports no complaints. Contractions: Not present. Vag. Bleeding: None.  Movement: Present. denies leaking of fluid. Review of Systems:   Pertinent items are noted in HPI Denies abnormal vaginal discharge w/ itching/odor/irritation, headaches, visual changes, shortness of breath, chest pain, abdominal pain, severe nausea/vomiting, or problems with urination or bowel movements unless otherwise stated above. Pertinent History Reviewed:  Reviewed past medical,surgical, social, obstetrical and family history.  Reviewed problem list, medications and allergies. Physical Assessment:   Vitals:   05/31/22 0952  BP: 130/86  Pulse: 93  Weight: 226 lb (102.5 kg)  Body mass index is 38.79 kg/m.        Physical Examination:   General appearance: Well appearing, and in no distress  Mental status: Alert, oriented to person, place, and time  Skin: Warm & dry  Cardiovascular: Normal heart rate noted  Respiratory: Normal respiratory effort, no distress  Abdomen: Soft, gravid, nontender  Pelvic: Cervical exam deferred         Extremities: Edema: Trace  Fetal Status:     Movement: Present    [place] 32+3 wks,cephalic,anterior placenta gr 3,AFI 16.8 cm,EFW 56% 2080 g,FHR 138 BPM   Chaperone:  N/A    No results found for this or any previous visit (from the past 24 hour(s)).  Assessment & Plan:  1) Low-risk pregnancy G5P4004 at [redacted]w[redacted]d with an Estimated Date of Delivery: 07/23/22   2) BMI >35:  , IOL 39-40 weeks per MFM   Meds: No orders of the defined types were placed in this encounter.  Labs/procedures today:  EFW  Plan:  Continue routine obstetrical  care  Next visit: prefers in person    Reviewed: Preterm labor symptoms and general obstetric precautions including but not limited to vaginal bleeding, contractions, leaking of fluid and fetal movement were reviewed in detail with the patient.  All questions were answered. Has home bp cuff.. Check bp weekly, let us know if >140/90.   Follow-up: Return in about 2 weeks (around 06/14/2022) for LROB.  Future Appointments  Date Time Provider Department Center  05/31/2022 10:10 AM Cresenzo-Dishmon, Scarlette Calico, CNM [facility]    No orders of the defined types were placed in this encounter.  Jacklyn Shell DNP, CNM 05/31/2022 10:07 AM

## 2022-05-31 NOTE — Patient Instructions (Signed)
Marissa Friedman, I greatly value your feedback.  If you receive a survey following your visit with us today, we appreciate you taking the time to fill it out.  Thanks, Fran Cresenzo-Dishmon, DNP, CNM  [facility] It is now [facility] ([address] [place], [place] [address]) Entrance located off of [address] Free 24/7 valet parking   Go to [email or web address] to register for FREE online childbirth classes    Call the office ([phone]) or go to [facility] if: You begin to have strong, frequent contractions Your water breaks.  Sometimes it is a big gush of fluid, sometimes it is just a trickle that keeps getting your panties wet or running down your legs You have vaginal bleeding.  It is normal to have a small amount of spotting if your cervix was checked.  You don't feel your baby moving like normal.  If you don't, get you something to eat and drink and lay down and focus on feeling your baby move.  You should feel at least 10 movements in 2 hours.  If you don't, you should call the office or go to [facility].   Home Blood Pressure Monitoring for Patients   Your provider has recommended that you check your blood pressure (BP) at least once a week at home. If you do not have a blood pressure cuff at home, one will be provided for you. Contact your provider if you have not received your monitor within 1 week.   Helpful Tips for Accurate Home Blood Pressure Checks  Don't smoke, exercise, or drink caffeine 30 minutes before checking your BP Use the restroom before checking your BP (a full bladder can raise your pressure) Relax in a comfortable upright chair Feet on the ground Left arm resting comfortably on a flat surface at the level of your heart Legs uncrossed Back supported Sit quietly and don't talk Place the cuff on your bare arm Adjust snuggly, so that only two fingertips can fit between your skin and the  top of the cuff Check 2 readings separated by at least one minute Keep a log of your BP readings For a visual, please reference this diagram: http://ccnc.care/bpdiagram  Provider Name: [facility]     Phone: [phone]  Zone 1: ALL CLEAR  Continue to monitor your symptoms:  BP reading is less than 140 (top number) or less than 90 (bottom number)  No right upper stomach pain No headaches or seeing spots No feeling nauseated or throwing up No swelling in face and hands  Zone 2: CAUTION Call your doctor's office for any of the following:  BP reading is greater than 140 (top number) or greater than 90 (bottom number)  Stomach pain under your ribs in the middle or right side Headaches or seeing spots Feeling nauseated or throwing up Swelling in face and hands  Zone 3: EMERGENCY  Seek immediate medical care if you have any of the following:  BP reading is greater than160 (top number) or greater than 110 (bottom number) Severe headaches not improving with Tylenol Serious difficulty catching your breath Any worsening symptoms from Zone 2   

## 2022-05-31 NOTE — Progress Notes (Signed)
[place] 32+3 wks,cephalic,anterior placenta gr 3,AFI 16.8 cm,EFW 56% 2080 g,FHR 138 BPM

## 2022-06-14 ENCOUNTER — Ambulatory Visit (INDEPENDENT_AMBULATORY_CARE_PROVIDER_SITE_OTHER): Payer: Medicaid Other | Admitting: Advanced Practice Midwife

## 2022-06-14 ENCOUNTER — Encounter: Payer: Self-pay | Admitting: Advanced Practice Midwife

## 2022-06-14 VITALS — BP 116/74 | HR 83 | Wt 223.5 lb

## 2022-06-14 DIAGNOSIS — Z3A34 34 weeks gestation of pregnancy: Secondary | ICD-10-CM

## 2022-06-14 DIAGNOSIS — Z3480 Encounter for supervision of other normal pregnancy, unspecified trimester: Secondary | ICD-10-CM

## 2022-06-14 NOTE — Progress Notes (Addendum)
   LOW-RISK PREGNANCY VISIT Patient name: Marissa Friedman MRN [id number]  Date of birth: [birth year] Chief Complaint:   Routine Prenatal Visit  History of Present Illness:   Marissa Friedman is a 26 y.o. G55P4004 female at [redacted]w[redacted]d with an Estimated Date of Delivery: 07/23/22 being seen today for ongoing management of a low-risk pregnancy.  Today she reports no complaints. Contractions: Not present. Vag. Bleeding: None.  Movement: Present. denies leaking of fluid. Review of Systems:   Pertinent items are noted in HPI Denies abnormal vaginal discharge w/ itching/odor/irritation, headaches, visual changes, shortness of breath, chest pain, abdominal pain, severe nausea/vomiting, or problems with urination or bowel movements unless otherwise stated above. Pertinent History Reviewed:  Reviewed past medical,surgical, social, obstetrical and family history.  Reviewed problem list, medications and allergies. Physical Assessment:   Vitals:   06/14/22 1050  BP: 116/74  Pulse: 83  Weight: 223 lb 8 oz (101.4 kg)  Body mass index is 38.36 kg/m.        Physical Examination:   General appearance: Well appearing, and in no distress  Mental status: Alert, oriented to person, place, and time  Skin: Warm & dry  Cardiovascular: Normal heart rate noted  Respiratory: Normal respiratory effort, no distress  Abdomen: Soft, gravid, nontender  Pelvic: Cervical exam deferred         Extremities:    Fetal Status: Fetal Heart Rate (bpm): 128 Fundal Height: 33 cm Movement: Present    Chaperone:  N/A    No results found for this or any previous visit (from the past 24 hour(s)).  Assessment & Plan:  1) Low-risk pregnancy G5P4004 at [redacted]w[redacted]d with an Estimated Date of Delivery: 07/23/22     Meds: No orders of the defined types were placed in this encounter.  Labs/procedures today: none  Plan:  Continue routine obstetrical care  Next visit: prefers in person    Reviewed: Preterm labor symptoms and general  obstetric precautions including but not limited to vaginal bleeding, contractions, leaking of fluid and fetal movement were reviewed in detail with the patient.  All questions were answered. Has home bp cuff.. Check bp weekly, let us know if >140/90.   Follow-up: Return for in 2 weeks, start weekly LROB X 4.  Future Appointments  Date Time Provider Department Center  06/28/2022  9:50 AM Jacklyn Shell, CNM [facility]  07/04/2022  8:50 AM Autry-Lott, Randa Evens, DO [facility]  07/12/2022  9:50 AM Jacklyn Shell, CNM [facility]  07/19/2022  8:30 AM Cresenzo-Dishmon, Scarlette Calico, CNM [facility]    No orders of the defined types were placed in this encounter.  Jacklyn Shell DNP, CNM 06/14/2022 11:41 AM

## 2022-06-14 NOTE — Patient Instructions (Signed)
Marissa Friedman, I greatly value your feedback.  If you receive a survey following your visit with [place] today, we appreciate you taking the time to fill it out.  Thanks, [name], DNP, CNM  [facility] HAS MOVED!!! It is now [facility] ([address] [place], [place] [address]) Entrance located off of [address] Free 24/7 valet parking   Go to [email or web address] to register for FREE online childbirth classes    Call the office [phone]) or go to [facility] if: You begin to have strong, frequent contractions Your water breaks.  Sometimes it is a big gush of fluid, sometimes it is just a trickle that keeps getting your panties wet or running down your legs You have vaginal bleeding.  It is normal to have a small amount of spotting if your cervix was checked.  You don't feel your baby moving like normal.  If you don't, get you something to eat and drink and lay down and focus on feeling your baby move.  You should feel at least 10 movements in 2 hours.  If you don't, you should call the office or go to [facility].   Home Blood Pressure Monitoring for Patients   Your provider has recommended that you check your blood pressure (BP) at least once a week at home. If you do not have a blood pressure cuff at home, one will be provided for you. Contact your provider if you have not received your monitor within 1 week.   Helpful Tips for Accurate Home Blood Pressure Checks  Don't smoke, exercise, or drink caffeine 30 minutes before checking your BP Use the restroom before checking your BP (a full bladder can raise your pressure) Relax in a comfortable upright chair Feet on the ground Left arm resting comfortably on a flat surface at the level of your heart Legs uncrossed Back supported Sit quietly and don't talk Place the cuff on your bare arm Adjust snuggly, so that only two fingertips can fit between your skin and the  top of the cuff Check 2 readings separated by at least one minute Keep a log of your BP readings For a visual, please reference this diagram: http://ccnc.care/bpdiagram  Provider Name: [facility]     Phone: [phone]  Zone 1: ALL CLEAR  Continue to monitor your symptoms:  BP reading is less than 140 (top number) or less than 90 (bottom number)  No right upper stomach pain No headaches or seeing spots No feeling nauseated or throwing up No swelling in face and hands  Zone 2: CAUTION Call your doctor's office for any of the following:  BP reading is greater than 140 (top number) or greater than 90 (bottom number)  Stomach pain under your ribs in the middle or right side Headaches or seeing spots Feeling nauseated or throwing up Swelling in face and hands  Zone 3: EMERGENCY  Seek immediate medical care if you have any of the following:  BP reading is greater than160 (top number) or greater than 110 (bottom number) Severe headaches not improving with Tylenol Serious difficulty catching your breath Any worsening symptoms from Zone 2

## 2022-06-19 ENCOUNTER — Encounter: Payer: Self-pay | Admitting: Advanced Practice Midwife

## 2022-06-21 ENCOUNTER — Ambulatory Visit
Admission: EM | Admit: 2022-06-21 | Discharge: 2022-06-21 | Disposition: A | Discharge status: Home / Self Care | Payer: Medicaid Other | Attending: Nurse Practitioner | Admitting: Nurse Practitioner

## 2022-06-21 ENCOUNTER — Encounter: Payer: Self-pay | Admitting: Emergency Medicine

## 2022-06-21 DIAGNOSIS — Z20828 Contact with and (suspected) exposure to other viral communicable diseases: Secondary | ICD-10-CM | POA: Diagnosis not present

## 2022-06-21 DIAGNOSIS — J069 Acute upper respiratory infection, unspecified: Secondary | ICD-10-CM

## 2022-06-21 DIAGNOSIS — R350 Frequency of micturition: Secondary | ICD-10-CM

## 2022-06-21 DIAGNOSIS — Z3A35 35 weeks gestation of pregnancy: Secondary | ICD-10-CM

## 2022-06-21 LAB — POCT URINALYSIS DIP (MANUAL ENTRY)
Blood, UA: NEGATIVE
Glucose, UA: NEGATIVE mg/dL
Leukocytes, UA: NEGATIVE
Nitrite, UA: NEGATIVE
Protein Ur, POC: 30 mg/dL — AB
Spec Grav, UA: 1.025 (ref 1.010–1.025)
Urobilinogen, UA: >=8 E.U./dL — AB
pH, UA: 6.5 (ref 5.0–8.0)

## 2022-06-21 MED ORDER — OSELTAMIVIR PHOSPHATE 75 MG PO CAPS: 75.0000 mg | ORAL_CAPSULE | Freq: Two times a day (BID) | ORAL | 0 refills | Status: AC

## 2022-06-21 NOTE — ED Triage Notes (Signed)
Exposed to flu A by children.  Cough with SOB and some chest pain and vomiting after coughing since Sunday. Has been taking Mylanta and robitussin DM   States she is not having a lot of urine out put.

## 2022-06-21 NOTE — Discharge Instructions (Signed)
I suspect you have influenza since your children have tested positive for it.  You can start the Tamiflu and take it to help shorten your symptoms.    I am concerned about your urine symptoms.  The UA today shows protein and bilirubin.  No signs of UTI today.  We are sending for a urine culture and will call you Monday if this shows we need to treat with antibiotics.  We have checked some blood work and will call you tomorrow if this is abnormal.  Please call your OB/GYN today and let them know what is going on, they may want to re-evaluate you in person.  If your symptoms worsen, go to the MAU.

## 2022-06-21 NOTE — ED Provider Notes (Signed)
[facility]    CSN: [id number] Arrival date & time: 06/21/22  1000      History   Chief Complaint No chief complaint on file.   HPI Marissa Friedman is a 26 y.o. female.   Patient presents with 4 to 5 days of body aches, chills, congested cough, pain in her chest after coughing, chest and nasal congestion, runny nose, headache, decreased appetite although she was pushing hydration with water and electrolyte solutions, loss of smell, and fatigue.  She denies shortness of breath, sore throat, ear pain, abdominal pain, nausea/vomiting, diarrhea.  Reports her children recently tested positive for influenza A.  Has not take anything for symptoms so far.  Patient reports she is [redacted] weeks pregnant.  She has also had some low urine output and urinary urgency without producing much urine.  Has incontinence when she coughs or sneezes.  She denies vaginal bleeding or discharge, decreased fetal movement, abdominal pain, or fever.  No vision changes or swelling in her lower extremities.  No dysuria, hematuria, new back pain, or nausea/vomiting.  No vaginal discharge.    Past Medical History:  Diagnosis Date   Anxiety     Patient Active Problem List   Diagnosis Date Noted   Obesity (BMI 30-39.9) 04/19/2022   UTI in pregnancy, antepartum, first trimester 01/22/2022   Thrombocytopenia affecting pregnancy, antepartum (HCC) 01/16/2022   Encounter for supervision of normal pregnancy, antepartum 01/03/2022   Generalized anxiety disorder 03/22/2020   Axillary adenopathy 03/22/2020   Rubella non-immune status, antepartum 04/26/2017    Past Surgical History:  Procedure Laterality Date   NO PAST SURGERIES      OB History     Gravida  5   Para  4   Term  4   Preterm      AB      Living  4      SAB      IAB      Ectopic      Multiple  0   Live Births  4        Obstetric Comments  [facility]          Home Medications    Prior to  Admission medications   Medication Sig Start Date End Date Taking? Authorizing Provider  oseltamivir (TAMIFLU) 75 MG capsule Take 1 capsule (75 mg total) by mouth every 12 (twelve) hours for 5 days. 06/21/22 06/26/22 Yes Valentino Nose, NP  Prenatal Vit-Fe Fumarate-FA (PRENATAL VITAMIN PO) Take by mouth.    [provider]  fluticasone (FLONASE) 50 MCG/ACT nasal spray Place 1 spray into both nostrils daily for 14 days. 01/17/20 03/09/20  AvegnoZachery Dakins, FNP    Family History Family History  Problem Relation Age of Onset   Asthma Mother    Healthy Mother    Healthy Father    Heart disease Maternal Grandfather    Hypertension Paternal Grandmother    Stroke Other    Coronary artery disease Other     Social History Social History   Tobacco Use   Smoking status: Former    Packs/day: 0.00    Years: 5.00    Total pack years: 0.00    Types: Cigarettes   Smokeless tobacco: Never  Vaping Use   Vaping Use: Never used  Substance Use Topics   Alcohol use: Not Currently   Drug use: No     Allergies   Penicillins and Wellbutrin [bupropion]   Review of Systems  Review of Systems Per HPI  Physical Exam Triage Vital Signs ED Triage Vitals  Enc Vitals Group     BP 06/21/22 1052 124/78     Pulse Rate 06/21/22 1052 (!) 101     Resp 06/21/22 1052 18     Temp 06/21/22 1052 98.1 F (36.7 C)     Temp Source 06/21/22 1052 Oral     SpO2 06/21/22 1052 96 %     Weight --      Height --      Head Circumference --      Peak Flow --      Pain Score 06/21/22 1053 0     Pain Loc --      Pain Edu? --      Excl. in GC? --    No data found.  Updated Vital Signs BP 124/78 (BP Location: Right Arm)   Pulse (!) 101   Temp 98.1 F (36.7 C) (Oral)   Resp 18   LMP 10/16/2021   SpO2 96%   Visual Acuity Right Eye Distance:   Left Eye Distance:   Bilateral Distance:    Right Eye Near:   Left Eye Near:    Bilateral Near:     Physical Exam Vitals and nursing note  reviewed.  Constitutional:      General: She is not in acute distress.    Appearance: Normal appearance. She is not ill-appearing or toxic-appearing.  HENT:     Head: Normocephalic and atraumatic.     Right Ear: Tympanic membrane, ear canal and external ear normal.     Left Ear: Tympanic membrane, ear canal and external ear normal.     Nose: Congestion and rhinorrhea present.     Mouth/Throat:     Mouth: Mucous membranes are moist.     Pharynx: Oropharynx is clear. Posterior oropharyngeal erythema present. No oropharyngeal exudate.  Eyes:     General: No scleral icterus.    Extraocular Movements: Extraocular movements intact.  Cardiovascular:     Rate and Rhythm: Normal rate and regular rhythm.  Pulmonary:     Effort: Pulmonary effort is normal. No respiratory distress.     Breath sounds: Normal breath sounds. No wheezing, rhonchi or rales.  Abdominal:     Tenderness: There is no abdominal tenderness. There is no right CVA tenderness or left CVA tenderness.     Comments: Gravid  Musculoskeletal:     Cervical back: Normal range of motion and neck supple.  Lymphadenopathy:     Cervical: No cervical adenopathy.  Skin:    General: Skin is warm and dry.     Coloration: Skin is not jaundiced or pale.     Findings: No erythema or rash.  Neurological:     Mental Status: She is alert and oriented to person, place, and time.  Psychiatric:        Behavior: Behavior is cooperative.      UC Treatments / Results  Labs (all labs ordered are listed, but only abnormal results are displayed) Labs Reviewed  POCT URINALYSIS DIP (MANUAL ENTRY) - Abnormal; Notable for the following components:      Result Value   Bilirubin, UA small (*)    Ketones, POC UA >= (160) (*)    Protein Ur, POC =30 (*)    Urobilinogen, UA >=8.0 (*)    All other components within normal limits  URINE CULTURE  COMPREHENSIVE METABOLIC PANEL  CBC WITH DIFFERENTIAL/PLATELET    EKG   Radiology No  results  found.  Procedures Procedures (including critical care time)  Medications Ordered in UC Medications - No data to display  Initial Impression / Assessment and Plan / UC Course  I have reviewed the triage vital signs and the nursing notes.  Pertinent labs & imaging results that were available during my care of the patient were reviewed by me and considered in my medical decision making (see chart for details).   Patient is well-appearing, normotensive, afebrile, not tachycardic, not tachypneic, oxygenating well on room air.    Exposure to influenza Viral URI with cough Suspect viral etiology Unable to perform viral testing given national backorder of testing materials Highly suspicious for influenza given known exposures Will treat empirically with Tamiflu twice daily for 5 days Supportive care discussed with patient Increase hydration with water  Urinary frequency [redacted] weeks gestation of pregnancy Urinalysis today without signs of UTI; given active pregnancy, urine culture sent I am concerned about bilirubin, ketones, and protein Recommended increasing hydration significantly She does not have any red flag symptoms including no decreased fetal movement, vaginal bleeding or discharge, lower extremity swelling Recommended close follow-up with OB/GYN and reaching out to them today regarding urinalysis CBC, CMP checked Follow-up closely with OB/GYN  The patient was given the opportunity to ask questions.  All questions answered to their satisfaction.  The patient is in agreement to this plan.    Final Clinical Impressions(s) / UC Diagnoses   Final diagnoses:  Exposure to influenza  Urinary frequency  Viral URI with cough  [redacted] weeks gestation of pregnancy     Discharge Instructions      I suspect you have influenza since your children have tested positive for it.  You can start the Tamiflu and take it to help shorten your symptoms.    I am concerned about your urine symptoms.   The UA today shows protein and bilirubin.  No signs of UTI today.  We are sending for a urine culture and will call you Monday if this shows we need to treat with antibiotics.  We have checked some blood work and will call you tomorrow if this is abnormal.  Please call your OB/GYN today and let them know what is going on, they may want to re-evaluate you in person.  If your symptoms worsen, go to the MAU.     ED Prescriptions     Medication Sig Dispense Auth. Provider   oseltamivir (TAMIFLU) 75 MG capsule Take 1 capsule (75 mg total) by mouth every 12 (twelve) hours for 5 days. 10 capsule Valentino Nose, NP      PDMP not reviewed this encounter.   Valentino Nose, NP 06/21/22 1408

## 2022-06-22 LAB — CBC WITH DIFFERENTIAL/PLATELET
Basophils Absolute: 0 10*3/uL (ref 0.0–0.2)
Basos: 0 %
EOS (ABSOLUTE): 0 10*3/uL (ref 0.0–0.4)
Eos: 0 %
Hematocrit: 34.4 % (ref 34.0–46.6)
Hemoglobin: 11.7 g/dL (ref 11.1–15.9)
Immature Grans (Abs): 0 10*3/uL (ref 0.0–0.1)
Immature Granulocytes: 1 %
Lymphocytes Absolute: 0.7 10*3/uL (ref 0.7–3.1)
Lymphs: 17 %
MCH: 31.4 pg (ref 26.6–33.0)
MCHC: 34 g/dL (ref 31.5–35.7)
MCV: 92 fL (ref 79–97)
Monocytes Absolute: 0.3 10*3/uL (ref 0.1–0.9)
Monocytes: 9 %
Neutrophils Absolute: 2.8 10*3/uL (ref 1.4–7.0)
Neutrophils: 73 %
Platelets: 98 10*3/uL — CL (ref 150–450)
RBC: 3.73 x10E6/uL — ABNORMAL LOW (ref 3.77–5.28)
RDW: 14.1 % (ref 11.7–15.4)
WBC: 3.9 10*3/uL (ref 3.4–10.8)

## 2022-06-22 LAB — COMPREHENSIVE METABOLIC PANEL
ALT: 17 IU/L (ref 0–32)
AST: 41 IU/L — ABNORMAL HIGH (ref 0–40)
Albumin/Globulin Ratio: 1.3 (ref 1.2–2.2)
Albumin: 3.3 g/dL — ABNORMAL LOW (ref 4.0–5.0)
Alkaline Phosphatase: 203 IU/L — ABNORMAL HIGH (ref 44–121)
BUN/Creatinine Ratio: 6 — ABNORMAL LOW (ref 9–23)
BUN: 3 mg/dL — ABNORMAL LOW (ref 6–20)
Bilirubin Total: 0.7 mg/dL (ref 0.0–1.2)
CO2: 19 mmol/L — ABNORMAL LOW (ref 20–29)
Calcium: 8.4 mg/dL — ABNORMAL LOW (ref 8.7–10.2)
Chloride: 104 mmol/L (ref 96–106)
Creatinine, Ser: 0.54 mg/dL — ABNORMAL LOW (ref 0.57–1.00)
Globulin, Total: 2.5 g/dL (ref 1.5–4.5)
Glucose: 88 mg/dL (ref 70–99)
Potassium: 3.8 mmol/L (ref 3.5–5.2)
Sodium: 138 mmol/L (ref 134–144)
Total Protein: 5.8 g/dL — ABNORMAL LOW (ref 6.0–8.5)
eGFR: 129 mL/min/{1.73_m2} (ref >59)

## 2022-06-23 LAB — URINE CULTURE: Culture: <10000 — AB

## 2022-06-28 ENCOUNTER — Encounter: Payer: Self-pay | Admitting: Advanced Practice Midwife

## 2022-06-28 ENCOUNTER — Ambulatory Visit (INDEPENDENT_AMBULATORY_CARE_PROVIDER_SITE_OTHER): Payer: Medicaid Other | Admitting: Advanced Practice Midwife

## 2022-06-28 ENCOUNTER — Other Ambulatory Visit (HOSPITAL_COMMUNITY)
Admission: RE | Admit: 2022-06-28 | Discharge: 2022-06-28 | Disposition: A | Discharge status: Home / Self Care | Payer: Medicaid Other | Source: Ambulatory Visit | Attending: Advanced Practice Midwife | Admitting: Advanced Practice Midwife

## 2022-06-28 VITALS — BP 113/79 | HR 77 | Wt 222.0 lb

## 2022-06-28 DIAGNOSIS — Z1332 Encounter for screening for maternal depression: Secondary | ICD-10-CM | POA: Diagnosis not present

## 2022-06-28 DIAGNOSIS — Z3480 Encounter for supervision of other normal pregnancy, unspecified trimester: Secondary | ICD-10-CM | POA: Diagnosis not present

## 2022-06-28 DIAGNOSIS — Z3A36 36 weeks gestation of pregnancy: Secondary | ICD-10-CM | POA: Diagnosis present

## 2022-06-28 DIAGNOSIS — D696 Thrombocytopenia, unspecified: Secondary | ICD-10-CM

## 2022-06-28 DIAGNOSIS — O99119 Other diseases of the blood and blood-forming organs and certain disorders involving the immune mechanism complicating pregnancy, unspecified trimester: Secondary | ICD-10-CM

## 2022-06-28 LAB — OB RESULTS CONSOLE GBS: GBS: NEGATIVE

## 2022-06-28 LAB — POCT URINALYSIS DIPSTICK OB
Blood, UA: NEGATIVE
Glucose, UA: NEGATIVE
Ketones, UA: NEGATIVE
Nitrite, UA: NEGATIVE

## 2022-06-28 NOTE — Progress Notes (Signed)
LOW-RISK PREGNANCY VISIT Patient name: Marissa Friedman MRN [id number]  Date of birth: 1995-10-29 Chief Complaint:   Routine Prenatal Visit (GBS, GC/CHL; pt requested urine check due to protein in urine recently)  History of Present Illness:   Marissa Friedman is a 26 y.o. G54P4004 female at [redacted]w[redacted]d with an Estimated Date of Delivery: 07/23/22 being seen today for ongoing management of a low-risk pregnancy.  Today she reports no c/o. Contractions: Not present. Vag. Bleeding: None.  Movement: Present. denies leaking of fluid. Went to urgent care a week ago w/flu sx (not tested d/t national shortage of testing supplies) treated with Tamiflu.  Much better now Review of Systems:   Pertinent items are noted in HPI Denies abnormal vaginal discharge w/ itching/odor/irritation, headaches, visual changes, shortness of breath, chest pain, abdominal pain, severe nausea/vomiting, or problems with urination or bowel movements unless otherwise stated above. Pertinent History Reviewed:  Reviewed past medical,surgical, social, obstetrical and family history.  Reviewed problem list, medications and allergies. Physical Assessment:   Vitals:   06/28/22 0958 06/28/22 1012  BP: (!) 121/92 113/79  Pulse: 88 77  Weight: 222 lb (100.7 kg)   Body mass index is 38.11 kg/m.        Physical Examination:   General appearance: Well appearing, and in no distress  Mental status: Alert, oriented to person, place, and time  Skin: Warm & dry  Cardiovascular: Normal heart rate noted  Respiratory: Normal respiratory effort, no distress  Abdomen: Soft, gravid, nontender  Pelvic: Cervical exam performed  Dilation: Fingertip Effacement (%): Thick Station: -2  Extremities:    Fetal Status: Fetal Heart Rate (bpm): 150 Fundal Height: 35 cm Movement: Present    Chaperone:  Malachy Mood     Results for orders placed or performed in visit on 06/28/22 (from the past 24 hour(s))  POC Urinalysis Dipstick OB   Collection  Time: 06/28/22 10:16 AM  Result Value Ref Range   Color, UA     Clarity, UA     Glucose, UA Negative Negative   Bilirubin, UA     Ketones, UA neg    Spec Grav, UA     Blood, UA neg    pH, UA     POC,PROTEIN,UA Small (1+) Negative, Trace, Small (1+), Moderate (2+), Large (3+), 4+   Urobilinogen, UA     Nitrite, UA neg    Leukocytes, UA Trace (A) Negative   Appearance     Odor      Assessment & Plan:  1) Low-risk pregnancy G5P4004 at [redacted]w[redacted]d with an Estimated Date of Delivery: 07/23/22   2) ITP, 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  Labs/procedures today: CBC, GBS, GC, CHL  Plan:  Continue routine obstetrical care  Next visit: prefers in person    Reviewed: Term labor symptoms and general obstetric precautions including but not limited to vaginal bleeding, contractions, leaking of fluid and fetal movement were reviewed in detail with the patient.  All questions were answered. Has home bp cuff.. Check bp weekly, let us know if >140/90.   Follow-up: No follow-ups on file.  Future Appointments  Date Time Provider Department Center  07/04/2022  8:50 AM Autry-Lott, Randa Evens, DO [facility]  07/12/2022  9:50 AM Jacklyn Shell, CNM [facility]  07/19/2022  8:30 AM Cresenzo-Dishmon, Scarlette Calico, CNM [facility]    Orders Placed This Encounter  Procedures   Strep Gp B NAA+Rflx   CBC  POC Urinalysis Dipstick OB   Jacklyn Shell DNP, CNM 06/28/2022 10:36 AM

## 2022-06-29 LAB — CERVICOVAGINAL ANCILLARY ONLY
Chlamydia: NEGATIVE
Comment: NEGATIVE
Comment: NORMAL
Neisseria Gonorrhea: NEGATIVE

## 2022-06-29 LAB — CBC
Hematocrit: 35.2 % (ref 34.0–46.6)
Hemoglobin: 12.1 g/dL (ref 11.1–15.9)
MCH: 31.2 pg (ref 26.6–33.0)
MCHC: 34.4 g/dL (ref 31.5–35.7)
MCV: 91 fL (ref 79–97)
Platelets: 166 10*3/uL (ref 150–450)
RBC: 3.88 x10E6/uL (ref 3.77–5.28)
RDW: 13.7 % (ref 11.7–15.4)
WBC: 5.7 10*3/uL (ref 3.4–10.8)

## 2022-07-02 LAB — STREP GP B SUSCEPTIBILITY

## 2022-07-02 LAB — STREP GP B NAA+RFLX: Strep Gp B NAA+Rflx: POSITIVE — AB

## 2022-07-02 NOTE — L&D Delivery Note (Signed)
OB/GYN Faculty Practice Delivery Note  Marissa Friedman is a 27 y.o. [id number] s/p SVD at [redacted]w[redacted]d. She was admitted for eIOL.   ROM: 5h 79m with clear fluid GBS Status:  --/Positive (12/28 1430) Maximum Maternal Temperature: 98.51F   Labor Progress: Initial SVE: 1.5/thick/anterior. She then progressed to complete.   Delivery Date/Time: 07/21/22 2046 Delivery: Called to room and patient was complete and pushing. Head delivered OA. No nuchal cord present. Shoulder and body delivered in usual fashion. Infant with spontaneous cry, placed on mother's abdomen, dried and stimulated. Cord clamped x 2 after 1-minute delay, and cut by FOB. Cord blood drawn. Placenta delivered spontaneously with gentle cord traction. Fundus firm with massage and Pitocin. Labia, perineum, vagina, and cervix inspected with no lacerations noted.  Baby Weight: pending  Placenta: 3 vessel, intact. Sent to L&D Complications: None Lacerations: none EBL: 26 mL Analgesia: Epidural   Infant:  APGAR (1 MIN): 9   APGAR (5 MINS): [place], DO OB Family Medicine Fellow, [facility] 07/21/2022, 9:01 PM

## 2022-07-04 ENCOUNTER — Encounter: Payer: Self-pay | Admitting: Family Medicine

## 2022-07-04 ENCOUNTER — Ambulatory Visit (INDEPENDENT_AMBULATORY_CARE_PROVIDER_SITE_OTHER): Payer: Medicaid Other | Admitting: Family Medicine

## 2022-07-04 VITALS — BP 116/75 | HR 76 | Wt 218.0 lb

## 2022-07-04 DIAGNOSIS — Z2839 Other underimmunization status: Secondary | ICD-10-CM

## 2022-07-04 DIAGNOSIS — Z3A37 37 weeks gestation of pregnancy: Secondary | ICD-10-CM

## 2022-07-04 DIAGNOSIS — Z3480 Encounter for supervision of other normal pregnancy, unspecified trimester: Secondary | ICD-10-CM

## 2022-07-04 DIAGNOSIS — O99820 Streptococcus B carrier state complicating pregnancy: Secondary | ICD-10-CM | POA: Insufficient documentation

## 2022-07-04 DIAGNOSIS — O09899 Supervision of other high risk pregnancies, unspecified trimester: Secondary | ICD-10-CM

## 2022-07-04 NOTE — Progress Notes (Cosign Needed Addendum)
   PRENATAL VISIT NOTE  Subjective:  Marissa Friedman is a 27 y.o. [id number] at 24w2dbeing seen today for ongoing prenatal care.  She is currently monitored for the following issues for this low-risk pregnancy and has Rubella non-immune status, antepartum; Generalized anxiety disorder; Axillary adenopathy; Encounter for supervision of normal pregnancy, antepartum; Thrombocytopenia affecting pregnancy, [place]; UTI in pregnancy, antepartum, first trimester; Obesity (BMI 30-39.9); and Group B Streptococcus carrier, antepartum on their problem list.  Patient reports no complaints.  Contractions: Not present. Vag. Bleeding: None.  Movement: Present. Denies leaking of fluid.   The following portions of the patient's history were reviewed and updated as appropriate: allergies, current medications, past family history, past medical history, past social history, past surgical history and problem list.   Objective:   Vitals:   07/04/22 0857  BP: 116/75  Pulse: 76  Weight: 218 lb (98.9 kg)    Fetal Status: Fetal Heart Rate (bpm): 135 Fundal Height: 37 cm Movement: Present     General:  Alert, oriented and cooperative. Patient is in no acute distress.  Skin: Skin is warm and dry. No rash noted.   Cardiovascular: Normal heart rate noted  Respiratory: Normal respiratory effort, no problems with respiration noted  Abdomen: Soft, gravid, appropriate for gestational age.  Pain/Pressure: Absent     Pelvic: Cervical exam performed in the presence of a chaperone Dilation: 1 Effacement (%): Thick Station: Ballotable  Extremities: Normal range of motion.     Mental Status: Normal mood and affect. Normal behavior. Normal judgment and thought content.   Assessment and Plan:  Pregnancy: [id number] 314w2d. Supervision of other normal pregnancy, antepartum Doing well. No concerns.   2. [redacted] weeks gestation of pregnancy Follow up in 1 week. Reasons to go to [facility] reviewed  3. Group B Streptococcus  carrier, antepartum Abx in labor.   4. Rubella non-immune status, antepartum MMR pp   Term labor symptoms and general obstetric precautions including but not limited to vaginal bleeding, contractions, leaking of fluid and fetal movement were reviewed in detail with the patient. Please refer to After Visit Summary for other counseling recommendations.   No follow-ups on file.  Future Appointments  Date Time Provider [place]1/05/2023  9:50 AM CrChristin FudgeCNM [facility]  07/19/2022  8:30 AM Cresenzo-Dishmon, FrJoaquim LaiCNM [facility]    [place] Autry-Lott, DO

## 2022-07-12 ENCOUNTER — Ambulatory Visit (INDEPENDENT_AMBULATORY_CARE_PROVIDER_SITE_OTHER): Payer: Medicaid Other | Admitting: Advanced Practice Midwife

## 2022-07-12 VITALS — BP 126/84 | HR 72 | Wt 224.0 lb

## 2022-07-12 DIAGNOSIS — Z3A38 38 weeks gestation of pregnancy: Secondary | ICD-10-CM

## 2022-07-12 DIAGNOSIS — E669 Obesity, unspecified: Secondary | ICD-10-CM

## 2022-07-12 DIAGNOSIS — Z3483 Encounter for supervision of other normal pregnancy, third trimester: Secondary | ICD-10-CM

## 2022-07-12 MED ORDER — PREDNISONE 10 MG PO TABS: 10.0000 mg | ORAL_TABLET | Freq: Every day | ORAL | 0 refills | Status: DC

## 2022-07-12 NOTE — Progress Notes (Signed)
   LOW-RISK PREGNANCY VISIT Patient name: Marissa Friedman MRN [id number]  Date of birth: September 03, 1995 Chief Complaint:   Routine Prenatal Visit  History of Present Illness:   Marissa Friedman is a 27 y.o. G14P4004 female at [redacted]w[redacted]d with an Estimated Date of Delivery: 07/23/22 being seen today for ongoing management of a low-risk pregnancy.  Today she reports no complaints. Contractions: Not present. Vag. Bleeding: None.  Movement: Present. denies leaking of fluid. Review of Systems:   Pertinent items are noted in HPI Denies abnormal vaginal discharge w/ itching/odor/irritation, headaches, visual changes, shortness of breath, chest pain, abdominal pain, severe nausea/vomiting, or problems with urination or bowel movements unless otherwise stated above. Pertinent History Reviewed:  Reviewed past medical,surgical, social, obstetrical and family history.  Reviewed problem list, medications and allergies. Physical Assessment:   Vitals:   07/12/22 1613  BP: 126/84  Pulse: 72  Weight: 224 lb (101.6 kg)  Body mass index is 38.45 kg/m.        Physical Examination:   General appearance: Well appearing, and in no distress  Mental status: Alert, oriented to person, place, and time  Skin: Warm & dry  Cardiovascular: Normal heart rate noted  Respiratory: Normal respiratory effort, no distress  Abdomen: Soft, gravid, nontender  Pelvic: Cervical exam performed  Dilation: 1.5 Effacement (%): Thick Station: -3 presentation confirmed w/US  Extremities: Edema: None  Fetal Status: Fetal Heart Rate (bpm): 142 Fundal Height: 38 cm Movement: Present    Chaperone:  Andrez Grime    No results found for this or any previous visit (from the past 24 hour(s)).  Assessment & Plan:  1) Low-risk pregnancy G5P4004 at [redacted]w[redacted]d with an Estimated Date of Delivery: 07/23/22   2) ITP, prednisone 10mg  daily until delivery  3) Obesity:  IOL 1/20, orders in   Meds:  Meds ordered this encounter  Medications    predniSONE (DELTASONE) 10 MG tablet    Sig: Take 1 tablet (10 mg total) by mouth daily with breakfast.    Dispense:  10 tablet    Refill:  0    Order Specific Question:   Supervising Provider    Answer:   Florian Buff [[id number]]   Labs/procedures today: none  Plan:  Continue routine obstetrical care  Next visit: prefers in person    Reviewed: Term labor symptoms and general obstetric precautions including but not limited to vaginal bleeding, contractions, leaking of fluid and fetal movement were reviewed in detail with the patient.  All questions were answered. Has home bp cuff. . Check bp weekly, let us know if >140/90.   Follow-up: No follow-ups on file.  Future Appointments  Date Time Provider [place]  07/19/2022  8:30 AM Christin Fudge, CNM [facility]  07/21/2022  7:15 AM [facility] SCHED ROOM [facility] None    No orders of the defined types were placed in this encounter.  Christin Fudge DNP, CNM 07/12/2022 4:52 PM

## 2022-07-13 ENCOUNTER — Encounter (HOSPITAL_COMMUNITY): Payer: Self-pay | Admitting: *Deleted

## 2022-07-13 ENCOUNTER — Telehealth (HOSPITAL_COMMUNITY): Payer: Self-pay | Admitting: *Deleted

## 2022-07-13 NOTE — Telephone Encounter (Signed)
Preadmission screen  

## 2022-07-18 ENCOUNTER — Other Ambulatory Visit: Payer: Self-pay | Admitting: Advanced Practice Midwife

## 2022-07-19 ENCOUNTER — Ambulatory Visit (INDEPENDENT_AMBULATORY_CARE_PROVIDER_SITE_OTHER): Payer: Medicaid Other | Admitting: Advanced Practice Midwife

## 2022-07-19 ENCOUNTER — Encounter: Payer: Self-pay | Admitting: Advanced Practice Midwife

## 2022-07-19 VITALS — BP 121/84 | HR 79 | Wt 220.0 lb

## 2022-07-19 DIAGNOSIS — Z3480 Encounter for supervision of other normal pregnancy, unspecified trimester: Secondary | ICD-10-CM

## 2022-07-19 DIAGNOSIS — Z3A39 39 weeks gestation of pregnancy: Secondary | ICD-10-CM

## 2022-07-19 NOTE — Progress Notes (Signed)
   LOW-RISK PREGNANCY VISIT Patient name: Marissa Friedman MRN [id number]  Date of birth: 01-12-1996 Chief Complaint:   Routine Prenatal Visit  History of Present Illness:   Marissa Friedman is a 27 y.o. G43P4004 female at [redacted]w[redacted]d with an Estimated Date of Delivery: 07/23/22 being seen today for ongoing management of a low-risk pregnancy.  Today she reports no complaints. Contractions: Not present.  .  Movement: Present. denies leaking of fluid. Review of Systems:   Pertinent items are noted in HPI Denies abnormal vaginal discharge w/ itching/odor/irritation, headaches, visual changes, shortness of breath, chest pain, abdominal pain, severe nausea/vomiting, or problems with urination or bowel movements unless otherwise stated above. Pertinent History Reviewed:  Reviewed past medical,surgical, social, obstetrical and family history.  Reviewed problem list, medications and allergies. Physical Assessment:   Vitals:   07/19/22 0829  BP: 121/84  Pulse: 79  Weight: 220 lb (99.8 kg)  Body mass index is 37.76 kg/m.        Physical Examination:   General appearance: Well appearing, and in no distress  Mental status: Alert, oriented to person, place, and time  Skin: Warm & dry  Cardiovascular: Normal heart rate noted  Respiratory: Normal respiratory effort, no distress  Abdomen: Soft, gravid, nontender  Pelvic: Cervical exam deferred         Extremities: Edema: None  Fetal Status:     Movement: Present    Chaperone:  N/A    No results found for this or any previous visit (from the past 24 hour(s)).  Assessment & Plan:  1) Low-risk pregnancy G5P4004 at [redacted]w[redacted]d with an Estimated Date of Delivery: 07/23/22   2) eIOL scheduled for 1/20, orders in,    Meds: No orders of the defined types were placed in this encounter.  Labs/procedures today: none  Plan:  Continue routine obstetrical care  Next visit: prefers online    Reviewed: Term labor symptoms and general obstetric precautions  including but not limited to vaginal bleeding, contractions, leaking of fluid and fetal movement were reviewed in detail with the patient.  All questions were answered   Follow-up: Return in about 5 weeks (around 08/23/2022) for video postpartum .  Future Appointments  Date Time Provider [place]  07/21/2022  7:15 AM [facility] SCHED ROOM [facility] None    No orders of the defined types were placed in this encounter.  Christin Fudge DNP, CNM 07/19/2022 8:48 AM gf

## 2022-07-21 ENCOUNTER — Inpatient Hospital Stay (HOSPITAL_COMMUNITY): Payer: Medicaid Other | Admitting: Anesthesiology

## 2022-07-21 ENCOUNTER — Inpatient Hospital Stay (HOSPITAL_COMMUNITY): Payer: Medicaid Other | Attending: Obstetrics and Gynecology

## 2022-07-21 ENCOUNTER — Inpatient Hospital Stay (HOSPITAL_COMMUNITY)
Admission: RE | Admit: 2022-07-21 | Discharge: 2022-07-23 | DRG: 806 | Disposition: A | Discharge status: Home / Self Care | Payer: Medicaid Other | Attending: Obstetrics and Gynecology | Admitting: Obstetrics and Gynecology

## 2022-07-21 ENCOUNTER — Encounter (HOSPITAL_COMMUNITY): Payer: Self-pay | Admitting: Family Medicine

## 2022-07-21 DIAGNOSIS — O99214 Obesity complicating childbirth: Principal | ICD-10-CM | POA: Diagnosis present

## 2022-07-21 DIAGNOSIS — D6959 Other secondary thrombocytopenia: Secondary | ICD-10-CM | POA: Diagnosis present

## 2022-07-21 DIAGNOSIS — F411 Generalized anxiety disorder: Secondary | ICD-10-CM | POA: Diagnosis present

## 2022-07-21 DIAGNOSIS — O99344 Other mental disorders complicating childbirth: Secondary | ICD-10-CM | POA: Diagnosis present

## 2022-07-21 DIAGNOSIS — Z87891 Personal history of nicotine dependence: Secondary | ICD-10-CM

## 2022-07-21 DIAGNOSIS — O99824 Streptococcus B carrier state complicating childbirth: Secondary | ICD-10-CM | POA: Diagnosis present

## 2022-07-21 DIAGNOSIS — Z3A39 39 weeks gestation of pregnancy: Secondary | ICD-10-CM

## 2022-07-21 DIAGNOSIS — Z3490 Encounter for supervision of normal pregnancy, unspecified, unspecified trimester: Secondary | ICD-10-CM

## 2022-07-21 DIAGNOSIS — Z880 Allergy status to penicillin: Secondary | ICD-10-CM

## 2022-07-21 DIAGNOSIS — O26893 Other specified pregnancy related conditions, third trimester: Secondary | ICD-10-CM | POA: Diagnosis present

## 2022-07-21 DIAGNOSIS — O09899 Supervision of other high risk pregnancies, unspecified trimester: Secondary | ICD-10-CM

## 2022-07-21 DIAGNOSIS — O99820 Streptococcus B carrier state complicating pregnancy: Secondary | ICD-10-CM

## 2022-07-21 DIAGNOSIS — E669 Obesity, unspecified: Secondary | ICD-10-CM | POA: Diagnosis present

## 2022-07-21 DIAGNOSIS — D696 Thrombocytopenia, unspecified: Secondary | ICD-10-CM | POA: Diagnosis present

## 2022-07-21 DIAGNOSIS — O9912 Other diseases of the blood and blood-forming organs and certain disorders involving the immune mechanism complicating childbirth: Secondary | ICD-10-CM | POA: Diagnosis present

## 2022-07-21 LAB — TYPE AND SCREEN
ABO/RH(D): O POS
Antibody Screen: NEGATIVE

## 2022-07-21 LAB — CBC
HCT: 34.4 % — ABNORMAL LOW (ref 36.0–46.0)
Hemoglobin: 12.4 g/dL (ref 12.0–15.0)
MCH: 31.6 pg (ref 26.0–34.0)
MCHC: 36 g/dL (ref 30.0–36.0)
MCV: 87.5 fL (ref 80.0–100.0)
Platelets: 178 10*3/uL (ref 150–400)
RBC: 3.93 MIL/uL (ref 3.87–5.11)
RDW: 14.9 % (ref 11.5–15.5)
WBC: 8.4 10*3/uL (ref 4.0–10.5)
nRBC: 0 % (ref 0.0–0.2)

## 2022-07-21 LAB — RPR: RPR Ser Ql: NONREACTIVE

## 2022-07-21 MED ORDER — CEFAZOLIN SODIUM-DEXTROSE 2-4 GM/100ML-% IV SOLN
2.0000 g | Freq: Once | INTRAVENOUS | Status: AC
  Administered 2022-07-21: 2 g via INTRAVENOUS
  Filled 2022-07-21: qty 100

## 2022-07-21 MED ORDER — ACETAMINOPHEN 325 MG PO TABS: 650.0000 mg | ORAL_TABLET | ORAL | Status: DC | PRN

## 2022-07-21 MED ORDER — OXYTOCIN-SODIUM CHLORIDE 30-0.9 UT/500ML-% IV SOLN
2.5000 [IU]/h | INTRAVENOUS | Status: DC
  Filled 2022-07-21: qty 500

## 2022-07-21 MED ORDER — OXYCODONE-ACETAMINOPHEN 5-325 MG PO TABS: 2.0000 | ORAL_TABLET | ORAL | Status: DC | PRN

## 2022-07-21 MED ORDER — SOD CITRATE-CITRIC ACID 500-334 MG/5ML PO SOLN: 30.0000 mL | ORAL | Status: DC | PRN

## 2022-07-21 MED ORDER — MISOPROSTOL 25 MCG QUARTER TABLET
25.0000 ug | ORAL_TABLET | Freq: Once | ORAL | Status: AC
  Administered 2022-07-21: 25 ug via VAGINAL
  Filled 2022-07-21: qty 1

## 2022-07-21 MED ORDER — LACTATED RINGERS IV SOLN: INTRAVENOUS | Status: DC

## 2022-07-21 MED ORDER — FLEET ENEMA 7-19 GM/118ML RE ENEM: 1.0000 | ENEMA | RECTAL | Status: DC | PRN

## 2022-07-21 MED ORDER — OXYTOCIN BOLUS FROM INFUSION: 333.0000 mL | Freq: Once | INTRAVENOUS | Status: DC

## 2022-07-21 MED ORDER — PRENATAL MULTIVITAMIN CH
1.0000 | ORAL_TABLET | Freq: Every day | ORAL | Status: DC
  Administered 2022-07-22: 1 via ORAL
  Filled 2022-07-21: qty 1

## 2022-07-21 MED ORDER — SENNOSIDES-DOCUSATE SODIUM 8.6-50 MG PO TABS
2.0000 | ORAL_TABLET | ORAL | Status: DC
  Administered 2022-07-22: 2 via ORAL
  Filled 2022-07-21 (×2): qty 2

## 2022-07-21 MED ORDER — ONDANSETRON HCL 4 MG/2ML IJ SOLN
4.0000 mg | Freq: Four times a day (QID) | INTRAMUSCULAR | Status: DC | PRN
  Administered 2022-07-21: 4 mg via INTRAVENOUS
  Filled 2022-07-21: qty 2

## 2022-07-21 MED ORDER — TETANUS-DIPHTH-ACELL PERTUSSIS 5-2.5-18.5 LF-MCG/0.5 IM SUSY: 0.5000 mL | PREFILLED_SYRINGE | Freq: Once | INTRAMUSCULAR | Status: DC

## 2022-07-21 MED ORDER — PHENYLEPHRINE 80 MCG/ML (10ML) SYRINGE FOR IV PUSH (FOR BLOOD PRESSURE SUPPORT): 80.0000 ug | PREFILLED_SYRINGE | INTRAVENOUS | Status: DC | PRN

## 2022-07-21 MED ORDER — MEASLES, MUMPS & RUBELLA VAC IJ SOLR: 0.5000 mL | Freq: Once | INTRAMUSCULAR | Status: DC

## 2022-07-21 MED ORDER — WITCH HAZEL-GLYCERIN EX PADS: 1.0000 | MEDICATED_PAD | CUTANEOUS | Status: DC | PRN

## 2022-07-21 MED ORDER — ONDANSETRON HCL 4 MG/2ML IJ SOLN: 4.0000 mg | INTRAMUSCULAR | Status: DC | PRN

## 2022-07-21 MED ORDER — DIPHENHYDRAMINE HCL 50 MG/ML IJ SOLN
12.5000 mg | INTRAMUSCULAR | Status: DC | PRN
  Administered 2022-07-21: 12.5 mg via INTRAVENOUS
  Filled 2022-07-21: qty 1

## 2022-07-21 MED ORDER — DIPHENHYDRAMINE HCL 25 MG PO CAPS: 25.0000 mg | ORAL_CAPSULE | Freq: Four times a day (QID) | ORAL | Status: DC | PRN

## 2022-07-21 MED ORDER — SODIUM CHLORIDE 0.9% FLUSH: 3.0000 mL | Freq: Two times a day (BID) | INTRAVENOUS | Status: DC

## 2022-07-21 MED ORDER — MISOPROSTOL 50MCG HALF TABLET
50.0000 ug | ORAL_TABLET | Freq: Once | ORAL | Status: AC
  Administered 2022-07-21: 50 ug via ORAL
  Filled 2022-07-21: qty 1

## 2022-07-21 MED ORDER — LACTATED RINGERS IV SOLN: 500.0000 mL | INTRAVENOUS | Status: DC | PRN

## 2022-07-21 MED ORDER — SODIUM CHLORIDE 0.9% FLUSH: 3.0000 mL | INTRAVENOUS | Status: DC | PRN

## 2022-07-21 MED ORDER — COCONUT OIL OIL: 1.0000 | TOPICAL_OIL | Status: DC | PRN

## 2022-07-21 MED ORDER — ZOLPIDEM TARTRATE 5 MG PO TABS: 5.0000 mg | ORAL_TABLET | Freq: Every evening | ORAL | Status: DC | PRN

## 2022-07-21 MED ORDER — LIDOCAINE HCL (PF) 1 % IJ SOLN
INTRAMUSCULAR | Status: DC | PRN
  Administered 2022-07-21 (×2): 4 mL via EPIDURAL

## 2022-07-21 MED ORDER — CEFAZOLIN SODIUM-DEXTROSE 1-4 GM/50ML-% IV SOLN
1.0000 g | Freq: Three times a day (TID) | INTRAVENOUS | Status: DC
  Administered 2022-07-21: 1 g via INTRAVENOUS
  Filled 2022-07-21 (×3): qty 50

## 2022-07-21 MED ORDER — EPHEDRINE 5 MG/ML INJ: 10.0000 mg | INTRAVENOUS | Status: DC | PRN

## 2022-07-21 MED ORDER — HYDROXYZINE HCL 50 MG PO TABS: 50.0000 mg | ORAL_TABLET | Freq: Four times a day (QID) | ORAL | Status: DC | PRN

## 2022-07-21 MED ORDER — TERBUTALINE SULFATE 1 MG/ML IJ SOLN: 0.2500 mg | Freq: Once | INTRAMUSCULAR | Status: DC | PRN

## 2022-07-21 MED ORDER — BENZOCAINE-MENTHOL 20-0.5 % EX AERO: 1.0000 | INHALATION_SPRAY | CUTANEOUS | Status: DC | PRN

## 2022-07-21 MED ORDER — OXYTOCIN-SODIUM CHLORIDE 30-0.9 UT/500ML-% IV SOLN
1.0000 m[IU]/min | INTRAVENOUS | Status: DC
  Administered 2022-07-21: 2 m[IU]/min via INTRAVENOUS

## 2022-07-21 MED ORDER — LACTATED RINGERS IV SOLN: 500.0000 mL | Freq: Once | INTRAVENOUS | Status: DC

## 2022-07-21 MED ORDER — SODIUM CHLORIDE 0.9 % IV SOLN: 250.0000 mL | INTRAVENOUS | Status: DC | PRN

## 2022-07-21 MED ORDER — DIBUCAINE (PERIANAL) 1 % EX OINT: 1.0000 | TOPICAL_OINTMENT | CUTANEOUS | Status: DC | PRN

## 2022-07-21 MED ORDER — LIDOCAINE HCL (PF) 1 % IJ SOLN: 30.0000 mL | INTRAMUSCULAR | Status: DC | PRN

## 2022-07-21 MED ORDER — ONDANSETRON HCL 4 MG PO TABS: 4.0000 mg | ORAL_TABLET | ORAL | Status: DC | PRN

## 2022-07-21 MED ORDER — IBUPROFEN 600 MG PO TABS
600.0000 mg | ORAL_TABLET | Freq: Four times a day (QID) | ORAL | Status: DC
  Administered 2022-07-21 – 2022-07-23 (×6): 600 mg via ORAL
  Filled 2022-07-21 (×5): qty 1

## 2022-07-21 MED ORDER — SIMETHICONE 80 MG PO CHEW: 80.0000 mg | CHEWABLE_TABLET | ORAL | Status: DC | PRN

## 2022-07-21 MED ORDER — FENTANYL CITRATE (PF) 100 MCG/2ML IJ SOLN: 50.0000 ug | INTRAMUSCULAR | Status: DC | PRN

## 2022-07-21 MED ORDER — OXYCODONE-ACETAMINOPHEN 5-325 MG PO TABS: 1.0000 | ORAL_TABLET | ORAL | Status: DC | PRN

## 2022-07-21 MED ORDER — FENTANYL-BUPIVACAINE-NACL 0.5-0.125-0.9 MG/250ML-% EP SOLN
12.0000 mL/h | EPIDURAL | Status: DC | PRN
  Administered 2022-07-21: 12 mL/h via EPIDURAL
  Filled 2022-07-21: qty 250

## 2022-07-21 NOTE — Progress Notes (Signed)
LABOR PROGRESS NOTE  Marissa Friedman is a 27 y.o. [id number] at [redacted]w[redacted]d  admitted for elective induction of labor  Subjective: Comfortable with epidural.  Objective: BP 111/61   Pulse 88   Temp 98 F (36.7 C) (Oral)   Resp 20   Ht 5\' 3"  (1.6 m)   Wt 99.6 kg   LMP 10/16/2021   SpO2 100%   BMI 38.88 kg/m  or  Vitals:   07/21/22 1435 07/21/22 1500 07/21/22 1600 07/21/22 1630  BP: (!) 112/50 (!) 120/56 123/66 111/61  Pulse: 70 (!) 123 (!) 111 88  Resp:  18 18 20   Temp:  97.9 F (36.6 C)  98 F (36.7 C)  TempSrc:  Oral  Oral  SpO2:      Weight:      Height:        Dilation: 4.5 Effacement (%): 60 Cervical Position: Anterior Station: -2, -3 Presentation: Vertex Exam by:: dr Caron Presume FHT: baseline rate 125, moderate varibility, + acel, no decel Toco: Irritability  Labs: Lab Results  Component Value Date   WBC 8.4 07/21/2022   HGB 12.4 07/21/2022   HCT 34.4 (L) 07/21/2022   MCV 87.5 07/21/2022   PLT 178 07/21/2022    Patient Active Problem List   Diagnosis Date Noted   Indication for care in labor or delivery 07/21/2022   Group B Streptococcus carrier, antepartum 07/04/2022   Obesity (BMI 30-39.9) 04/19/2022   UTI in pregnancy, antepartum, first trimester 01/22/2022   Thrombocytopenia affecting pregnancy, antepartum ([place]) 01/16/2022   Encounter for supervision of normal pregnancy, antepartum 01/03/2022   Generalized anxiety disorder 03/22/2020   Axillary adenopathy 03/22/2020   Rubella non-immune status, antepartum 04/26/2017    Assessment / Plan: 27 y.o. G5P4004 at [redacted]w[redacted]d here for eIOL  Labor: Progressing well.  Foley bulb out.  Started on Pitocin.  AROM with clear fluid. Fetal Wellbeing: Category 1 Pain Control: Epidural Anticipated MOD: Vaginal  Gifford Shave, MD  OB fellow 07/21/2022, 5:07 PM

## 2022-07-21 NOTE — Anesthesia Procedure Notes (Signed)
Epidural Patient location during procedure: OB Start time: 07/21/2022 11:40 AM End time: 07/21/2022 11:43 AM  Staffing Anesthesiologist: Brennan Bailey, MD Performed: anesthesiologist   Preanesthetic Checklist Completed: patient identified, IV checked, risks and benefits discussed, monitors and equipment checked, pre-op evaluation and timeout performed  Epidural Patient position: sitting Prep: DuraPrep and site prepped and draped Patient monitoring: continuous pulse ox, blood pressure and heart rate Approach: midline Location: L3-L4 Injection technique: LOR air  Needle:  Needle type: Tuohy  Needle gauge: 17 G Needle length: 9 cm Needle insertion depth: 6 cm Catheter type: closed end flexible Catheter size: 19 Gauge Catheter at skin depth: 11 cm Test dose: negative and Other (1% lidocaine)  Assessment Events: blood not aspirated, no cerebrospinal fluid, injection not painful, no injection resistance, no paresthesia and negative IV test  Additional Notes Patient identified. Risks, benefits, and alternatives discussed with patient including but not limited to bleeding, infection, nerve damage, paralysis, failed block, incomplete pain control, headache, blood pressure changes, nausea, vomiting, reactions to medication, itching, and postpartum back pain. Confirmed with bedside nurse the patient's most recent platelet count. Confirmed with patient that they are not currently taking any anticoagulation, have any bleeding history, or any family history of bleeding disorders. Patient expressed understanding and wished to proceed. All questions were answered. Sterile technique was used throughout the entire procedure. Please see nursing notes for vital signs.   Crisp LOR on first pass. Test dose was given through epidural catheter and negative prior to continuing to dose epidural or start infusion. Warning signs of high block given to the patient including shortness of breath,  tingling/numbness in hands, complete motor block, or any concerning symptoms with instructions to call for help. Patient was given instructions on fall risk and not to get out of bed. All questions and concerns addressed with instructions to call with any issues or inadequate analgesia.  Reason for block:procedure for pain

## 2022-07-21 NOTE — Anesthesia Preprocedure Evaluation (Signed)
Anesthesia Evaluation  Patient identified by MRN, date of birth, ID band Patient awake    Reviewed: Allergy & Precautions, Patient's Chart, lab work & pertinent test results  History of Anesthesia Complications Negative for: history of anesthetic complications  Airway Mallampati: II  TM Distance: >3 FB Neck ROM: Full    Dental no notable dental hx.    Pulmonary former smoker   Pulmonary exam normal        Cardiovascular negative cardio ROS Normal cardiovascular exam     Neuro/Psych   Anxiety     negative neurological ROS     GI/Hepatic negative GI ROS, Neg liver ROS,,,  Endo/Other  negative endocrine ROS    Renal/GU negative Renal ROS  negative genitourinary   Musculoskeletal negative musculoskeletal ROS (+)    Abdominal   Peds  Hematology gThrombocytopenia on prednisone   Anesthesia Other Findings Day of surgery medications reviewed with patient.  Reproductive/Obstetrics (+) Pregnancy                              Anesthesia Physical Anesthesia Plan  ASA: 2  Anesthesia Plan: Epidural   Post-op Pain Management:    Induction:   PONV Risk Score and Plan: Treatment may vary due to age or medical condition  Airway Management Planned: Natural Airway  Additional Equipment: Fetal Monitoring  Intra-op Plan:   Post-operative Plan:   Informed Consent: I have reviewed the patients History and Physical, chart, labs and discussed the procedure including the risks, benefits and alternatives for the proposed anesthesia with the patient or authorized representative who has indicated his/her understanding and acceptance.       Plan Discussed with:   Anesthesia Plan Comments:          Anesthesia Quick Evaluation

## 2022-07-21 NOTE — H&P (Signed)
OBSTETRIC ADMISSION HISTORY AND PHYSICAL  Marissa Friedman is a 27 y.o. female [phone] with IUP at [redacted]w[redacted]d by LMP presenting for eIOL. She reports +FMs, No LOF, no VB, no blurry vision, headaches or peripheral edema, and RUQ pain.  She plans on formula feeding. She request her partner to get a vasectomy for birth control. She received her prenatal care at [facility]   Dating: By LMP --->  Estimated Date of Delivery: 07/23/22  Sono:    @[redacted]w[redacted]d , CWD, normal anatomy, cephalic presentation, anterior placenta, 2080 g, 56 % EFW   Prenatal History/Complications: None  Past Medical History: Past Medical History:  Diagnosis Date   Anxiety    Gestational thrombocytopenia ([place])     Past Surgical History: Past Surgical History:  Procedure Laterality Date   NO PAST SURGERIES      Obstetrical History: OB History     Gravida  5   Para  4   Term  4   Preterm      AB      Living  4      SAB      IAB      Ectopic      Multiple  0   Live Births  4        Obstetric Comments  [facility]         Social History Social History   Socioeconomic History   Marital status: Married    Spouse name: Not on file   Number of children: 3   Years of education: Not on file   Highest education level: Not on file  Occupational History   Occupation: Stay at home mom  Tobacco Use   Smoking status: Former    Packs/day: 0.00    Years: 5.00    Total pack years: 0.00    Types: Cigarettes   Smokeless tobacco: Never  Vaping Use   Vaping Use: Never used  Substance and Sexual Activity   Alcohol use: Not Currently   Drug use: No   Sexual activity: Yes    Birth control/protection: None  Other Topics Concern   Not on file  Social History Narrative   Not on file   Social Determinants of Health   Financial Resource Strain: Low Risk  (04/19/2022)   Overall Financial Resource Strain (CARDIA)    Difficulty of Paying Living Expenses: Not hard at all  Food  Insecurity: No Food Insecurity (07/21/2022)   Hunger Vital Sign    Worried About Running Out of Food in the Last Year: Never true    Ran Out of Food in the Last Year: Never true  Transportation Needs: No Transportation Needs (07/21/2022)   PRAPARE - Hydrologist (Medical): No    Lack of Transportation (Non-Medical): No  Physical Activity: Insufficiently Active (04/19/2022)   Exercise Vital Sign    Days of Exercise per Week: 1 day    Minutes of Exercise per Session: 60 min  Stress: No Stress Concern Present (04/19/2022)   [place]    Feeling of Stress : Not at all  Social Connections: Moderately Integrated (04/19/2022)   Social Connection and Isolation Panel [NHANES]    Frequency of Communication with Friends and Family: More than three times a week    Frequency of Social Gatherings with Friends and Family: Once a week    Attends Religious Services: 1 to 4 times per year    Active  Member of Clubs or Organizations: No    Attends Engineer, structural: Never    Marital Status: Married    Family History: Family History  Problem Relation Age of Onset   Asthma Mother    Healthy Mother    Healthy Father    Heart disease Maternal Grandfather    Hypertension Paternal Grandmother    Stroke Other    Coronary artery disease Other     Allergies: Allergies  Allergen Reactions   Penicillins Swelling    Childhood reaction Has patient had a PCN reaction causing immediate rash, facial/tongue/throat swelling, SOB or lightheadedness with hypotension: Unknown Has patient had a PCN reaction causing severe rash involving mucus membranes or skin necrosis: No Has patient had a PCN reaction that required hospitalization: No Has patient had a PCN reaction occurring within the last 10 years: No If all of the above answers are "NO", then may proceed with Cephalosporin use.   Wellbutrin [Bupropion]  Swelling    Pt unsure of reaction.- facial swelling- childhood    Medications Prior to Admission  Medication Sig Dispense Refill Last Dose   predniSONE (DELTASONE) 10 MG tablet Take 1 tablet (10 mg total) by mouth daily with breakfast. 10 tablet 0    Prenatal Vit-Fe Fumarate-FA (PRENATAL VITAMIN PO) Take by mouth.        Review of Systems   All systems reviewed and negative except as stated in HPI  Blood pressure 129/77, pulse 73, temperature 97.9 F (36.6 C), temperature source Oral, resp. rate 18, height 5\' 3"  (1.6 m), weight 99.6 kg, last menstrual period 10/16/2021. General appearance: alert, cooperative, and appears stated age Lungs:  normal work of breathing  Heart: regular rate  Abdomen: gravid  Pelvic: 1.5/50/-3 Extremities: Homans sign is negative, no sign of DVT  Presentation: cephalic Fetal monitoringBaseline: 125 bpm, Variability: Good {> 6 bpm), Accelerations: Reactive, and Decelerations: Absent Uterine activityNone Dilation: 1.5 Effacement (%): Thick Station: -3 Exam by:: [email or web address], rn   Prenatal labs: ABO, Rh: --/--/O POS (01/20 06-16-1989) Antibody: NEG (01/20 0836) Rubella: <0.90 (07/17 1211) RPR: NON REACTIVE (01/20 0827)  HBsAg: Negative (07/17 1211)  HIV: Non Reactive (10/19 0908)  GBS: --05-17-1991 (12/28 1430)  1 hr Glucola Normal Genetic screening  LR Anatomy 08-[birth year]-1978 Normla  Prenatal Transfer Tool  Maternal Diabetes: No Genetic Screening: Normal Maternal Ultrasounds/Referrals: Normal Fetal Ultrasounds or other Referrals:  None Maternal Substance Abuse:  No Significant Maternal Medications:  None Significant Maternal Lab Results:  Group B Strep positive Number of Prenatal Visits:greater than 3 verified prenatal visits Other Comments:  None  Results for orders placed or performed during the hospital encounter of 07/21/22 (from the past 24 hour(s))  CBC   Collection Time: 07/21/22  8:27 AM  Result Value Ref Range   WBC 8.4 4.0 - 10.5 K/uL    RBC 3.93 3.87 - 5.11 MIL/uL   Hemoglobin 12.4 12.0 - 15.0 g/dL   HCT 07/23/22 (L) 25.9 - 56.3 %   MCV 87.5 80.0 - 100.0 fL   MCH 31.6 26.0 - 34.0 pg   MCHC 36.0 30.0 - 36.0 g/dL   RDW 87.5 64.3 - 32.9 %   Platelets 178 150 - 400 K/uL   nRBC 0.0 0.0 - 0.2 %  RPR   Collection Time: 07/21/22  8:27 AM  Result Value Ref Range   RPR Ser Ql NON REACTIVE NON REACTIVE  Type and screen   Collection Time: 07/21/22  8:36 AM  Result Value Ref Range  ABO/RH(D) O POS    Antibody Screen NEG    Sample Expiration      07/24/2022,2359 Performed at [facility], [place] [address], [place], [place] [address]     Patient Active Problem List   Diagnosis Date Noted   Indication for care in labor or delivery 07/21/2022   Group B Streptococcus carrier, antepartum 07/04/2022   Obesity (BMI 30-39.9) 04/19/2022   UTI in pregnancy, antepartum, first trimester 01/22/2022   Thrombocytopenia affecting pregnancy, antepartum ([place]) 01/16/2022   Encounter for supervision of normal pregnancy, antepartum 01/03/2022   Generalized anxiety disorder 03/22/2020   Axillary adenopathy 03/22/2020   Rubella non-immune status, antepartum 04/26/2017    Assessment/Plan:  Marissa Friedman is a 27 y.o. [id number] at [redacted]w[redacted]d here for eIOL  #Labor:Initiate induction with FB and cytotec. Recheck in 4 hours  #Pain: Per patient request  #FWB: Cat 1 #ID:  GBS positive, clinda resistant, on treatment  #MOF: Formula #MOC:Vasectomy   Concepcion Living, MD  07/21/2022, 11:31 AM

## 2022-07-21 NOTE — Discharge Summary (Signed)
Postpartum Discharge Summary  Date of Service updated    Patient Name: Marissa Friedman DOB: 1996-02-01 MRN: [id number]  Date of admission: 07/21/2022 Delivery date:07/21/2022  Delivering provider: Shelda Pal  Date of discharge: 07/23/2022  Admitting diagnosis: Indication for care in labor or delivery [O75.9] Intrauterine pregnancy: [redacted]w[redacted]d     Secondary diagnosis:  Principal Problem:   Vaginal delivery Active Problems:   Rubella non-immune status, antepartum   Generalized anxiety disorder   Encounter for supervision of normal pregnancy, antepartum   Thrombocytopenia affecting pregnancy, antepartum ([place])   Obesity (BMI 30-39.9)   Group B Streptococcus carrier, antepartum  Additional problems: None    Discharge diagnosis: Term Pregnancy Delivered                                              Post partum procedures: None Augmentation: AROM, Pitocin, and Cytotec Complications: None  Hospital course: Induction of Labor With Vaginal Delivery   27 y.o. yo [id number] at [redacted]w[redacted]d was admitted to the hospital 07/21/2022 for induction of labor.  Indication for induction: Elective.  Patient had an labor course complicated by none. Membrane Rupture Time/Date: 3:32 PM ,07/21/2022   Delivery Method:Vaginal, Spontaneous  Episiotomy: None  Lacerations:  None  Details of delivery can be found in separate delivery note.  Patient had a postpartum course that was uncomplicated. Patient is discharged home 07/23/22.  Newborn Data: Birth date:07/21/2022  Birth time:8:46 PM  Gender:Female  Living status:Living  Apgars:9 ,9  Weight:3580 g   Magnesium Sulfate received: No BMZ received: No Rhophylac:N/A MMR: Recommended prior to discharge.  T-DaP: Recommended prior to discharge Flu: No Transfusion:No  Physical exam  Vitals:   07/22/22 0800 07/22/22 1200 07/22/22 1958 07/23/22 0543  BP: 108/65 115/69 118/69 102/67  Pulse: 66 68 70 79  Resp: 18 18 18 19   Temp: 98.1 F (36.7 C) 98.5  F (36.9 C)  98.3 F (36.8 C)  TempSrc: Oral Oral    SpO2: 100% 100% 100% 100%  Weight:      Height:       General: alert, cooperative, and no distress Lochia: appropriate Uterine Fundus: firm Incision: N/A DVT Evaluation: No evidence of DVT seen on physical exam. Labs: Lab Results  Component Value Date   WBC 10.1 07/22/2022   HGB 11.9 (L) 07/22/2022   HCT 33.9 (L) 07/22/2022   MCV 89.2 07/22/2022   PLT 157 07/22/2022      Latest Ref Rng & Units 06/21/2022   11:52 AM  CMP  Glucose 70 - 99 mg/dL 88   BUN 6 - 20 mg/dL 3   Creatinine 0.57 - 1.00 mg/dL 0.54   Sodium 134 - 144 mmol/L 138   Potassium 3.5 - 5.2 mmol/L 3.8   Chloride 96 - 106 mmol/L 104   CO2 20 - 29 mmol/L 19   Calcium 8.7 - 10.2 mg/dL 8.4   Total Protein 6.0 - 8.5 g/dL 5.8   Total Bilirubin 0.0 - 1.2 mg/dL 0.7   Alkaline Phos 44 - 121 IU/L 203   AST 0 - 40 IU/L 41   ALT 0 - 32 IU/L 17    Edinburgh Score:    07/22/2022    5:00 AM  Edinburgh Postnatal Depression Scale Screening Tool  I have been able to laugh and see the funny side of things. 0  I have looked forward with enjoyment  to things. 0  I have blamed myself unnecessarily when things went wrong. 0  I have been anxious or worried for no good reason. 0  I have felt scared or panicky for no good reason. 0  Things have been getting on top of me. 0  I have been so unhappy that I have had difficulty sleeping. 0  I have felt sad or miserable. 0  I have been so unhappy that I have been crying. 0  The thought of harming myself has occurred to me. 0  Edinburgh Postnatal Depression Scale Total 0     After visit meds:  Allergies as of 07/23/2022       Reactions   Penicillins Swelling   Childhood reaction Has patient had a PCN reaction causing immediate rash, facial/tongue/throat swelling, SOB or lightheadedness with hypotension: Unknown Has patient had a PCN reaction causing severe rash involving mucus membranes or skin necrosis: No Has patient  had a PCN reaction that required hospitalization: No Has patient had a PCN reaction occurring within the last 10 years: No If all of the above answers are "NO", then may proceed with Cephalosporin use.   Wellbutrin [bupropion] Swelling   Pt unsure of reaction.- facial swelling- childhood        Medication List     STOP taking these medications    predniSONE 10 MG tablet Commonly known as: DELTASONE       TAKE these medications    acetaminophen 325 MG tablet Commonly known as: Tylenol Take 2 tablets (650 mg total) by mouth every 4 (four) hours as needed (for pain scale < 4).   furosemide 20 MG tablet Commonly known as: LASIX Take 1 tablet (20 mg total) by mouth daily for 3 days. Start taking on: July 24, 2022   ibuprofen 600 MG tablet Commonly known as: ADVIL Take 1 tablet (600 mg total) by mouth every 6 (six) hours as needed for moderate pain or mild pain.   PRENATAL VITAMIN PO Take by mouth.         Discharge home in stable condition Infant Feeding: Bottle Infant Disposition:home with mother Discharge instruction: per After Visit Summary and Postpartum booklet. Activity: Advance as tolerated. Pelvic rest for 6 weeks.  Diet: routine diet Future Appointments: Future Appointments  Date Time Provider [place]  08/23/2022  2:10 PM Cresenzo-Dishmon, Joaquim Lai, CNM [facility]   Follow up Visit:  [place] [facility] [place] [facility] Follow up in 5 week(s).   Specialty: Obstetrics and Gynecology Contact information: [address] [place] [phone]               Message sent to FT 1/20  Please schedule this patient for a In person postpartum visit in 6 weeks with the following provider: Any provider. Additional Postpartum F/U:Postpartum Depression checkup  High risk pregnancy complicated by:  Thrombocytopenia, Anxiety, obesity Delivery mode:  Vaginal,  Spontaneous  Anticipated Birth Control:   planning vasectomy with condoms as the bridge.   07/23/2022 Radene Gunning, MD

## 2022-07-22 ENCOUNTER — Other Ambulatory Visit: Payer: Self-pay

## 2022-07-22 LAB — CBC
HCT: 33.9 % — ABNORMAL LOW (ref 36.0–46.0)
Hemoglobin: 11.9 g/dL — ABNORMAL LOW (ref 12.0–15.0)
MCH: 31.3 pg (ref 26.0–34.0)
MCHC: 35.1 g/dL (ref 30.0–36.0)
MCV: 89.2 fL (ref 80.0–100.0)
Platelets: 157 10*3/uL (ref 150–400)
RBC: 3.8 MIL/uL — ABNORMAL LOW (ref 3.87–5.11)
RDW: 15.1 % (ref 11.5–15.5)
WBC: 10.1 10*3/uL (ref 4.0–10.5)
nRBC: 0 % (ref 0.0–0.2)

## 2022-07-22 MED ORDER — FUROSEMIDE 20 MG PO TABS
20.0000 mg | ORAL_TABLET | Freq: Every day | ORAL | Status: DC
  Administered 2022-07-22 – 2022-07-23 (×2): 20 mg via ORAL
  Filled 2022-07-22 (×2): qty 1

## 2022-07-22 NOTE — Plan of Care (Signed)

## 2022-07-22 NOTE — Progress Notes (Signed)
CSW received consult for hx of Anxiety. CSW met with MOB to offer support and complete assessment. When CSW entered room, MOB was observed sitting in hospital bed, holding infant. Visitors entered room during consult. CSW introduced self and offered privacy. MOB provided verbal consent to complete consult with visitors present. MOB presented as calm, welcomed consult, and remained engaged.  CSW inquired how MOB is feeling emotionally since giving birth. MOB states she is feeling "good." CSW inquired about MOB's mental health history. MOB states that she was diagnosed with anxiety at age 10. MOB states she was prescribed Celexa prior to pregnancy but stopped taking the medication when pregnancy was confirmed. MOB recalls Celexa as helpful but is not interested in resuming psychotropic medication at this time. MOB shares she felt like her anxiety was "okay" during pregnancy and denied recent concerns. MOB is not current with a therapist and declined mental health resources at this time. MOB denied a history of postpartum depression/anxiety after having her other children. MOB identified FOB and her family as supports. MOB denied current SI/HI.  CSW provided education regarding the baby blues period vs. perinatal mood disorders, discussed treatment and gave resources for mental health follow up if concerns arise.  CSW recommends self-evaluation during the postpartum time period using the New Mom Checklist from Postpartum Progress and encouraged MOB to contact a medical professional if symptoms are noted at any time.    MOB reports she has all needed items for infant, including a car seat and bassinet. MOB has chosen [facility] in [place] for infant's follow up care. MOB denied additional resource needs.  CSW provided review of Sudden Infant Death Syndrome (SIDS) precautions.  CSW identifies no further need for intervention and no barriers to discharge at this time.  Signed,  Captain Blucher K. Kindel Rochefort, MSW, LCSWA,  LCASA 07/22/2022 4:08 PM 

## 2022-07-22 NOTE — Progress Notes (Signed)
Post Partum Day 1 Subjective: no complaints, up ad lib, voiding, and tolerating PO  Objective: Blood pressure (!) 106/52, pulse 68, temperature 98.4 F (36.9 C), temperature source Oral, resp. rate 18, height 5\' 3"  (1.6 m), weight 99.6 kg, last menstrual period 10/16/2021, SpO2 100 %, unknown if currently breastfeeding.  Physical Exam:  General: alert, cooperative, and appears stated age 27: appropriate Uterine Fundus: firm DVT Evaluation: No evidence of DVT seen on physical exam.  Recent Labs    07/21/22 0827 07/22/22 0404  HGB 12.4 11.9*  HCT 34.4* 33.9*    Assessment/Plan: Plan for discharge tomorrow and Contraception vasectomy Formula feeding Lasix for borderline BPs   LOS: 1 day   Marissa Jude, MD 07/22/2022, 9:02 AM

## 2022-07-22 NOTE — Anesthesia Postprocedure Evaluation (Signed)
Anesthesia Post Note  Patient: [name]  Procedure(s) Performed: AN AD HOC LABOR EPIDURAL     Patient location during evaluation: Mother Baby Anesthesia Type: Epidural Level of consciousness: awake and alert and oriented Pain management: satisfactory to patient Vital Signs Assessment: post-procedure vital signs reviewed and stable Respiratory status: respiratory function stable Cardiovascular status: stable Postop Assessment: no headache, no backache, epidural receding, patient able to bend at knees, no signs of nausea or vomiting, adequate PO intake and able to ambulate Anesthetic complications: no   No notable events documented.  Last Vitals:  Vitals:   07/21/22 2356 07/22/22 0350  BP: 126/70 (!) 106/52  Pulse: 82 68  Resp: 18 18  Temp: 36.7 C 36.9 C  SpO2: 99% 100%    Last Pain:  Vitals:   07/22/22 0350  TempSrc: Oral  PainSc: 0-No pain   Pain Goal:                   Marissa Friedman

## 2022-07-23 ENCOUNTER — Other Ambulatory Visit (HOSPITAL_COMMUNITY): Payer: Self-pay

## 2022-07-23 MED ORDER — ACETAMINOPHEN 325 MG PO TABS: 650.0000 mg | ORAL_TABLET | ORAL | Status: AC | PRN

## 2022-07-23 MED ORDER — FUROSEMIDE 20 MG PO TABS
20.0000 mg | ORAL_TABLET | Freq: Every day | ORAL | 0 refills | Status: AC
  Filled 2022-07-23: qty 3, 3d supply, fill #0

## 2022-07-23 MED ORDER — IBUPROFEN 600 MG PO TABS: 600.0000 mg | ORAL_TABLET | Freq: Four times a day (QID) | ORAL | 0 refills | Status: AC | PRN

## 2022-07-28 ENCOUNTER — Telehealth (HOSPITAL_COMMUNITY): Payer: Self-pay

## 2022-07-28 NOTE — Telephone Encounter (Signed)
Patient reports feeling good. Patient declines questions/concerns about her health and healing.  Patient reports that baby is doing well. Baby sleeps in a bassinet. RN reviewed ABC's of safe sleep with patient. Patient declines any questions or concerns about baby.  EPDS score is 0.  Sharyn Lull [facility] 07/28/22,1717

## 2022-08-23 ENCOUNTER — Telehealth: Payer: Medicaid Other | Admitting: Advanced Practice Midwife

## 2022-08-28 ENCOUNTER — Telehealth (INDEPENDENT_AMBULATORY_CARE_PROVIDER_SITE_OTHER): Payer: Medicaid Other | Admitting: Women's Health

## 2022-08-28 ENCOUNTER — Encounter: Payer: Self-pay | Admitting: Women's Health

## 2022-08-28 DIAGNOSIS — Z30011 Encounter for initial prescription of contraceptive pills: Secondary | ICD-10-CM

## 2022-08-28 MED ORDER — NORETHINDRONE 0.35 MG PO TABS: 1.0000 | ORAL_TABLET | Freq: Every day | ORAL | 3 refills | Status: DC

## 2022-08-28 NOTE — Progress Notes (Signed)
TELEHEALTH VIRTUAL POSTPARTUM VISIT ENCOUNTER NOTE Patient name: Marissa Friedman MRN [id number]  Date of birth: April 07, 1996  I connected with patient on 08/28/22 at  1:30 PM EST by MyChart video  and verified that I am speaking with the correct person using two identifiers. Pt is not currently in our office, she is at home.  The provider is in the office.    I discussed the limitations, risks, security and privacy concerns of performing an evaluation and management service by telephone and the availability of in person appointments. I also discussed with the patient that there may be a patient responsible charge related to this service. The patient expressed understanding and agreed to proceed.  Chief Complaint:   Postpartum Care (Talk about birth control)  History of Present Illness:   Marissa Friedman is a 27 y.o. [id number] Caucasian female being seen today for a postpartum visit. She is 5 weeks postpartum following a spontaneous vaginal delivery at 39.5 gestational weeks. IOL: yes, for elective. Anesthesia: epidural.  Laceration: none.  Complications: none. Inpatient contraception: no.   Pregnancy complicated by thrombocytopenia, last platelet count was normal (157) while in hospital . Tobacco use: former . Substance use disorder: no. Last pap smear: 01/16/22 and results were NILM w/ HRHPV not done. Next pap smear due: 2026 Patient's last menstrual period was 08/24/2022.  Postpartum course has been uncomplicated. Was given lasix for borderline bp's-all documented look normal. States nurse told her there was 1 elevated during labor. Was never dx w/ GHTN or pre-e. Bleeding none, has had a period. Bowel function is normal. Bladder function is normal. Urinary incontinence? no, fecal incontinence? no Patient is not sexually active. Last sexual activity: prior to birth of baby. Desired contraception: POPs, husband plans vasectomy next month. Patient does not want a pregnancy in the future.  Desired  family size is 5 children.   Upstream - 08/28/22 1344       Pregnancy Intention Screening   Does the patient want to become pregnant in the next year? No    Does the patient's partner want to become pregnant in the next year? No    Would the patient like to discuss contraceptive options today? Yes      Contraception Wrap Up   Current Method Abstinence    End Method Abstinence    Contraception Counseling Provided Yes            The pregnancy intention screening data noted above was reviewed. Potential methods of contraception were discussed. The patient elected to proceed with Abstinence.  Edinburgh Postpartum Depression Screening: negative  Edinburgh Postnatal Depression Scale - 08/28/22 1340       Edinburgh Postnatal Depression Scale:  In the Past 7 Days   I have been able to laugh and see the funny side of things. 0    I have looked forward with enjoyment to things. 0    I have blamed myself unnecessarily when things went wrong. 0    I have been anxious or worried for no good reason. 0    I have felt scared or panicky for no good reason. 0    Things have been getting on top of me. 0    I have been so unhappy that I have had difficulty sleeping. 0    I have felt sad or miserable. 0    I have been so unhappy that I have been crying. 0    The thought of harming myself has occurred to me.  0    Edinburgh Postnatal Depression Scale Total 0            Baby's course has been uncomplicated. Baby is feeding by breast and bottle: milk supply inadequate . Infant has a pediatrician/family doctor? Yes.  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  Pt has material needs met for her and baby: Yes.   Review of Systems:   Pertinent items are noted in HPI Denies Abnormal vaginal discharge w/ itching/odor/irritation, headaches, visual changes, shortness of breath, chest pain, abdominal pain, severe nausea/vomiting, or problems with urination or bowel  movements. Pertinent History Reviewed:  Reviewed past medical,surgical, obstetrical and family history.  Reviewed problem list, medications and allergies. OB History  Gravida Para Term Preterm AB Living  '5 5 5     5  '$ SAB IAB Ectopic Multiple Live Births        0 5    # Outcome Date GA Lbr Len/2nd Weight Sex Delivery Anes PTL Lv  5 Term 07/21/22 68w5d09:09 / 00:21 7 lb 14.3 oz (3.58 kg) F Vag-Spont EPI  LIV  4 Term 02/10/19 422w1d6:06 / 00:14 7 lb 15 oz (3.6 kg) F Vag-Spont EPI  LIV  3 Term 11/18/17 4175w0d:00 / 00:26 8 lb (3.63 kg) F Vag-Spont EPI  LIV  2 Term 03/23/15 40w55w5dlb 15 oz (4.054 kg) M Vag-Spont EPI N LIV  1 Term 02/06/13 40w265w2db 15 oz (3.6 kg) F Vag-Spont EPI N LIV     Birth Comments: System Generated. Please review and update pregnancy details.    Obstetric Comments  [facility]   Physical Assessment:   Vitals:   08/28/22 1334  BP: 126/84  Pulse: 74  Weight: 200 lb (90.7 kg)  Height: '5\' 3"'$  (1.6 m)  Body mass index is 35.43 kg/m.         Physical Examination:   General:  Alert, oriented and cooperative.   Mental Status: Normal mood and affect perceived.   Rest of physical exam deferred due to type of encounter       No results found for this or any previous visit (from the past 24 hour(s)).  Assessment & Plan:  1) Postpartum exam 2) 5 wks s/p spontaneous vaginal delivery 3) breast & bottle feeding> tips given to increase supply 4) Depression screening 5) Contraception management: wants POPs until husband cleared from vasectomy, declines Slynd- wants micronor. Rx sent. Condoms x 2wks  Essential components of care per ACOG recommendations:  1.  Mood and well being:  If positive depression screen, discussed and plan developed.  If using tobacco we discussed reduction/cessation and risk of relapse If current substance abuse, we discussed and referral to local resources was offered.   2. Infant care and feeding:  If breastfeeding,  discussed returning to work, pumping, breastfeeding-associated pain, guidance regarding return to fertility while lactating if not using another method. If needed, patient was provided with a letter to be allowed to pump q 2-3hrs to support lactation in a private location with access to a refrigerator to store breastmilk.   Recommended that all caregivers be immunized for flu, pertussis and other preventable communicable diseases If pt does not have material needs met for her/baby, referred to local resources for help obtaining these.  3. Sexuality, contraception and birth spacing Provided guidance regarding sexuality, management of dyspareunia, and resumption of intercourse Discussed avoiding interpregnancy interval <6mths75md recommended birth spacing of 18 months  4.  Sleep and fatigue Discussed coping options for fatigue and sleep disruption Encouraged family/partner/community support of 4 hrs of uninterrupted sleep to help with mood and fatigue  5. Physical recovery  If pt had a C/S, assessed incisional pain and providing guidance on normal vs prolonged recovery If pt had a laceration, perineal healing and pain reviewed.  If urinary or fecal incontinence, discussed management and referred to PT or uro/gyn if indicated  Patient is safe to resume physical activity. Discussed attainment of healthy weight.  6.  Chronic disease management Discussed pregnancy complications if any, and their implications for future childbearing and long-term maternal health. Review recommendations for prevention of recurrent pregnancy complications, such as 17 hydroxyprogesterone caproate to reduce risk for recurrent PTB not applicable, or aspirin to reduce risk of preeclampsia not applicable. Pt had GDM: No. If yes, 2hr GTT scheduled: not applicable. Reviewed medications and non-pregnant dosing including consideration of whether pt is breastfeeding using a reliable resource such as LactMed: yes Referred for f/u  w/ PCP or subspecialist providers as indicated: not applicable  7. Health maintenance Mammogram at 29yo or earlier if indicated Pap smears as indicated  Meds:  Meds ordered this encounter  Medications   norethindrone (MICRONOR) 0.35 MG tablet    Sig: Take 1 tablet (0.35 mg total) by mouth daily.    Dispense:  90 tablet    Refill:  3    Follow-up: Return in about 1 year (around 08/29/2023) for Physical.   No orders of the defined types were placed in this encounter.    I provided 15 minutes of non-face-to-face time during this encounter.  [place], [facility] 08/28/2022 2:05 PM

## 2022-08-28 NOTE — Patient Instructions (Signed)
Tips To Increase Milk Supply Lots of water! Enough so that your urine is clear Plenty of calories, if you're not getting enough calories, your milk supply can decrease Breastfeed/pump often, every 2-3 hours x 20-2mns Fenugreek 3 pills 3 times a day, this may make your urine smell like maple syrup Mother's Milk Tea Lactation cookies, google for the recipe Real oatmeal Body Armor sports drinks Liquid Gold Greater Than hydration drink

## 2022-09-03 ENCOUNTER — Encounter: Payer: Self-pay | Admitting: Women's Health

## 2022-10-11 ENCOUNTER — Other Ambulatory Visit (HOSPITAL_BASED_OUTPATIENT_CLINIC_OR_DEPARTMENT_OTHER): Payer: Self-pay

## 2022-10-24 ENCOUNTER — Ambulatory Visit: Payer: Medicaid Other | Admitting: Nurse Practitioner

## 2023-02-13 ENCOUNTER — Encounter: Payer: Self-pay | Admitting: Women's Health

## 2023-02-15 ENCOUNTER — Ambulatory Visit: Payer: Medicaid Other | Admitting: Advanced Practice Midwife

## 2023-03-01 ENCOUNTER — Encounter: Payer: Self-pay | Admitting: Obstetrics & Gynecology

## 2023-03-01 ENCOUNTER — Ambulatory Visit: Payer: Medicaid Other | Admitting: Obstetrics & Gynecology

## 2023-03-01 VITALS — BP 131/79 | HR 68 | Ht 63.0 in | Wt 198.6 lb

## 2023-03-01 DIAGNOSIS — Z30430 Encounter for insertion of intrauterine contraceptive device: Secondary | ICD-10-CM | POA: Diagnosis not present

## 2023-03-01 DIAGNOSIS — Z3009 Encounter for other general counseling and advice on contraception: Secondary | ICD-10-CM

## 2023-03-01 MED ORDER — PARAGARD INTRAUTERINE COPPER IU IUD
1.0000 | INTRAUTERINE_SYSTEM | Freq: Once | INTRAUTERINE | Status: AC
  Administered 2023-03-01: 1 via INTRAUTERINE

## 2023-03-01 NOTE — Progress Notes (Signed)
   GYN VISIT Patient name: Marissa Friedman MRN [id number]  Date of birth: April 24, 1996 Chief Complaint:   Contraception  History of Present Illness:   Marissa Friedman is a 27 y.o. [id number] female being seen today for contraceptive management- Paragard insertion..     Tried pills, Depot and nexplanon- noted significant mood changes.  Menses are "regular" typically monthly last for 6 days- 2 heavy days- going through about 4-5 tampons per day.  Denies intermenstrual bleeding. Denies dysmenorrhea.  No new partners.    At this point, she desires a non-hormonal LARC  Patient's last menstrual period was 02/15/2023 (approximate).    Review of Systems:   Pertinent items are noted in HPI Denies fever/chills, dizziness, headaches, visual disturbances, fatigue, shortness of breath, chest pain, abdominal pain, vomiting, no problems with periods, bowel movements, urination, or intercourse unless otherwise stated above.  Pertinent History Reviewed:   Past Surgical History:  Procedure Laterality Date   NO PAST SURGERIES      Past Medical History:  Diagnosis Date   Anxiety    Gestational thrombocytopenia (HCC)    Reviewed problem list, medications and allergies. Physical Assessment:   Vitals:   03/01/23 1038  BP: 131/79  Pulse: 68  Weight: 198 lb 9.6 oz (90.1 kg)  Height: 5\' 3"  (1.6 m)  Body mass index is 35.18 kg/m.       Physical Examination:   General appearance: alert, well appearing, and in no distress  Psych: mood appropriate, normal affect  Skin: warm & dry   Cardiovascular: normal heart rate noted  Respiratory: normal respiratory effort, no distress  Abdomen: soft, non-tender   Pelvic: VULVA: normal appearing vulva with no masses, tenderness or lesions, VAGINA: normal appearing vagina with normal color and discharge, no lesions, CERVIX: normal appearing cervix without discharge or lesions  Extremities: no edema   Chaperone: Faith Rogue    IUD INSERTION   The  risks and benefits of the method and placement have been thouroughly reviewed with the patient and all questions were answered.  Specifically the patient is aware of failure rate of 07/998, expulsion of the IUD and of possible perforation.  The patient is aware of irregular bleeding due to the method and understands the incidence of irregular bleeding diminishes with time.  Signed copy of informed consent in chart.    A sterile speculum was placed in the vagina.  The cervix was visualized, prepped using Betadine, and grasped with a single tooth tenaculum. The uterus was sounded to 9 cm.  Paragard  IUD placed per manufacturer's recommendations. The strings were trimmed to approximately 3 cm. The patient tolerated the procedure well.    Chaperone: Faith Rogue    Assessment & Plan:   -Paragard IUD insertion The patient was given post procedure instructions, including signs and symptoms of infection and to check for the strings after each menses or each month, and refraining from intercourse or anything in the vagina for 3 days. She was given a care card with date IUD placed, and date IUD to be removed. She is scheduled for a f/u appointment in  6- 8 weeks.   Orders Placed This Encounter  Procedures   POCT urine pregnancy    Return in about 6 weeks (around 04/12/2023) for 6-8wk IUD check.   Myna Hidalgo, DO Attending Obstetrician & Gynecologist, [facility] for [employer], [facility]

## 2023-04-12 ENCOUNTER — Ambulatory Visit: Payer: Medicaid Other | Admitting: Obstetrics & Gynecology

## 2023-07-18 ENCOUNTER — Encounter: Payer: Self-pay | Admitting: Adult Health

## 2023-07-18 ENCOUNTER — Ambulatory Visit: Payer: Medicaid Other | Admitting: Adult Health

## 2023-07-18 VITALS — BP 114/72 | HR 61 | Ht 63.0 in | Wt 197.0 lb

## 2023-07-18 DIAGNOSIS — Z30431 Encounter for routine checking of intrauterine contraceptive device: Secondary | ICD-10-CM

## 2023-07-18 NOTE — Progress Notes (Signed)
  Subjective:     Patient ID: Marissa Friedman, female   DOB: [birth year], 28 y.o.   MRN: [id number]  HPI [name] is a 28 year old white female, married, [id number] in for IUD check. Has ParaGard, has periods.     Component Value Date/Time   DIAGPAP  01/16/2022 1021    - Negative for intraepithelial lesion or malignancy (NILM)   DIAGPAP  09/08/2018 0000    NEGATIVE FOR INTRAEPITHELIAL LESIONS OR MALIGNANCY.   DIAGPAP  09/08/2018 0000    FUNGAL ORGANISMS PRESENT CONSISTENT WITH CANDIDA SPP.   ADEQPAP  01/16/2022 1021    Satisfactory for evaluation; transformation zone component PRESENT.   ADEQPAP  09/08/2018 0000    Satisfactory for evaluation  endocervical/transformation zone component PRESENT.     Review of Systems For IUD check Has periods, can't feel string Reviewed past medical,surgical, social and family history. Reviewed medications and allergies.     Objective:   Physical Exam BP 114/72 (BP Location: Left Arm, Patient Position: Sitting, Cuff Size: Normal)   Pulse 61   Ht 5\' 3"  (1.6 m)   Wt 197 lb (89.4 kg)   LMP 07/07/2023 (Approximate)   Breastfeeding No   BMI 34.90 kg/m     Skin warm and dry.Pelvic: external genitalia is normal in appearance no lesions, vagina: pink, mucous discharge, urethra has no lesions or masses noted, cervix:smooth, +strings at os, uterus: normal size, shape and contour, non tender, no masses felt, adnexa: no masses or tenderness noted. Bladder is non tender and no masses felt.  Fall risk is low  Upstream - 07/18/23 1016       Pregnancy Intention Screening   Does the patient want to become pregnant in the next year? No    Does the patient's partner want to become pregnant in the next year? No    Would the patient like to discuss contraceptive options today? No      Contraception Wrap Up   Current Method IUD or IUS    End Method IUD or IUS    Contraception Counseling Provided Yes            Examination chaperoned by Malachy Mood  LPN  Assessment:     1. IUD check up (Primary) ParaGard placed 03/01/23 +strings at os     Plan:     Return in 6 months for physical and labs

## 2024-06-14 ENCOUNTER — Other Ambulatory Visit: Payer: Self-pay

## 2024-06-14 ENCOUNTER — Encounter: Payer: Self-pay | Admitting: Emergency Medicine

## 2024-06-14 ENCOUNTER — Ambulatory Visit: Admission: EM | Admit: 2024-06-14 | Discharge: 2024-06-14 | Disposition: A | Discharge status: Home / Self Care

## 2024-06-14 DIAGNOSIS — J069 Acute upper respiratory infection, unspecified: Secondary | ICD-10-CM

## 2024-06-14 DIAGNOSIS — J029 Acute pharyngitis, unspecified: Secondary | ICD-10-CM

## 2024-06-14 LAB — POCT RAPID STREP A (OFFICE): Rapid Strep A Screen: NEGATIVE

## 2024-06-14 MED ORDER — AZELASTINE HCL 0.1 % NA SOLN: 1.0000 | Freq: Two times a day (BID) | NASAL | 0 refills | Status: AC

## 2024-06-14 MED ORDER — LIDOCAINE VISCOUS HCL 2 % MT SOLN: 10.0000 mL | OROMUCOSAL | 0 refills | Status: AC | PRN

## 2024-06-14 NOTE — ED Triage Notes (Signed)
 Pt reports sore throat, right ear pain, headache. Denies any known fevers. Has tried ibuprofen  with some relief of symptoms but reports discomfort returns.

## 2024-06-14 NOTE — ED Provider Notes (Signed)
 [facility]    CSN: [id number] Arrival date & time: 06/14/24  1426      History   Chief Complaint Chief Complaint  Patient presents with   Sore Throat    HPI Marissa MONSIVAIS is a 28 y.o. female.   Patient presenting today with 3-day history of sore throat, right ear pain, headache, fatigue.  Denies fevers, congestion, cough, chest pain, shortness of breath, abdominal pain, vomiting, diarrhea.  Trying ibuprofen  as needed with mild relief but notes symptoms returned shortly after it wears off.  No known sick contacts recently.    Past Medical History:  Diagnosis Date   Anxiety    Gestational thrombocytopenia     Patient Active Problem List   Diagnosis Date Noted   Obesity (BMI 30-39.9) 04/19/2022   Thrombocytopenia affecting pregnancy, antepartum 01/16/2022   Generalized anxiety disorder 03/22/2020   Axillary adenopathy 03/22/2020   Rubella non-immune status, antepartum 04/26/2017    Past Surgical History:  Procedure Laterality Date   NO PAST SURGERIES      OB History     Gravida  5   Para  5   Term  5   Preterm      AB      Living  5      SAB      IAB      Ectopic      Multiple  0   Live Births  5        Obstetric Comments  [facility]          Home Medications    Prior to Admission medications  Medication Sig Start Date End Date Taking? Authorizing Provider  azelastine  (ASTELIN ) 0.1 % nasal spray Place 1 spray into both nostrils 2 (two) times daily. Use in each nostril as directed 06/14/24  Yes Stuart Vernell Norris, PA-C  FLUoxetine (PROZAC) 20 MG capsule Take 20 mg by mouth every morning. 06/12/24  Yes [provider]  lidocaine  (XYLOCAINE ) 2 % solution Use as directed 10 mLs in the mouth or throat every 3 (three) hours as needed. 06/14/24  Yes Stuart Vernell Norris, PA-C  acetaminophen  (TYLENOL ) 325 MG tablet Take 2 tablets (650 mg total) by mouth every 4 (four) hours as needed (for pain  scale < 4). 07/23/22   Cleatus Moccasin, MD  furosemide  (LASIX ) 20 MG tablet Take 1 tablet (20 mg total) by mouth daily for 3 days. 07/24/22 07/27/22  Cleatus Moccasin, MD  ibuprofen  (ADVIL ) 600 MG tablet Take 1 tablet (600 mg total) by mouth every 6 (six) hours as needed for moderate pain or mild pain. 07/23/22   Cleatus Moccasin, MD  PARAGARD  INTRAUTERINE COPPER  IU by Intrauterine route.    [provider]  fluticasone  (FLONASE ) 50 MCG/ACT nasal spray Place 1 spray into both nostrils daily for 14 days. 01/17/20 03/09/20  Avegno, Komlanvi S, FNP    Family History Family History  Problem Relation Age of Onset   Hypertension Paternal Grandmother    Heart disease Maternal Grandfather    Healthy Father    Asthma Mother    Healthy Mother    Stroke Other    Coronary artery disease Other     Social History Social History[1]   Allergies   Penicillins and Wellbutrin [bupropion]   Review of Systems Review of Systems Per HPI  Physical Exam Triage Vital Signs ED Triage Vitals  Encounter Vitals Group     BP 06/14/24 1519 113/80     Girls  Systolic BP Percentile --      Girls Diastolic BP Percentile --      Boys Systolic BP Percentile --      Boys Diastolic BP Percentile --      Pulse Rate 06/14/24 1519 68     Resp 06/14/24 1519 20     Temp 06/14/24 1519 98.4 F (36.9 C)     Temp Source 06/14/24 1519 Oral     SpO2 06/14/24 1519 98 %     Weight --      Height --      Head Circumference --      Peak Flow --      Pain Score 06/14/24 1517 3     Pain Loc --      Pain Education --      Exclude from Growth Chart --    No data found.  Updated Vital Signs BP 113/80 (BP Location: Right Arm)   Pulse 68   Temp 98.4 F (36.9 C) (Oral)   Resp 20   LMP 05/31/2024   SpO2 98%   Breastfeeding No   Visual Acuity Right Eye Distance:   Left Eye Distance:   Bilateral Distance:    Right Eye Near:   Left Eye Near:    Bilateral Near:     Physical Exam Vitals and nursing note reviewed.   Constitutional:      Appearance: Normal appearance.  HENT:     Head: Atraumatic.     Right Ear: Tympanic membrane and external ear normal.     Left Ear: Tympanic membrane and external ear normal.     Nose: Nose normal.     Mouth/Throat:     Mouth: Mucous membranes are moist.     Pharynx: Posterior oropharyngeal erythema present. No oropharyngeal exudate.  Eyes:     Extraocular Movements: Extraocular movements intact.     Conjunctiva/sclera: Conjunctivae normal.  Cardiovascular:     Rate and Rhythm: Normal rate and regular rhythm.     Heart sounds: Normal heart sounds.  Pulmonary:     Effort: Pulmonary effort is normal.     Breath sounds: Normal breath sounds. No wheezing or rales.  Musculoskeletal:        General: Normal range of motion.     Cervical back: Normal range of motion and neck supple.  Lymphadenopathy:     Cervical: No cervical adenopathy.  Skin:    General: Skin is warm and dry.  Neurological:     Mental Status: She is alert and oriented to person, place, and time.  Psychiatric:        Mood and Affect: Mood normal.        Thought Content: Thought content normal.      UC Treatments / Results  Labs (all labs ordered are listed, but only abnormal results are displayed) Labs Reviewed  CULTURE, GROUP A STREP [facility])  POCT RAPID STREP A (OFFICE)    EKG   Radiology No results found.  Procedures Procedures (including critical care time)  Medications Ordered in UC Medications - No data to display  Initial Impression / Assessment and Plan / UC Course  I have reviewed the triage vital signs and the nursing notes.  Pertinent labs & imaging results that were available during my care of the patient were reviewed by me and considered in my medical decision making (see chart for details).     Vitals and exam very reassuring today, rapid strep negative, throat culture pending.  Suspect viral  respiratory infection.  Treat with viscous lidocaine , Astelin  nasal  spray for ear pain, supportive over-the-counter medications and home care.  Return for worsening or unresolving symptoms.  Final Clinical Impressions(s) / UC Diagnoses   Final diagnoses:  Sore throat  Viral URI   Discharge Instructions   None    ED Prescriptions     Medication Sig Dispense Auth. Provider   lidocaine  (XYLOCAINE ) 2 % solution Use as directed 10 mLs in the mouth or throat every 3 (three) hours as needed. 100 mL Stuart Vernell Norris, PA-C   azelastine  (ASTELIN ) 0.1 % nasal spray Place 1 spray into both nostrils 2 (two) times daily. Use in each nostril as directed 30 mL Stuart Vernell Norris, PA-C      PDMP not reviewed this encounter.    [1]  Social History Tobacco Use   Smoking status: Former    Current packs/day: 0.00    Types: Cigarettes   Smokeless tobacco: Never  Vaping Use   Vaping status: Never Used  Substance Use Topics   Alcohol use: Not Currently   Drug use: No     Stuart Vernell Norris, PA-C 06/14/24 1608

## 2024-06-16 ENCOUNTER — Ambulatory Visit: Admitting: Women's Health

## 2024-07-29 ENCOUNTER — Ambulatory Visit: Admitting: Women's Health
